# Patient Record
Sex: Female | Born: 1945
Health system: Southern US, Community
[De-identification: ages and names within clinical notes are randomized; demographics above are authoritative.]

## PROBLEM LIST (undated history)

## (undated) DIAGNOSIS — G4730 Sleep apnea, unspecified: Secondary | ICD-10-CM

## (undated) DIAGNOSIS — J189 Pneumonia, unspecified organism: Secondary | ICD-10-CM

## (undated) DIAGNOSIS — C50919 Malignant neoplasm of unspecified site of unspecified female breast: Secondary | ICD-10-CM

## (undated) DIAGNOSIS — F419 Anxiety disorder, unspecified: Secondary | ICD-10-CM

## (undated) DIAGNOSIS — I10 Essential (primary) hypertension: Secondary | ICD-10-CM

## (undated) DIAGNOSIS — J449 Chronic obstructive pulmonary disease, unspecified: Secondary | ICD-10-CM

## (undated) DIAGNOSIS — M1990 Unspecified osteoarthritis, unspecified site: Secondary | ICD-10-CM

## (undated) DIAGNOSIS — C50912 Malignant neoplasm of unspecified site of left female breast: Secondary | ICD-10-CM

## (undated) HISTORY — PX: VASCULAR SURGERY: SHX849

## (undated) HISTORY — PX: TRACHEOSTOMY: SUR1362

## (undated) HISTORY — PX: CHOLECYSTECTOMY: SHX55

## (undated) HISTORY — DX: Malignant neoplasm of unspecified site of unspecified female breast: C50.919

## (undated) HISTORY — PX: BREAST SURGERY: SHX581

## (undated) HISTORY — DX: Anxiety disorder, unspecified: F41.9

## (undated) HISTORY — DX: Essential (primary) hypertension: I10

## (undated) HISTORY — PX: ABDOMINAL HYSTERECTOMY: SHX81

## (undated) HISTORY — DX: Sleep apnea, unspecified: G47.30

## (undated) HISTORY — PX: OTHER SURGICAL HISTORY: SHX169

## (undated) HISTORY — DX: Malignant neoplasm of unspecified site of left female breast: C50.912

---

## 1999-02-28 ENCOUNTER — Ambulatory Visit (HOSPITAL_COMMUNITY): Admission: RE | Admit: 1999-02-28 | Discharge: 1999-02-28 | Payer: Self-pay | Admitting: Family Medicine

## 2000-08-28 ENCOUNTER — Ambulatory Visit (HOSPITAL_COMMUNITY): Admission: RE | Admit: 2000-08-28 | Discharge: 2000-08-28 | Payer: Self-pay | Admitting: *Deleted

## 2001-05-17 ENCOUNTER — Emergency Department (HOSPITAL_COMMUNITY): Admission: EM | Admit: 2001-05-17 | Discharge: 2001-05-17 | Payer: Self-pay | Admitting: Emergency Medicine

## 2002-01-13 ENCOUNTER — Encounter: Payer: Self-pay | Admitting: Emergency Medicine

## 2002-01-13 ENCOUNTER — Emergency Department (HOSPITAL_COMMUNITY): Admission: EM | Admit: 2002-01-13 | Discharge: 2002-01-13 | Payer: Self-pay | Admitting: Emergency Medicine

## 2002-01-20 ENCOUNTER — Emergency Department (HOSPITAL_COMMUNITY): Admission: EM | Admit: 2002-01-20 | Discharge: 2002-01-20 | Payer: Self-pay | Admitting: Emergency Medicine

## 2002-01-20 ENCOUNTER — Encounter: Payer: Self-pay | Admitting: Emergency Medicine

## 2002-06-24 ENCOUNTER — Emergency Department (HOSPITAL_COMMUNITY): Admission: EM | Admit: 2002-06-24 | Discharge: 2002-06-24 | Payer: Self-pay | Admitting: Emergency Medicine

## 2002-09-30 ENCOUNTER — Ambulatory Visit (HOSPITAL_COMMUNITY): Admission: RE | Admit: 2002-09-30 | Discharge: 2002-09-30 | Payer: Self-pay | Admitting: *Deleted

## 2004-07-17 ENCOUNTER — Other Ambulatory Visit: Admission: RE | Admit: 2004-07-17 | Discharge: 2004-07-17 | Payer: Self-pay | Admitting: Gynecology

## 2004-09-06 ENCOUNTER — Inpatient Hospital Stay (HOSPITAL_COMMUNITY): Admission: RE | Admit: 2004-09-06 | Discharge: 2004-09-08 | Payer: Self-pay | Admitting: Gynecology

## 2004-09-06 ENCOUNTER — Encounter (INDEPENDENT_AMBULATORY_CARE_PROVIDER_SITE_OTHER): Payer: Self-pay | Admitting: Specialist

## 2005-10-23 ENCOUNTER — Ambulatory Visit (HOSPITAL_COMMUNITY): Admission: RE | Admit: 2005-10-23 | Discharge: 2005-10-23 | Payer: Self-pay | Admitting: Internal Medicine

## 2006-04-03 ENCOUNTER — Other Ambulatory Visit: Admission: RE | Admit: 2006-04-03 | Discharge: 2006-04-03 | Payer: Self-pay | Admitting: Gynecology

## 2006-10-24 ENCOUNTER — Ambulatory Visit (HOSPITAL_COMMUNITY): Admission: RE | Admit: 2006-10-24 | Discharge: 2006-10-24 | Payer: Self-pay | Admitting: Internal Medicine

## 2007-11-05 ENCOUNTER — Ambulatory Visit (HOSPITAL_COMMUNITY): Admission: RE | Admit: 2007-11-05 | Discharge: 2007-11-05 | Payer: Self-pay | Admitting: Internal Medicine

## 2010-03-09 ENCOUNTER — Other Ambulatory Visit: Admission: RE | Admit: 2010-03-09 | Discharge: 2010-03-09 | Payer: Self-pay | Admitting: Internal Medicine

## 2011-03-02 NOTE — Discharge Summary (Signed)
NAMEJAHNI, PAUL               ACCOUNT NO.:  [id number]   MEDICAL RECORD NO.:  [id number]          PATIENT TYPE:  INP   LOCATION:  9317                          FACILITY:  [facility]   PHYSICIAN:  Ivor Costa. Farrel Gobble, M.D. DATE OF BIRTH:  Feb 13, 1946   DATE OF ADMISSION:  09/06/2004  DATE OF DISCHARGE:  09/08/2004                                 DISCHARGE SUMMARY   PRINCIPAL DIAGNOSIS:  Postmenopausal bleeding with endometrial defect.   ADDITIONAL DIAGNOSES:  Complex adnexal mass, cystocele, rectocele and  chronic hypertension.   PRINCIPAL PROCEDURE:  Laparoscopic assisted vaginal hysterectomy with  bilateral salpingo-oophorectomy.   ADDITIONAL PROCEDURES:  Anterior colporrhaphy, posterior colporrhaphy.   HOSPITAL COURSE:  Refer to dictated H&P for hospital course. The patient  presented on the morning of November 23 and underwent a laparoscopically  assisted vaginal hysterectomy with BSO, AP repair under general anesthesia  with an approximate estimated blood loss of 150 mL.  Operative findings was  a clear left ovarian mass that was mobile, an atrophic right ovary, slightly  enlarged uterus with smooth contours. The patient did well operatively,  extubated in the OR, transferred to the PACU in stable condition.  Her  postoperative course was uncomplicated. Because of patient desire, she was  started on food immediately, initially had some nausea but by postoperative  day #1, she was able to tolerate a regular diet.  By postoperative day #2,  the patient was ambulating without any difficulty. She had had scant to no  vaginal bleeding, she was tolerating a regular diet, her pain was well  controlled with oral pain medications and she was discharged home. The  patient was discharged home with instructions to followup in the office in  one week for a blood pressure check as her blood pressures did remain normal  while she was in house and she had never resumed her blood pressure  medication.   POSTOPERATIVE LABS:  Her hemoglobin was 10.8, hematocrit 32, platelets of  315 and white count of 18.5.   ADDITIONAL PRESCRIPTION:  She was given a prescription for Ambien 10 mg at  h.s. p.r.n. and she had been given a prescription for Tylox preoperatively.     Trac   THL/MEDQ  D:  09/08/2004  T:  09/08/2004  Job:  [id number]

## 2011-03-02 NOTE — H&P (Signed)
[name], Horn               ACCOUNT NO.:  [id number]   MEDICAL RECORD NO.:  [id number]          PATIENT TYPE:  INP   LOCATION:  NA                            FACILITY[facility]  [facility]   PHYSICIAN:  Ivor Costa. Farrel Gobble, M.D. DATE OF BIRTH:  Feb 13, 1946   DATE OF ADMISSION:  09/06/2004  DATE OF DISCHARGE:                                HISTORY & PHYSICAL   CHIEF COMPLAINT:  Postmenopausal bleeding with endometrial and adnexal  defects.   HISTORY OF PRESENT ILLNESS:  The patient is a 65 year old, G5, P1, with a 15  year history of menopause, who began having pinkish discharge for about the  past 2 years, never bleeding heavily.  She presented to the office with the  above complaint, and was found to have an endocervical polyp.  A polypectomy  was performed, but the biopsy came back consistent with an endometrial  polyp.  The patient, therefore, returned back to the office and had a  sonohystogram done.  Her ultrasound showed a slightly enlarged uterus for  menopausal status at 8.4 x 4.3 x 4.3, with a posterior wall defect that was  21 x 14 x 14mm.  In addition, the endometrium was 13.4 with cystic areas in  it, and calcium deposition.  The left ovarian tissue was unable to be seen,  but there was an adnexal mass that measured 4.7 x 3.7 x 3.1, with a small  focal defect of calcification.  The right adnexa was not visualized.  Based  on that, the patient presents for definitive surgery.  A CA125 was  performed, which was normal.   PAST OBSTETRIC AND GYNECOLOGIC HISTORY:  Menopause was spontaneous.  No  hormone replacement therapy.  Pap smears have all been normal.  She has one  spontaneous vaginal delivery.  She is currently not sexually active.   PAST MEDICAL HISTORY:  Significant for hypertension.  No diabetes or  coronary artery disease.   SURGICAL HISTORY:  1.  She had a cholecystectomy in 1988.  2.  A tracheotomy as an infant that was reversed.   MEDICATIONS:  1.  Microlis 40 mg  daily.  2.  Hydrochlorothiazide.   ALLERGIES:  Negative.   SOCIAL HISTORY:  She is a former 1-2 pack per day x30 year smoker who quit  about 3 years ago.  She drinks 1-2 caffeinated beverages a day.  No alcohol.  No formal exercise.   FAMILY HISTORY:  Negative for ovarian, uterine, or colon cancer.  There is  breast cancer in her mother in her 5s.  Her sister had lung cancer, but was  a smoker, as well.   PHYSICAL EXAMINATION:  GENERAL:  She is a well-appearing female in no acute  distress.  VITAL SIGNS:  Her height is 4 feet, 11 inches, weight 187.  HEART:  Regular rate.  LUNGS:  Clear to auscultation.  ABDOMEN:  Obese, soft, and nontender.  GYN:  She has postmenopausal changes to the external genitalia.  She has a  marked cystocele and rectocele.  Her cervix is parous.  On bimanual exam,  the uterus is slightly enlarged,  mobile, and nontender, as are the adnexa.  RECTOVAGINAL:  Consistent with the rectocele.   ASSESSMENT:  Endometrial defect, adnexal defect, with postmenopausal  bleeding.  Cystocele and rectocele.  The patient will present for an LAVH,  BSO, anterior posterior repair with washings.  She is aware if this turns  out to be malignant that she may require repeat surgery, and was agreeable.  All questions were addressed.     Trac   THL/MEDQ  D:  09/05/2004  T:  09/05/2004  Job:  [id number]

## 2011-03-02 NOTE — Op Note (Signed)
Christina Horn, Christina Horn               ACCOUNT NO.:  [id number]   MEDICAL RECORD NO.:  [id number]          PATIENT TYPE:  INP   LOCATION:  9399                          FACILITY:  [facility]   PHYSICIAN:  Ivor Costa. Farrel Gobble, M.D. DATE OF BIRTH:  03/21/1946   DATE OF PROCEDURE:  09/06/2004  DATE OF DISCHARGE:                                 OPERATIVE REPORT   PREOPERATIVE DIAGNOSES:  1.  Postmenopausal bleeding.  2.  Endometrial defect.  3.  Complex mass.  4.  Cystocele, rectocele.   POSTOPERATIVE DIAGNOSES:  1.  Postmenopausal bleeding.  2.  Endometrial defect.  3.  Complex mass.  4.  Cystocele, rectocele.   PROCEDURE:  1.  Laparoscopically assisted vaginal hysterectomy.  2.  Bilateral salpingo-oophorectomy.  3.  Anterior- posterior colporrhaphy.  4.  Pelvic washings.   SURGEON:  Ivor Costa. Farrel Gobble, M.D.   ASSISTANT:  Rande Brunt. Eda Paschal, M.D.   ANESTHESIA:  General.   IV FLUIDS:  3 liters lactated Ringer's.   ESTIMATED BLOOD LOSS:  Roughly 150 mL.   URINE OUTPUT:  250 mL of clear urine.   FINDINGS:  There was a clear left ovarian mass that was mobile, possible  serous cystadenoma. The right ovary was atrophic. The uterus was slightly  enlarged with smooth contours.   PATHOLOGY:  Uterus, tubes, ovaries and cervix as well as washings.   PACKS:  Vaginal.   COMPLICATIONS:  None.   DESCRIPTION OF PROCEDURE:  The patient was taken to the operating room,  general anesthesia was induced.  Prior to placement of general anesthesia,  however, the patient was positioned for her comfort because of some back  pain. She was then prepped and draped in the usual sterile fashion. A  bivalve speculum was placed in the vagina, the cervix was visualized and the  uterine manipulator was then placed. Gloves were changed and attention was  turned to the abdomen.  An infraumbilical incision was made with the scalpel  through which the Veress needle was inserted, opening pressure was 6,  pneumoperitoneum was created until tympany was appreciated above the liver  after which a #10/11 disposable trocar was inserted through the  infraumbilical port. Two lower 5 mm ports were then placed in the left and  right lower quadrants under direct visualization.  The pelvis was inspected,  the bowels were moved away, there was excessive bowel fat. The adnexal mass  was visualized.  The suction irrigator was then placed and approximately 200  mL of irrigant was placed into the pelvis and drawn off for permanent  washings.  The IP was then identified on the left hand side, the patient was  dextrorotated rotated in order to help visualize. It was cauterized and then  transected with the tripolar and hemostasis was assured. The pedicle was  then dissected off the side wall, posterior peritoneum was incised. The  round ligament was also similarly incised. Attention was then turned towards  the right, again the patient was rotated, the IP was identified and  similarly transected after cautery. The tube was inadvertently ripped and  then dissected off because  of bleeding and placed in the anterior cul-de-  sac.  The remainder of the posterior broad ligament was then incised and the  dissection carried through until the round which was similarly transected  after cautery. Because of redundant fat and prolapse, we were unable to  adequately make a bladder flap. The patient could not tolerate a steep  enough Trendelenburg position so attention was then turned vaginally. The  legs were elevated, a sterile weighted speculum was placed in the vagina,  the uterine manipulator was changed for a [employer] The vaginal mucosa was  then injected circumferentially with 1% lidocaine solution.  The vaginal  mucosa was then scored circumferentially around the cervix and gently  dissected off. The cervix was then deviated anteriorly and the posterior  colpotomy was performed. After the posterior colpotomy was  performed, a long  sterile weighted speculum was placed in the posterior cul-de-sac.  The  uterosacral ligaments were then transected and suture ligated with #0  Vicryl. The cardinal ligaments were similarly transected and suture ligated  with #0 Vicryl.  The dissection anteriorly continued cephalad as the uterine  vessels were similarly transected and suture ligated with #0 Vicryl.  An  anterior colpotomy was performed, placement in the abdomen was confirmed.  The residual pedicle was then grasped and transected.  This was done  bilaterally.  The specimen was passed off the field.  The pedicles were  inspected and noted to be hemostatic.  A small area of bleeding which was  mostly cuff was treated with cautery. We attempted to find the portion of  the tube that was previously excised and initially were unsuccessful. The  speculum was then removed from the posterior cul-de-sac and a  pneumoperitoneum was created up above so that we could find the portion of  the tube prior to closing of the cuff. During this time, we were fortunately  able to identify the tube and pass it off vaginally.  The posterior vaginal  mucosa was then plicated to the posterior peritoneum from uterosacral to  uterosacral ligament. The anterior vaginal mucosa was then grasped with  Allis clamps, the vaginal mucosa was injected with a dilute lidocaine  solution. The vaginal mucosa was then undermined and dissected off the  underlying pubovaginal fascia.  The vaginal mucosa was held and the  pubovaginal fascia was sharply dissected off the mucosa.  The pubovaginal  fascia was then plicated in the midline with 2-0 Vicryl.  The excessive  vaginal mucosa was then dissected off and the vaginal mucosa was  incorporated to underlying fascial line with 2-0 Vicryl.  Attention was then  turned back to the cuff. The cuff was closed in anterior posterior fashion with #0 Vicryl pop-offs from uterosacral to uterosacral. The  uterosacral  ligaments were then cut. Attention was then turned inferiorly. The vaginal  mucosa was grasped with Allis clamps, elevated and sharply dissected off  after injection with dilute lidocaine solution.  The vaginal mucosa was  dissected off the underlying rectovaginal fascia. There was noted to be  scant rectovaginal fascia on the most cephalad portion of the dissection,  however, good fascia was appreciated more inferiorly.  The vaginal  epithelium with some fascia was then reapproximated in the most cephalad  portions with 2-0 Vicryl with deviation of the rectocele until better fascia  was appreciated at which point the fascia was plicated in the midline in  multiple layers.  There was a small area of bleeding that was treated  successfully with  a single deep figure-of-eight. The excess vaginal mucosa  was then dissected off and the vaginal mucosa was reapproximated superiorly  and then once the good fascia was appreciated plicated to underlying fascia.  There was a vaginal skin tag that was appreciated during the case, this was  removed at the end sharply and passed off for permanent. The vagina was then  packed with gauze sponge with estrogen cream.  A pneumoperitoneum was then  recreated up above, the pelvis was inspected. It was irrigated and noted to  be hemostatic. The instruments were then removed from up above.  The  infraumbilical fascia was closed with #0 Vicryl  in a figure-of-eight, the skin was closed with 3-0 plain, the lower ports  were closed with tincture of Benzoin and Steri-Strips.  The patient  tolerated the procedure well. Sponge, lap and needle counts were correct x2.  She was transferred to the PACU in stable condition.     Trac   THL/MEDQ  D:  09/06/2004  T:  09/06/2004  Job:  [id number]

## 2011-05-02 ENCOUNTER — Other Ambulatory Visit: Payer: Self-pay | Admitting: Radiology

## 2011-05-03 ENCOUNTER — Other Ambulatory Visit: Payer: Self-pay | Admitting: Radiology

## 2011-05-03 DIAGNOSIS — C50912 Malignant neoplasm of unspecified site of left female breast: Secondary | ICD-10-CM

## 2011-05-04 ENCOUNTER — Ambulatory Visit
Admission: RE | Admit: 2011-05-04 | Discharge: 2011-05-04 | Disposition: A | Discharge status: Home / Self Care | Payer: Medicare Other | Source: Ambulatory Visit | Attending: Radiology | Admitting: Radiology

## 2011-05-04 DIAGNOSIS — C50912 Malignant neoplasm of unspecified site of left female breast: Secondary | ICD-10-CM

## 2011-05-04 MED ORDER — GADOBENATE DIMEGLUMINE 529 MG/ML IV SOLN
17.0000 mL | Freq: Once | INTRAVENOUS | Status: AC | PRN
  Administered 2011-05-04: 17 mL via INTRAVENOUS

## 2011-05-09 ENCOUNTER — Other Ambulatory Visit: Payer: Self-pay | Admitting: Oncology

## 2011-05-09 ENCOUNTER — Encounter (INDEPENDENT_AMBULATORY_CARE_PROVIDER_SITE_OTHER): Payer: Self-pay | Admitting: General Surgery

## 2011-05-09 ENCOUNTER — Ambulatory Visit (HOSPITAL_BASED_OUTPATIENT_CLINIC_OR_DEPARTMENT_OTHER): Payer: Medicare Other | Admitting: General Surgery

## 2011-05-09 ENCOUNTER — Encounter (HOSPITAL_BASED_OUTPATIENT_CLINIC_OR_DEPARTMENT_OTHER): Payer: Medicare Other | Admitting: Oncology

## 2011-05-09 DIAGNOSIS — C50119 Malignant neoplasm of central portion of unspecified female breast: Secondary | ICD-10-CM

## 2011-05-09 DIAGNOSIS — C50919 Malignant neoplasm of unspecified site of unspecified female breast: Secondary | ICD-10-CM

## 2011-05-09 DIAGNOSIS — C50512 Malignant neoplasm of lower-outer quadrant of left female breast: Secondary | ICD-10-CM | POA: Insufficient documentation

## 2011-05-09 DIAGNOSIS — Z170 Estrogen receptor positive status [ER+]: Secondary | ICD-10-CM

## 2011-05-09 DIAGNOSIS — C50912 Malignant neoplasm of unspecified site of left female breast: Secondary | ICD-10-CM

## 2011-05-09 HISTORY — DX: Malignant neoplasm of unspecified site of left female breast: C50.912

## 2011-05-09 LAB — COMPREHENSIVE METABOLIC PANEL
Albumin: 3.7 g/dL (ref 3.5–5.2)
BUN: 10 mg/dL (ref 6–23)
CO2: 32 mEq/L (ref 19–32)
Chloride: 100 mEq/L (ref 96–112)
Sodium: 140 mEq/L (ref 135–145)
Total Bilirubin: 0.3 mg/dL (ref 0.3–1.2)

## 2011-05-09 LAB — CBC WITH DIFFERENTIAL/PLATELET
BASO%: 0.2 % (ref 0.0–2.0)
Basophils Absolute: 0 10*3/uL (ref 0.0–0.1)
EOS%: 1.9 % (ref 0.0–7.0)
Eosinophils Absolute: 0.2 10*3/uL (ref 0.0–0.5)
MCH: 30.8 pg (ref 25.1–34.0)
MONO%: 8 % (ref 0.0–14.0)
NEUT#: 5.1 10*3/uL (ref 1.5–6.5)
RDW: 13 % (ref 11.2–14.5)
lymph#: 2.9 10*3/uL (ref 0.9–3.3)

## 2011-05-09 LAB — CANCER ANTIGEN 27.29: CA 27.29: 16 U/mL (ref 0–39)

## 2011-05-09 NOTE — Progress Notes (Signed)
Christina Horn is a 65 y.o. female.    Chief Complaint  Patient presents with  . Left Breast Cancer    HPI HPI She is referred by Dr. Rogelia Mire for newly diagnosed invasive left mammary carcinoma with lobular features. She was noted to have an area of new microcalcifications in the superior aspect of the left breast on her annual mammogram.  Large core biopsy showed the above pathology.  The lesion is ER/PR positive with a 40% proliferation rate.  She is asymptomatic with this. Her mother had breast cancer at the age of 31. An MRI was performed demonstrating a 5.9 cm abnormal attachment. She is here at the multidisciplinary breast clinic for further evaluation and treatment. No other major risk factors for breast cancer.  Past Medical History  Diagnosis Date  . Diabetes mellitus   . Hypertension   . Sleep apnea     Uses CPAP  . Anxiety     Past Surgical History  Procedure Date  . Cholecystectomy   . Abdominal hysterectomy   . Left leg vein striping   . Tracheostomy     Family History  Problem Relation Age of Onset  . Cancer Mother   . Cancer Sister   . Cancer Maternal Aunt   . Cancer Maternal Grandmother     Social History History  Substance Use Topics  . Smoking status: Never Smoker   . Smokeless tobacco: Not on file  . Alcohol Use: No    No Known Allergies  Current Outpatient Prescriptions  Medication Sig Dispense Refill  . amLODipine (NORVASC) 5 MG tablet Take 5 mg by mouth daily.        Marland Kitchen aspirin 81 MG tablet Take 81 mg by mouth daily.        . citalopram (CELEXA) 20 MG tablet Take 20 mg by mouth daily.        . fish oil-omega-3 fatty acids 1000 MG capsule Take 2 g by mouth daily.        . hydrochlorothiazide 25 MG tablet Take 25 mg by mouth daily.        . Melatonin 1 MG CAPS Take by mouth 1 day or 1 dose.        . metFORMIN (GLUCOPHAGE) 500 MG tablet Take 500 mg by mouth 1 day or 1 dose. Take 1/2 once a day       . VITAMIN D, CHOLECALCIFEROL, PO Take  by mouth.          Review of Systems ROS General:  __Normal __Fever__Weight change __Night sweats __Fatigue _X_Sleep loss  Breast:  _X_Normal __Lump __Pain __Nipple discharge __Infection __Other  Infectious Diseases:  _X_Normal __HIV/AIDS __Tuberculosis __Hepatitis A       __ Hepatitis B __Hepatitis C __ STD __MRSA(staph infection) __Other  Dental:  _X_Normal __Dentures __Other  Cardiac  :_X Normal __Pacemaker __Defibrillator __Bypass surgery _X_High blood pressure __ Peripheral vascular disease __Heart attack __Irregular heart beat __Valve       disease  Pulmonary:  __Normal __Cough/Sputum __Bronchitis __Asthma __COPD                    _X_Short of breath __Pneumonia _X_Sleep Apnea  Endocrine:  __Normal _X_Diabetes __Thyroid disease __High Cholesterol __Other  Skin:  _X_Normal  __Rash __Bruise easily __Cancer __Abnormal moles __Other  Gastrointestinal:  _X_Normal __Nausea/Vomiting/Diarrhea __Colon polyp or Cancer       __Irritable Bowel disease __Poor appetite __Hiatal hernia or reflux __Ulcer       __Liver  disease __Abdominal pain __Hernia __Rectal bleeding or hemorrhoids       __Other  Genitourinary:  __Normal __Kidney disease or stones __Prostate problems        __Blood in urine __Difficulty voiding X__Incontinence/leakage __Other  Neurological:  _X_Normal __Stroke __Paralysis __Seizure __Alzheimers disease       __Headaches __Dizziness __Fainting __Weakness __Other  Hematologic/Lymphatic:  _X_Normal __Bleeding disorder __Blood clots __Anemia       __Swollen lymph nodes __Blood Transfusion __Other  Immune System:  _X_Normal __Previous/Current Cancer-type:  __Other  HEENT:  __Normal __Hearing loss  __Hearing aid __Ear infection __Nose bleed       __Hoarseness __Sore throat __Blurred or double vision _X_Glasses or contacts       __Glaucoma __Retinopathy __Macular degeneration __Other  Musculoskeletal:  _X_Normal __Joint pain __Arthritis __Other        Physical  Exam Physical Exam  Constitutional: No distress.       Obese.  HENT:  Head: Normocephalic and atraumatic.  Eyes: Conjunctivae and EOM are normal.  Neck: Neck supple. No JVD present.       Lower neck scar.  Cardiovascular: Normal rate and regular rhythm.   No murmur heard. Respiratory: Effort normal and breath sounds normal. She has no wheezes.       Right breast-no masses, suspicious skin changes, or nipple discharge.  Left breast-small lateral puncture wound. No masses,  suspicious skin changes, or nipple discharge.  GI: Soft. Bowel sounds are normal. She exhibits no mass. There is no tenderness.       RUQ scar.  Musculoskeletal: She exhibits no edema and no tenderness.  Neurological: She is alert.  Skin: Skin is warm and dry. No rash noted.  Psychiatric: She has a normal mood and affect. Her behavior is normal. Judgment normal.     There were no vitals taken for this visit.  Assessment/Plan Invasive left breast cancer. Patient desires to proceed with a left mastectomy. She is not interested in neoadjuvant therapy.  Plan: Left mastectomy and sentinel lymph node biopsy.  I have explained the procedure, risks, and aftercare to her.  Risks include but are not limited to bleeding, infection, wound problems, anesthesia, chronic chest wall pain, nerve injury, seroma formation, lymphedema.  She seems to understand and agrees with the plan.  Dairon Procter J 05/09/2011, 11:17 AM

## 2011-05-10 ENCOUNTER — Other Ambulatory Visit (INDEPENDENT_AMBULATORY_CARE_PROVIDER_SITE_OTHER): Payer: Self-pay | Admitting: General Surgery

## 2011-05-10 DIAGNOSIS — C50912 Malignant neoplasm of unspecified site of left female breast: Secondary | ICD-10-CM

## 2011-05-11 ENCOUNTER — Encounter (HOSPITAL_COMMUNITY)
Admission: RE | Admit: 2011-05-11 | Discharge: 2011-05-11 | Disposition: A | Discharge status: Home / Self Care | Payer: Medicare Other | Source: Ambulatory Visit | Attending: Oncology | Admitting: Oncology

## 2011-05-11 DIAGNOSIS — I517 Cardiomegaly: Secondary | ICD-10-CM | POA: Insufficient documentation

## 2011-05-11 DIAGNOSIS — I2510 Atherosclerotic heart disease of native coronary artery without angina pectoris: Secondary | ICD-10-CM | POA: Insufficient documentation

## 2011-05-11 DIAGNOSIS — C50919 Malignant neoplasm of unspecified site of unspecified female breast: Secondary | ICD-10-CM

## 2011-05-11 DIAGNOSIS — M47814 Spondylosis without myelopathy or radiculopathy, thoracic region: Secondary | ICD-10-CM | POA: Insufficient documentation

## 2011-05-11 MED ORDER — IOHEXOL 300 MG/ML  SOLN
80.0000 mL | Freq: Once | INTRAMUSCULAR | Status: AC | PRN
  Administered 2011-05-11: 80 mL via INTRAVENOUS

## 2011-05-11 MED ORDER — TECHNETIUM TC 99M MEDRONATE IV KIT
23.7000 | PACK | Freq: Once | INTRAVENOUS | Status: AC | PRN
  Administered 2011-05-11: 24 via INTRAVENOUS

## 2011-05-28 ENCOUNTER — Encounter (HOSPITAL_COMMUNITY)
Admission: RE | Admit: 2011-05-28 | Discharge: 2011-05-28 | Disposition: A | Discharge status: Home / Self Care | Payer: Medicare Other | Source: Ambulatory Visit | Attending: General Surgery | Admitting: General Surgery

## 2011-05-28 ENCOUNTER — Other Ambulatory Visit (INDEPENDENT_AMBULATORY_CARE_PROVIDER_SITE_OTHER): Payer: Self-pay | Admitting: General Surgery

## 2011-05-28 ENCOUNTER — Ambulatory Visit (HOSPITAL_COMMUNITY)
Admission: RE | Admit: 2011-05-28 | Discharge: 2011-05-28 | Disposition: A | Discharge status: Home / Self Care | Payer: Medicare Other | Source: Ambulatory Visit | Attending: General Surgery | Admitting: General Surgery

## 2011-05-28 DIAGNOSIS — Z01811 Encounter for preprocedural respiratory examination: Secondary | ICD-10-CM

## 2011-05-28 DIAGNOSIS — Z01810 Encounter for preprocedural cardiovascular examination: Secondary | ICD-10-CM | POA: Insufficient documentation

## 2011-05-28 DIAGNOSIS — I10 Essential (primary) hypertension: Secondary | ICD-10-CM | POA: Insufficient documentation

## 2011-05-28 DIAGNOSIS — I517 Cardiomegaly: Secondary | ICD-10-CM | POA: Insufficient documentation

## 2011-05-28 DIAGNOSIS — C50919 Malignant neoplasm of unspecified site of unspecified female breast: Secondary | ICD-10-CM | POA: Insufficient documentation

## 2011-05-28 DIAGNOSIS — Z01812 Encounter for preprocedural laboratory examination: Secondary | ICD-10-CM | POA: Insufficient documentation

## 2011-05-28 DIAGNOSIS — Z01818 Encounter for other preprocedural examination: Secondary | ICD-10-CM | POA: Insufficient documentation

## 2011-05-28 LAB — DIFFERENTIAL
Basophils Relative: 1 % (ref 0–1)
Eosinophils Absolute: 0.2 10*3/uL (ref 0.0–0.7)
Eosinophils Relative: 2 % (ref 0–5)
Lymphocytes Relative: 29 % (ref 12–46)
Monocytes Absolute: 0.9 10*3/uL (ref 0.1–1.0)
Neutrophils Relative %: 61 % (ref 43–77)

## 2011-05-28 LAB — CBC
HCT: 40.6 % (ref 36.0–46.0)
MCH: 31.5 pg (ref 26.0–34.0)
MCV: 92.7 fL (ref 78.0–100.0)
Platelets: 305 10*3/uL (ref 150–400)
RDW: 12.9 % (ref 11.5–15.5)
WBC: 10.8 10*3/uL — ABNORMAL HIGH (ref 4.0–10.5)

## 2011-05-28 LAB — PROTIME-INR: Prothrombin Time: 13.7 seconds (ref 11.6–15.2)

## 2011-05-28 LAB — COMPREHENSIVE METABOLIC PANEL
Albumin: 3.7 g/dL (ref 3.5–5.2)
Calcium: 9.9 mg/dL (ref 8.4–10.5)
GFR calc non Af Amer: >60 mL/min (ref >60)
Glucose, Bld: 87 mg/dL (ref 70–99)
Sodium: 143 mEq/L (ref 135–145)
Total Bilirubin: 0.3 mg/dL (ref 0.3–1.2)
Total Protein: 6.7 g/dL (ref 6.0–8.3)

## 2011-05-28 LAB — SURGICAL PCR SCREEN
MRSA, PCR: NEGATIVE
Staphylococcus aureus: NEGATIVE

## 2011-06-07 ENCOUNTER — Other Ambulatory Visit (INDEPENDENT_AMBULATORY_CARE_PROVIDER_SITE_OTHER): Payer: Self-pay | Admitting: General Surgery

## 2011-06-07 ENCOUNTER — Ambulatory Visit (HOSPITAL_COMMUNITY)
Admission: RE | Admit: 2011-06-07 | Discharge: 2011-06-07 | Disposition: A | Discharge status: Home / Self Care | Payer: Medicare Other | Source: Ambulatory Visit | Attending: General Surgery | Admitting: General Surgery

## 2011-06-07 ENCOUNTER — Ambulatory Visit (HOSPITAL_COMMUNITY)
Admission: RE | Admit: 2011-06-07 | Discharge: 2011-06-08 | Disposition: A | Discharge status: Home / Self Care | Payer: Medicare Other | Source: Ambulatory Visit | Attending: General Surgery | Admitting: General Surgery

## 2011-06-07 DIAGNOSIS — Z01810 Encounter for preprocedural cardiovascular examination: Secondary | ICD-10-CM | POA: Insufficient documentation

## 2011-06-07 DIAGNOSIS — Z01818 Encounter for other preprocedural examination: Secondary | ICD-10-CM | POA: Insufficient documentation

## 2011-06-07 DIAGNOSIS — E669 Obesity, unspecified: Secondary | ICD-10-CM | POA: Insufficient documentation

## 2011-06-07 DIAGNOSIS — Z01812 Encounter for preprocedural laboratory examination: Secondary | ICD-10-CM | POA: Insufficient documentation

## 2011-06-07 DIAGNOSIS — C50919 Malignant neoplasm of unspecified site of unspecified female breast: Secondary | ICD-10-CM

## 2011-06-07 DIAGNOSIS — C50912 Malignant neoplasm of unspecified site of left female breast: Secondary | ICD-10-CM

## 2011-06-07 DIAGNOSIS — I10 Essential (primary) hypertension: Secondary | ICD-10-CM | POA: Insufficient documentation

## 2011-06-07 HISTORY — PX: MASTECTOMY: SHX3

## 2011-06-07 LAB — TYPE AND SCREEN: Antibody Screen: NEGATIVE

## 2011-06-07 MED ORDER — TECHNETIUM TC 99M SULFUR COLLOID FILTERED
1.0000 | Freq: Once | INTRAVENOUS | Status: AC | PRN
  Administered 2011-06-07: 1 via INTRADERMAL

## 2011-06-08 LAB — GLUCOSE, CAPILLARY: Glucose-Capillary: 126 mg/dL — ABNORMAL HIGH (ref 70–99)

## 2011-06-08 LAB — ABO/RH: ABO/RH(D): O POS

## 2011-06-12 ENCOUNTER — Encounter (INDEPENDENT_AMBULATORY_CARE_PROVIDER_SITE_OTHER): Payer: Self-pay | Admitting: General Surgery

## 2011-06-12 ENCOUNTER — Ambulatory Visit (INDEPENDENT_AMBULATORY_CARE_PROVIDER_SITE_OTHER): Payer: Medicare Other | Admitting: General Surgery

## 2011-06-12 VITALS — BP 142/78 | HR 60

## 2011-06-12 DIAGNOSIS — C50919 Malignant neoplasm of unspecified site of unspecified female breast: Secondary | ICD-10-CM

## 2011-06-12 NOTE — Progress Notes (Signed)
Operation: Left mastectomy and sentinel lymph node biopsy  Date: 06/07/2011  Pathology: pending  HPI:  Christina Horn is here for a postoperative visit. No significant pain in the chest or axillary incisions.  There are 2 drains present. One drain output is over 30 cc a day, the other drain is putting out minimal.  PE: Left chest incision is clean and intact with Steri-Strips present. Left axilla incisions clean and intact with Steri-Strips present. Output from drainage is thin and mostly serous.  Assessment:  Wounds healing well. Lateral drain is ready to be removed.  Plan:  Remove lateral drain. Await pathology results. When medial drain output is less than 30 cc will have her call back for appointment so we can remove this. We also scheduled return visit in 2-3 weeks.

## 2011-06-12 NOTE — Patient Instructions (Signed)
Call when drain output is less than 30 cc per day for 2 days in a row.

## 2011-06-13 ENCOUNTER — Telehealth (INDEPENDENT_AMBULATORY_CARE_PROVIDER_SITE_OTHER): Payer: Self-pay | Admitting: General Surgery

## 2011-06-13 NOTE — Telephone Encounter (Signed)
I spoke with her about her pathology. One out of 7 lymph nodes were positive. Margins from the mastectomy were clear. She has an appointment with oncology in about 3 weeks. I told her they would talk with her about further treatment. She seemed to understand this.

## 2011-06-14 NOTE — Op Note (Signed)
Christina Horn, Christina Horn NO.:  [id number]  MEDICAL RECORD NO.:  [id number]  LOCATION:  5118                         FACILITY:  [facility]  PHYSICIAN:  Adolph Pollack, M.D.DATE OF BIRTH:  [birth year]  DATE OF PROCEDURE:  06/07/2011 DATE OF DISCHARGE:  05/28/2011                              OPERATIVE REPORT   PREOPERATIVE DIAGNOSIS:  Invasive cancer of left breast.  POSTOPERATIVE DIAGNOSIS:  Invasive cancer of left breast.  PROCEDURES: 1. Lymphatic mapping left axilla. 2. Left axillary sentinel lymph node biopsy (5 sentinel lymph nodes). 3. Left mastectomy.  SURGEON:  Adolph Pollack, MD  ANESTHESIA:  General.  INDICATION:  [name] is a 65 year old female with an abnormality on mammography.  She underwent a image-guided biopsy demonstrated invasive breast cancer.  She wanted to proceed with a left mastectomy as she was not interested in breast conservation or neoadjuvant therapy and now presents for the above procedures.  TECHNIQUE:  She was seen in the holding area and left for Esmarch in my initials.  She then had circumareolar radioactive injection performed sterilely.  Following this, she was brought to the operating room, placed supine on the operating table and general anesthetic was administered.  The left breast and chest axillary area of her left arm was sterilely prepped and draped.  Using the NeoProbe, I placed within the axilla and mapped an area of increased counts.  I made a transverse incision in the lower axillary area dividing the subcutaneous tissue with electrocautery.  Using a NeoProbe, I entered the axillary content area.  I identified a node with increased counts and grasped this and dissected it free using electrocautery and removed it as sentinel lymph node #1.  I reintroduced the probe again to the axilla and found another area of increased counts and another hot lymph node removed this. I did this 3 more times, finding a  total of 5 sentinel lymph nodes.  I did find one lymph node that was not hot and sent it separately as axillary content.  All the lymph nodes were then sent to pathology.  Following this, I injected the wound with Marcaine and irrigated and inspected and hemostasis was adequate.  The wound was then closed in 2 layers.  The subcutaneous tissue was closed with interrupted 3-0 Vicryl suture.  The skin was closed with 4-0 Monocryl subcuticular stitch. Steri-Strips and dry sterile dressings were applied.  Next, I approached the left breast.  I made an elliptical incision through the skin and dermis to include the nipple areolar complex.  Then using electrocautery, I raised skin and thin subcutaneous flaps to the clavicle superiorly, sternum medially, the anterior rectus sheath inferiorly, and latissimus muscle laterally.  I then dissected the breast tissue free from the chest wall using electrocautery.  Bleeding was controlled with electrocautery.  The medial aspect of the specimens marked with suture and then sent to pathology.  I then irrigated the wound, inspected it and bleeding points were again identified and controlled with electrocautery.  Further inspection demonstrated adequate hemostasis.  Following this, 2 stab wounds were then placed in the inferior flap.  I then placed a 19-Blake drain laterally and one medially and  sewn to the skin with 3-0 nylon suture.  I then closed the wound in 2 layers.  The subcutaneous tissue was then approximated with 3-0 Vicryl sutures.  The skin was closed with a 4-0 Monocryl subcuticular stitch.  Steri-Strips and sterile dressings were applied to both wounds.  She tolerated the procedures well without any apparent complications and was taken to recovery room in satisfactory condition.     Adolph Pollack, M.D.     Kari Baars  D:  06/07/2011  T:  06/07/2011  Job:  [id number]  cc:   Pierce Crane, M.D., F.R.C.P.C. Lurline Hare, M.D. Massie Maroon, MD Rogelia Mire, MD  Electronically Signed by Avel Peace M.D. on 06/14/2011 02:20:41 PM

## 2011-06-19 ENCOUNTER — Encounter (INDEPENDENT_AMBULATORY_CARE_PROVIDER_SITE_OTHER): Payer: Medicare Other

## 2011-06-20 ENCOUNTER — Encounter (INDEPENDENT_AMBULATORY_CARE_PROVIDER_SITE_OTHER): Payer: Medicare Other

## 2011-06-28 ENCOUNTER — Other Ambulatory Visit: Payer: Self-pay | Admitting: Oncology

## 2011-06-28 ENCOUNTER — Encounter (HOSPITAL_BASED_OUTPATIENT_CLINIC_OR_DEPARTMENT_OTHER): Payer: Medicare Other | Admitting: Oncology

## 2011-06-28 DIAGNOSIS — C50119 Malignant neoplasm of central portion of unspecified female breast: Secondary | ICD-10-CM

## 2011-06-28 LAB — CBC WITH DIFFERENTIAL/PLATELET
BASO%: 0.5 % (ref 0.0–2.0)
Eosinophils Absolute: 0.4 10*3/uL (ref 0.0–0.5)
MCHC: 33.7 g/dL (ref 31.5–36.0)
MONO#: 0.7 10*3/uL (ref 0.1–0.9)
MONO%: 8.3 % (ref 0.0–14.0)
NEUT#: 4.2 10*3/uL (ref 1.5–6.5)
RBC: 4.02 10*6/uL (ref 3.70–5.45)
RDW: 13.5 % (ref 11.2–14.5)
WBC: 8.3 10*3/uL (ref 3.9–10.3)

## 2011-06-28 LAB — COMPREHENSIVE METABOLIC PANEL
ALT: 15 U/L (ref 0–35)
Albumin: 3.5 g/dL (ref 3.5–5.2)
Alkaline Phosphatase: 66 U/L (ref 39–117)
CO2: 30 mEq/L (ref 19–32)
Glucose, Bld: 87 mg/dL (ref 70–99)
Potassium: 2.9 mEq/L — ABNORMAL LOW (ref 3.5–5.3)
Sodium: 140 mEq/L (ref 135–145)
Total Protein: 6.8 g/dL (ref 6.0–8.3)

## 2011-06-29 LAB — VITAMIN D 25 HYDROXY (VIT D DEFICIENCY, FRACTURES): Vit D, 25-Hydroxy: 37 ng/mL (ref 30–89)

## 2011-07-04 ENCOUNTER — Encounter (HOSPITAL_BASED_OUTPATIENT_CLINIC_OR_DEPARTMENT_OTHER): Payer: Medicare Other | Admitting: Oncology

## 2011-07-04 ENCOUNTER — Encounter (INDEPENDENT_AMBULATORY_CARE_PROVIDER_SITE_OTHER): Payer: Self-pay | Admitting: General Surgery

## 2011-07-04 ENCOUNTER — Ambulatory Visit (INDEPENDENT_AMBULATORY_CARE_PROVIDER_SITE_OTHER): Payer: Medicare Other | Admitting: General Surgery

## 2011-07-04 ENCOUNTER — Other Ambulatory Visit: Payer: Self-pay | Admitting: Oncology

## 2011-07-04 VITALS — BP 142/70 | HR 64 | Temp 98.0°F | Resp 16 | Ht 59.0 in | Wt 184.8 lb

## 2011-07-04 DIAGNOSIS — C50119 Malignant neoplasm of central portion of unspecified female breast: Secondary | ICD-10-CM

## 2011-07-04 DIAGNOSIS — C50919 Malignant neoplasm of unspecified site of unspecified female breast: Secondary | ICD-10-CM

## 2011-07-04 DIAGNOSIS — C50912 Malignant neoplasm of unspecified site of left female breast: Secondary | ICD-10-CM

## 2011-07-04 LAB — CBC WITH DIFFERENTIAL/PLATELET
BASO%: 0.5 % (ref 0.0–2.0)
Basophils Absolute: 0 10*3/uL (ref 0.0–0.1)
EOS%: 4.6 % (ref 0.0–7.0)
HCT: 38.6 % (ref 34.8–46.6)
HGB: 12.7 g/dL (ref 11.6–15.9)
LYMPH%: 34.1 % (ref 14.0–49.7)
MCH: 30.8 pg (ref 25.1–34.0)
MCHC: 32.9 g/dL (ref 31.5–36.0)
MCV: 93.5 fL (ref 79.5–101.0)
MONO%: 6.7 % (ref 0.0–14.0)
NEUT%: 54.1 % (ref 38.4–76.8)
lymph#: 2.8 10*3/uL (ref 0.9–3.3)

## 2011-07-04 LAB — COMPREHENSIVE METABOLIC PANEL
ALT: 18 U/L (ref 0–35)
AST: 17 U/L (ref 0–37)
Alkaline Phosphatase: 57 U/L (ref 39–117)
BUN: 11 mg/dL (ref 6–23)
Calcium: 9.6 mg/dL (ref 8.4–10.5)
Chloride: 101 mEq/L (ref 96–112)
Creatinine, Ser: 0.71 mg/dL (ref 0.50–1.10)
Total Bilirubin: 0.3 mg/dL (ref 0.3–1.2)

## 2011-07-04 NOTE — Patient Instructions (Signed)
-  Resume normal activities

## 2011-07-04 NOTE — Progress Notes (Signed)
Operation: Left mastectomy and sentinel lymph node biopsy  Date: 06/07/2011  Pathology:   T3N1a  HPI:  Christina Horn is here for another postoperative visit. No significant pain in the chest or axillary incisions.  Here drains are out.  Having some parethesias around the chest wall incision.  PE: Left chest incision is clean and intact with no significant swelling. Left axilla incision clean and intact.   Assessment:  Wounds are continuing to heal well.   She is due to see Dr. Caron Presume today to talk about further treatment.   Plan:  Resume normal activities. Will try to get her into the after mastectomy exercise class. Return visit 4 weeks. If she needs a Port-A-Cath for chemotherapy we will be able to do that for her.  The procedure risks and aftercare been explained. Risks include but are not limited to bleeding, infection, malfunction, pneumothorax, wound problems, DVT.

## 2011-07-10 ENCOUNTER — Encounter (HOSPITAL_BASED_OUTPATIENT_CLINIC_OR_DEPARTMENT_OTHER): Payer: Medicare Other | Admitting: Oncology

## 2011-07-10 DIAGNOSIS — Z5111 Encounter for antineoplastic chemotherapy: Secondary | ICD-10-CM

## 2011-07-10 DIAGNOSIS — C50419 Malignant neoplasm of upper-outer quadrant of unspecified female breast: Secondary | ICD-10-CM

## 2011-07-10 DIAGNOSIS — C50919 Malignant neoplasm of unspecified site of unspecified female breast: Secondary | ICD-10-CM

## 2011-07-11 ENCOUNTER — Encounter (HOSPITAL_BASED_OUTPATIENT_CLINIC_OR_DEPARTMENT_OTHER): Payer: Medicare Other | Admitting: Oncology

## 2011-07-11 DIAGNOSIS — C50419 Malignant neoplasm of upper-outer quadrant of unspecified female breast: Secondary | ICD-10-CM

## 2011-07-11 DIAGNOSIS — Z5189 Encounter for other specified aftercare: Secondary | ICD-10-CM

## 2011-07-11 DIAGNOSIS — C50919 Malignant neoplasm of unspecified site of unspecified female breast: Secondary | ICD-10-CM

## 2011-07-17 ENCOUNTER — Other Ambulatory Visit: Payer: Self-pay | Admitting: Oncology

## 2011-07-17 ENCOUNTER — Encounter (HOSPITAL_BASED_OUTPATIENT_CLINIC_OR_DEPARTMENT_OTHER): Payer: Medicare Other | Admitting: Oncology

## 2011-07-17 DIAGNOSIS — C50119 Malignant neoplasm of central portion of unspecified female breast: Secondary | ICD-10-CM

## 2011-07-17 DIAGNOSIS — C50919 Malignant neoplasm of unspecified site of unspecified female breast: Secondary | ICD-10-CM

## 2011-07-17 DIAGNOSIS — Z170 Estrogen receptor positive status [ER+]: Secondary | ICD-10-CM

## 2011-07-17 LAB — CBC WITH DIFFERENTIAL/PLATELET
Basophils Absolute: 0 10*3/uL (ref 0.0–0.1)
EOS%: 2.1 % (ref 0.0–7.0)
Eosinophils Absolute: 0.1 10*3/uL (ref 0.0–0.5)
HCT: 36.6 % (ref 34.8–46.6)
HGB: 12.3 g/dL (ref 11.6–15.9)
MCH: 31.8 pg (ref 25.1–34.0)
MCV: 95 fL (ref 79.5–101.0)
MONO%: 1.5 % (ref 0.0–14.0)
NEUT#: 3.9 10*3/uL (ref 1.5–6.5)
NEUT%: 60.2 % (ref 38.4–76.8)
RDW: 13.3 % (ref 11.2–14.5)

## 2011-07-18 ENCOUNTER — Encounter: Payer: Self-pay | Admitting: *Deleted

## 2011-07-25 ENCOUNTER — Encounter: Payer: Self-pay | Admitting: *Deleted

## 2011-07-31 ENCOUNTER — Encounter (HOSPITAL_BASED_OUTPATIENT_CLINIC_OR_DEPARTMENT_OTHER): Payer: Medicare Other | Admitting: Oncology

## 2011-07-31 ENCOUNTER — Other Ambulatory Visit: Payer: Self-pay | Admitting: Physician Assistant

## 2011-07-31 DIAGNOSIS — C50419 Malignant neoplasm of upper-outer quadrant of unspecified female breast: Secondary | ICD-10-CM

## 2011-07-31 DIAGNOSIS — C50919 Malignant neoplasm of unspecified site of unspecified female breast: Secondary | ICD-10-CM

## 2011-07-31 DIAGNOSIS — Z170 Estrogen receptor positive status [ER+]: Secondary | ICD-10-CM

## 2011-07-31 DIAGNOSIS — C50119 Malignant neoplasm of central portion of unspecified female breast: Secondary | ICD-10-CM

## 2011-07-31 DIAGNOSIS — Z5111 Encounter for antineoplastic chemotherapy: Secondary | ICD-10-CM

## 2011-07-31 LAB — COMPREHENSIVE METABOLIC PANEL
Alkaline Phosphatase: 74 U/L (ref 39–117)
CO2: 23 mEq/L (ref 19–32)
Creatinine, Ser: 0.75 mg/dL (ref 0.50–1.10)
Glucose, Bld: 168 mg/dL — ABNORMAL HIGH (ref 70–99)
Total Bilirubin: 0.3 mg/dL (ref 0.3–1.2)

## 2011-07-31 LAB — CBC WITH DIFFERENTIAL/PLATELET
Basophils Absolute: 0 10*3/uL (ref 0.0–0.1)
EOS%: 0 % (ref 0.0–7.0)
Eosinophils Absolute: 0 10*3/uL (ref 0.0–0.5)
HGB: 12.1 g/dL (ref 11.6–15.9)
LYMPH%: 8.2 % — ABNORMAL LOW (ref 14.0–49.7)
MCH: 31 pg (ref 25.1–34.0)
MCV: 94.9 fL (ref 79.5–101.0)
MONO%: 3.8 % (ref 0.0–14.0)
NEUT#: 19.7 10*3/uL — ABNORMAL HIGH (ref 1.5–6.5)
Platelets: 491 10*3/uL — ABNORMAL HIGH (ref 145–400)
RBC: 3.9 10*6/uL (ref 3.70–5.45)

## 2011-08-01 ENCOUNTER — Encounter (HOSPITAL_BASED_OUTPATIENT_CLINIC_OR_DEPARTMENT_OTHER): Payer: Medicare Other | Admitting: Oncology

## 2011-08-01 DIAGNOSIS — C50419 Malignant neoplasm of upper-outer quadrant of unspecified female breast: Secondary | ICD-10-CM

## 2011-08-01 DIAGNOSIS — C50919 Malignant neoplasm of unspecified site of unspecified female breast: Secondary | ICD-10-CM

## 2011-08-07 ENCOUNTER — Encounter (INDEPENDENT_AMBULATORY_CARE_PROVIDER_SITE_OTHER): Payer: Self-pay | Admitting: General Surgery

## 2011-08-07 ENCOUNTER — Other Ambulatory Visit: Payer: Self-pay | Admitting: Physician Assistant

## 2011-08-07 ENCOUNTER — Encounter (HOSPITAL_BASED_OUTPATIENT_CLINIC_OR_DEPARTMENT_OTHER): Payer: Medicare Other | Admitting: Oncology

## 2011-08-07 ENCOUNTER — Ambulatory Visit (INDEPENDENT_AMBULATORY_CARE_PROVIDER_SITE_OTHER): Payer: Medicare Other | Admitting: General Surgery

## 2011-08-07 VITALS — BP 130/64 | HR 60 | Temp 97.0°F | Resp 16 | Ht 59.0 in | Wt 178.1 lb

## 2011-08-07 DIAGNOSIS — C50419 Malignant neoplasm of upper-outer quadrant of unspecified female breast: Secondary | ICD-10-CM

## 2011-08-07 DIAGNOSIS — Z5111 Encounter for antineoplastic chemotherapy: Secondary | ICD-10-CM

## 2011-08-07 DIAGNOSIS — C50919 Malignant neoplasm of unspecified site of unspecified female breast: Secondary | ICD-10-CM

## 2011-08-07 DIAGNOSIS — C50119 Malignant neoplasm of central portion of unspecified female breast: Secondary | ICD-10-CM

## 2011-08-07 LAB — CBC WITH DIFFERENTIAL/PLATELET
Basophils Absolute: 0.2 10*3/uL — ABNORMAL HIGH (ref 0.0–0.1)
Eosinophils Absolute: 0.3 10*3/uL (ref 0.0–0.5)
HCT: 36.5 % (ref 34.8–46.6)
HGB: 11.9 g/dL (ref 11.6–15.9)
LYMPH%: 39.3 % (ref 14.0–49.7)
MCV: 94.6 fL (ref 79.5–101.0)
MONO#: 2.4 10*3/uL — ABNORMAL HIGH (ref 0.1–0.9)
NEUT#: 1.4 10*3/uL — ABNORMAL LOW (ref 1.5–6.5)
NEUT%: 19.4 % — ABNORMAL LOW (ref 38.4–76.8)
Platelets: 237 10*3/uL (ref 145–400)
WBC: 7 10*3/uL (ref 3.9–10.3)
nRBC: 1 % — ABNORMAL HIGH (ref 0–0)

## 2011-08-07 NOTE — Progress Notes (Signed)
Operation: Left mastectomy and sentinel lymph node biopsy  Date: 06/07/2011  Pathology:   T3N1a  HPI:  Christina Horn is here for another postoperative visit. Her chemotherapy has started and she is tolerating it fairly well.  She has lost her hair.  PE: Left chest and axilla incisions and clean, dry, and intact.  Assessment:  Left breast cancer s/p mastectomy and SLNBx- wounds healing well.  Plan: Return visit in three months.

## 2011-08-20 ENCOUNTER — Other Ambulatory Visit: Payer: Self-pay | Admitting: Oncology

## 2011-08-20 ENCOUNTER — Encounter: Payer: Self-pay | Admitting: Oncology

## 2011-08-20 DIAGNOSIS — C50919 Malignant neoplasm of unspecified site of unspecified female breast: Secondary | ICD-10-CM

## 2011-08-21 ENCOUNTER — Other Ambulatory Visit: Payer: Self-pay | Admitting: Oncology

## 2011-08-21 ENCOUNTER — Other Ambulatory Visit: Payer: Self-pay | Admitting: Physician Assistant

## 2011-08-21 ENCOUNTER — Encounter: Payer: Self-pay | Admitting: Oncology

## 2011-08-21 ENCOUNTER — Ambulatory Visit (HOSPITAL_BASED_OUTPATIENT_CLINIC_OR_DEPARTMENT_OTHER): Payer: Medicare Other | Admitting: Physician Assistant

## 2011-08-21 ENCOUNTER — Ambulatory Visit (HOSPITAL_BASED_OUTPATIENT_CLINIC_OR_DEPARTMENT_OTHER): Payer: Medicare Other

## 2011-08-21 ENCOUNTER — Other Ambulatory Visit: Payer: Medicare Other

## 2011-08-21 VITALS — BP 136/72 | HR 91 | Temp 97.3°F | Ht 59.0 in | Wt 185.2 lb

## 2011-08-21 DIAGNOSIS — C50919 Malignant neoplasm of unspecified site of unspecified female breast: Secondary | ICD-10-CM

## 2011-08-21 DIAGNOSIS — Z5111 Encounter for antineoplastic chemotherapy: Secondary | ICD-10-CM

## 2011-08-21 DIAGNOSIS — C50419 Malignant neoplasm of upper-outer quadrant of unspecified female breast: Secondary | ICD-10-CM

## 2011-08-21 DIAGNOSIS — C50912 Malignant neoplasm of unspecified site of left female breast: Secondary | ICD-10-CM

## 2011-08-21 LAB — CBC WITH DIFFERENTIAL/PLATELET
Basophils Absolute: 0 10*3/uL (ref 0.0–0.1)
EOS%: 0 % (ref 0.0–7.0)
HCT: 34.3 % — ABNORMAL LOW (ref 34.8–46.6)
HGB: 11.2 g/dL — ABNORMAL LOW (ref 11.6–15.9)
LYMPH%: 11 % — ABNORMAL LOW (ref 14.0–49.7)
MCH: 30.8 pg (ref 25.1–34.0)
MCV: 94.2 fL (ref 79.5–101.0)
NEUT%: 83.5 % — ABNORMAL HIGH (ref 38.4–76.8)
Platelets: 378 10*3/uL (ref 145–400)
lymph#: 1.6 10*3/uL (ref 0.9–3.3)

## 2011-08-21 LAB — COMPREHENSIVE METABOLIC PANEL
Alkaline Phosphatase: 76 U/L (ref 39–117)
BUN: 16 mg/dL (ref 6–23)
Creatinine, Ser: 0.6 mg/dL (ref 0.50–1.10)
Glucose, Bld: 137 mg/dL — ABNORMAL HIGH (ref 70–99)
Total Bilirubin: 0.3 mg/dL (ref 0.3–1.2)

## 2011-08-21 MED ORDER — DOCETAXEL CHEMO INJECTION 160 MG/16ML
75.0000 mg/m2 | Freq: Once | INTRAVENOUS | Status: AC
  Administered 2011-08-21: 140 mg via INTRAVENOUS
  Filled 2011-08-21: qty 14

## 2011-08-21 MED ORDER — DEXAMETHASONE SODIUM PHOSPHATE 4 MG/ML IJ SOLN
20.0000 mg | Freq: Once | INTRAMUSCULAR | Status: AC
  Administered 2011-08-21: 20 mg via INTRAVENOUS

## 2011-08-21 MED ORDER — ONDANSETRON 16 MG/50ML IVPB (CHCC)
16.0000 mg | Freq: Once | INTRAVENOUS | Status: AC
  Administered 2011-08-21: 16 mg via INTRAVENOUS

## 2011-08-21 MED ORDER — SODIUM CHLORIDE 0.9 % IV SOLN
Freq: Once | INTRAVENOUS | Status: AC
  Administered 2011-08-21: 11:00:00 via INTRAVENOUS

## 2011-08-21 MED ORDER — SODIUM CHLORIDE 0.9 % IV SOLN
600.0000 mg/m2 | Freq: Once | INTRAVENOUS | Status: AC
  Administered 2011-08-21: 1120 mg via INTRAVENOUS
  Filled 2011-08-21: qty 56

## 2011-08-21 NOTE — Patient Instructions (Signed)
Pt discharged ambulatory with next appointment confirmed.  Pt aware to call with any questions or concerns.  

## 2011-08-21 NOTE — Progress Notes (Deleted)
DIAGNOSIS:  A 65-year-old Christina Horn, Christina Horn, woman with a history of a T3 N1, estrogen receptor/progesterone receptor positive, HER-2 negative left breast carcinoma with an Oncotype DX score of 28, status post left modified radical mastectomy with axillary node dissection.  CURRENT THERAPY:  For day 1, cycle 3 of 4 planned q.3-week doses of Taxotere/Cytoxan with Neulasta support on day 2.  SUBJECTIVE:  Christina Horn is seen today with her sister-in-law in accompaniment for followup prior to her third cycle of q.3-week Taxotere and Cytoxan.  She is actually doing quite well.  She has premedicated with dexamethasone.  She denies any fevers, chills, night sweats, shortness of breath, chest pain.  No nausea, emesis, diarrhea, or constipation issues.  No heartburn symptoms.  She denies any excessive eye tearing, headaches, or vision changes.  She denies any mouth sores. No evidence of TPE changes or neuropathy.  Review of systems is otherwise negative.  ALLERGIES:  No known drug allergies.  CURRENT MEDICATIONS:  Current medications reviewed with patient and reviewed as per EMR.  ECOG status of 0.  OBJECTIVE PHYSICAL EXAMINATION:  Vital Signs:  Blood pressure is 136/72, pulse 91, respirations 20, temp 97.3, weight 185 pounds.  HEENT: Conjunctivae pink.  Sclerae anicteric.  Oropharynx is benign without mucositis or candidiasis.  Lymph:  No cervical or supraclavicular lymphadenopathy is present on exam.  Lungs:  Clear to auscultation without wheezing or rhonchi.  Heart:  Regular rate and rhythm without murmurs, rubs, gallops, or clicks.  Abdomen:  Soft, normal bowel sounds. Extremities:  Benign.  Neurologic Exam:  Nonfocal.  LABORATORY DATA:  Hemoglobin 11.2 g, platelet count 378,000, WBC 14600 with an ANC of 12,200.  CMET pending.  IMPRESSION:  A 65-year-old Christina Horn, [place], woman with a history of a T3 N1, estrogen receptor/progesterone receptor positive, HER-2 negative  left breast carcinoma, for which she underwent a left modified radical mastectomy with axillary node dissection, for day 1, cycle 3 of 4 planned q.3-week doses of Taxotere/Cytoxan with Neulasta support on day 2.  The case has been reviewed with Dr. Rubin.  PLAN:  Proceed with treatment as scheduled.  The patient will return on day 2 for Neulasta providing granulocyte support.  I will see her in 1 week's time for nadir assessment.  She knows to contact us sooner if the need should arise.   ______________________________ Christina Scherer, PA CS/MEDQ  D:  08/21/2011  T:  08/21/2011  Job:  [id number] 

## 2011-08-21 NOTE — Progress Notes (Signed)
Progress note dictated-CTS 

## 2011-08-22 ENCOUNTER — Ambulatory Visit (HOSPITAL_BASED_OUTPATIENT_CLINIC_OR_DEPARTMENT_OTHER): Payer: Medicare Other

## 2011-08-22 VITALS — BP 140/67 | HR 81 | Temp 99.1°F

## 2011-08-22 DIAGNOSIS — C50912 Malignant neoplasm of unspecified site of left female breast: Secondary | ICD-10-CM

## 2011-08-22 DIAGNOSIS — C50919 Malignant neoplasm of unspecified site of unspecified female breast: Secondary | ICD-10-CM

## 2011-08-22 MED ORDER — PEGFILGRASTIM INJECTION 6 MG/0.6ML
6.0000 mg | Freq: Once | SUBCUTANEOUS | Status: AC
  Administered 2011-08-22: 6 mg via SUBCUTANEOUS
  Filled 2011-08-22: qty 0.6

## 2011-08-24 NOTE — Progress Notes (Deleted)
DIAGNOSIS:  A 65 year old Christina Horn, [place], woman with a history of a T3 N1, estrogen receptor/progesterone receptor positive, HER-2 negative left breast carcinoma with an Oncotype DX score of 28, status post left modified radical mastectomy with axillary node dissection.  CURRENT THERAPY:  For day 1, cycle 3 of 4 planned q.3-week doses of Taxotere/Cytoxan with Neulasta support on day 2.  SUBJECTIVE:  Christina Horn is seen today with her sister-in-law in accompaniment for followup prior to her third cycle of q.3-week Taxotere and Cytoxan.  She is actually doing quite well.  She has premedicated with dexamethasone.  She denies any fevers, chills, night sweats, shortness of breath, chest pain.  No nausea, emesis, diarrhea, or constipation issues.  No heartburn symptoms.  She denies any excessive eye tearing, headaches, or vision changes.  She denies any mouth sores. No evidence of TPE changes or neuropathy.  Review of systems is otherwise negative.  ALLERGIES:  No known drug allergies.  CURRENT MEDICATIONS:  Current medications reviewed with patient and reviewed as per EMR.  ECOG status of 0.  OBJECTIVE PHYSICAL EXAMINATION:  Vital Signs:  Blood pressure is 136/72, pulse 91, respirations 20, temp 97.3, weight 185 pounds.  HEENT: Conjunctivae pink.  Sclerae anicteric.  Oropharynx is benign without mucositis or candidiasis.  Lymph:  No cervical or supraclavicular lymphadenopathy is present on exam.  Lungs:  Clear to auscultation without wheezing or rhonchi.  Heart:  Regular rate and rhythm without murmurs, rubs, gallops, or clicks.  Abdomen:  Soft, normal bowel sounds. Extremities:  Benign.  Neurologic Exam:  Nonfocal.  LABORATORY DATA:  Hemoglobin 11.2 g, platelet count 378,000, WBC 14600 with an ANC of 12,200.  CMET pending.  IMPRESSION:  A 65 year old [place], [place], woman with a history of a T3 N1, estrogen receptor/progesterone receptor positive, HER-2 negative  left breast carcinoma, for which she underwent a left modified radical mastectomy with axillary node dissection, for day 1, cycle 3 of 4 planned q.3-week doses of Taxotere/Cytoxan with Neulasta support on day 2.  The case has been reviewed with Dr. Donnie Coffin.  PLAN:  Proceed with treatment as scheduled.  The patient will return on day 2 for Neulasta providing granulocyte support.  I will see her in 1 week's time for nadir assessment.  She knows to contact us sooner if the need should arise.   ______________________________ Sharyl Nimrod, PA CS/MEDQ  D:  08/21/2011  T:  08/21/2011  Job:  [id number]

## 2011-08-27 NOTE — Progress Notes (Signed)
DIAGNOSIS:  A 65-year-old Christina Horn, [place], woman with a history of a T3 N1, estrogen receptor/progesterone receptor positive, HER-2 negative left breast carcinoma with an Oncotype DX score of 28, status post left modified radical mastectomy with axillary node dissection.  CURRENT THERAPY:  For day 1, cycle 3 of 4 planned q.3-week doses of Taxotere/Cytoxan with Neulasta support on day 2.  SUBJECTIVE:  Christina Horn is seen today with her sister-in-law in accompaniment for followup prior to her third cycle of q.3-week Taxotere and Cytoxan.  She is actually doing quite well.  She has premedicated with dexamethasone.  She denies any fevers, chills, night sweats, shortness of breath, chest pain.  No nausea, emesis, diarrhea, or constipation issues.  No heartburn symptoms.  She denies any excessive eye tearing, headaches, or vision changes.  She denies any mouth sores. No evidence of TPE changes or neuropathy.  Review of systems is otherwise negative.  ALLERGIES:  No known drug allergies.  CURRENT MEDICATIONS:  Current medications reviewed with patient and reviewed as per EMR.  ECOG status of 0.  OBJECTIVE PHYSICAL EXAMINATION:  Vital Signs:  Blood pressure is 136/72, pulse 91, respirations 20, temp 97.3, weight 185 pounds.  HEENT: Conjunctivae pink.  Sclerae anicteric.  Oropharynx is benign without mucositis or candidiasis.  Lymph:  No cervical or supraclavicular lymphadenopathy is present on exam.  Lungs:  Clear to auscultation without wheezing or rhonchi.  Heart:  Regular rate and rhythm without murmurs, rubs, gallops, or clicks.  Abdomen:  Soft, normal bowel sounds. Extremities:  Benign.  Neurologic Exam:  Nonfocal.  LABORATORY DATA:  Hemoglobin 11.2 g, platelet count 378,000, WBC 14600 with an ANC of 12,200.  CMET pending.  IMPRESSION:  A 65-year-old Christina Horn, [place], woman with a history of a T3 N1, estrogen receptor/progesterone receptor positive, HER-2 negative  left breast carcinoma, for which she underwent a left modified radical mastectomy with axillary node dissection, for day 1, cycle 3 of 4 planned q.3-week doses of Taxotere/Cytoxan with Neulasta support on day 2.  The case has been reviewed with Dr. Rubin.  PLAN:  Proceed with treatment as scheduled.  The patient will return on day 2 for Neulasta providing granulocyte support.  I will see her in 1 week's time for nadir assessment.  She knows to contact us sooner if the need should arise.   ______________________________ Jeaninne Lodico, PA CS/MEDQ  D:  08/21/2011  T:  08/21/2011  Job:  [id number] 

## 2011-08-28 ENCOUNTER — Other Ambulatory Visit: Payer: Self-pay

## 2011-08-28 ENCOUNTER — Ambulatory Visit (HOSPITAL_BASED_OUTPATIENT_CLINIC_OR_DEPARTMENT_OTHER): Payer: Medicare Other | Admitting: Physician Assistant

## 2011-08-28 ENCOUNTER — Telehealth: Payer: Self-pay | Admitting: *Deleted

## 2011-08-28 ENCOUNTER — Other Ambulatory Visit: Payer: Medicare Other | Admitting: Lab

## 2011-08-28 VITALS — BP 119/69 | HR 91 | Temp 98.5°F | Ht 59.0 in | Wt 179.4 lb

## 2011-08-28 DIAGNOSIS — C50419 Malignant neoplasm of upper-outer quadrant of unspecified female breast: Secondary | ICD-10-CM

## 2011-08-28 DIAGNOSIS — Z170 Estrogen receptor positive status [ER+]: Secondary | ICD-10-CM

## 2011-08-28 DIAGNOSIS — D709 Neutropenia, unspecified: Secondary | ICD-10-CM

## 2011-08-28 DIAGNOSIS — C50119 Malignant neoplasm of central portion of unspecified female breast: Secondary | ICD-10-CM

## 2011-08-28 DIAGNOSIS — C50912 Malignant neoplasm of unspecified site of left female breast: Secondary | ICD-10-CM

## 2011-08-28 LAB — CBC WITH DIFFERENTIAL/PLATELET
Basophils Absolute: 0.1 10*3/uL (ref 0.0–0.1)
EOS%: 2.9 % (ref 0.0–7.0)
HGB: 9.7 g/dL — ABNORMAL LOW (ref 11.6–15.9)
LYMPH%: 43.1 % (ref 14.0–49.7)
MCH: 30.5 pg (ref 25.1–34.0)
MCV: 94 fL (ref 79.5–101.0)
MONO%: 29.3 % — ABNORMAL HIGH (ref 0.0–14.0)
NEUT%: 22.5 % — ABNORMAL LOW (ref 38.4–76.8)
Platelets: 238 10*3/uL (ref 145–400)
RDW: 15 % — ABNORMAL HIGH (ref 11.2–14.5)

## 2011-08-28 MED ORDER — POTASSIUM CHLORIDE CRYS ER 20 MEQ PO TBCR: 20.0000 meq | EXTENDED_RELEASE_TABLET | Freq: Every day | ORAL | Status: DC

## 2011-08-28 NOTE — Progress Notes (Signed)
DIAGNOSIS:  A 65 year old [place], [place], woman with a history of a T3 N1, ER/PR-positive, HER-2-negative left breast carcinoma with an Oncotype DX score of 28.  Status post left modified radical mastectomy with axillary node dissection.  CURRENT THERAPY:  For day 7, cycle 3 of 4 planned q.3 week doses of Taxotere/Cytoxan with Neulasta support on day 2.  SUBJECTIVE:  Christina Horn is seen today unaccompanied for followup after her 3rd of 4 planned q.3 week doses of Taxotere and Cytoxan given in the adjuvant setting.  She really voices no specific complaints.  She does receive Neulasta for granulocyte support on day 2, though she denies any diffuse bony pains, no fevers chills or night sweats.  No shortness of breath or chest pain.  She denies any persistent nausea, emesis, no heartburn symptoms.  No diarrhea or constipation issues.  No neuropathy symptoms whatsoever.  Review of systems is otherwise negative.  ALLERGIES:  Reviewed with the patient and updated.  No known drug allergies.  CURRENT MEDICATIONS:  As per EMR.  ECOG STATUS:  1.  OBJECTIVE:  Physical exam:  Vital signs:  Blood pressure is 119/69, pulse 91, respirations 20, temperature 98.5, weight 179 pounds.  HEENT: Conjunctivae pink.  Sclerae anicteric.  Oropharynx is benign without mucositis or candidosis.  Lungs:  Clear to auscultation.  No evidence of wheezing or rhonchi.  Heart:  Regular rate and rhythm without murmurs, rubs, gallops, or clicks.  Abdomen:  Soft, nontender, with normal bowel sounds.  Extremities:  Benign, free of pedal edema.  Neurologic: Nonfocal.  LABORATORY DATA:  Hemoglobin 9.7 g, platelet count 238,000, WBC 2800 with an ANC of 600.  IMPRESSION: 41. A 64 year old [place], [place], woman with a history of a T3     N1, estrogen receptor/progesterone receptor-positive, HER-2-     negative left breast carcinoma,, status post left modified radical     mastectomy with axillary node  dissection, Oncotype DX score of 28.     She is currently day 7, cycle 3 of 4 planned q.3 week doses of     Taxotere/Cytoxan. 2. Mild neutropenia, day 6 Neulasta. Case reviewed with Dr. Donnie Coffin.  PLAN:  Christina Horn will not be placed on an antibiotic at this time, but she will follow for any neutropenic fevers and contact our office. Otherwise, she will also hold her potassium.  Her potassium was noted to be completely well within the normal limits last week, and she admits she is probably not staying quite as  hydrated as she should. Therefore, I will have her hold potassium between now and her followup as scheduled on 09/11/2011.  She understands and agrees with this plan.    ______________________________ Sharyl Nimrod, PA CS/MEDQ  D:  08/28/2011  T:  08/28/2011  Job:  [id number]

## 2011-08-28 NOTE — Progress Notes (Signed)
Progress note dictated-CTS 

## 2011-08-28 NOTE — Telephone Encounter (Signed)
No note

## 2011-09-11 ENCOUNTER — Other Ambulatory Visit: Payer: Self-pay | Admitting: Oncology

## 2011-09-11 ENCOUNTER — Other Ambulatory Visit: Payer: Self-pay

## 2011-09-11 ENCOUNTER — Other Ambulatory Visit: Payer: Medicare Other

## 2011-09-11 ENCOUNTER — Ambulatory Visit: Payer: Medicare Other | Admitting: Physician Assistant

## 2011-09-11 ENCOUNTER — Ambulatory Visit (HOSPITAL_BASED_OUTPATIENT_CLINIC_OR_DEPARTMENT_OTHER): Payer: Medicare Other

## 2011-09-11 VITALS — BP 133/65 | HR 91 | Temp 98.5°F | Ht 59.0 in | Wt 183.5 lb

## 2011-09-11 DIAGNOSIS — C50419 Malignant neoplasm of upper-outer quadrant of unspecified female breast: Secondary | ICD-10-CM

## 2011-09-11 DIAGNOSIS — Z5111 Encounter for antineoplastic chemotherapy: Secondary | ICD-10-CM

## 2011-09-11 DIAGNOSIS — C50919 Malignant neoplasm of unspecified site of unspecified female breast: Secondary | ICD-10-CM

## 2011-09-11 DIAGNOSIS — C50912 Malignant neoplasm of unspecified site of left female breast: Secondary | ICD-10-CM

## 2011-09-11 DIAGNOSIS — Z170 Estrogen receptor positive status [ER+]: Secondary | ICD-10-CM

## 2011-09-11 LAB — CBC WITH DIFFERENTIAL/PLATELET
BASO%: 0.2 % (ref 0.0–2.0)
EOS%: 0 % (ref 0.0–7.0)
HCT: 30.6 % — ABNORMAL LOW (ref 34.8–46.6)
LYMPH%: 10.1 % — ABNORMAL LOW (ref 14.0–49.7)
MCH: 31.1 pg (ref 25.1–34.0)
MCHC: 32.4 g/dL (ref 31.5–36.0)
MCV: 96.2 fL (ref 79.5–101.0)
MONO%: 4.3 % (ref 0.0–14.0)
NEUT%: 85.4 % — ABNORMAL HIGH (ref 38.4–76.8)
Platelets: 414 10*3/uL — ABNORMAL HIGH (ref 145–400)
RBC: 3.18 10*6/uL — ABNORMAL LOW (ref 3.70–5.45)
WBC: 19.3 10*3/uL — ABNORMAL HIGH (ref 3.9–10.3)
nRBC: 0 % (ref 0–0)

## 2011-09-11 LAB — COMPREHENSIVE METABOLIC PANEL
AST: 13 U/L (ref 0–37)
Albumin: 3.6 g/dL (ref 3.5–5.2)
Alkaline Phosphatase: 73 U/L (ref 39–117)
Potassium: 4.5 mEq/L (ref 3.5–5.3)
Sodium: 139 mEq/L (ref 135–145)
Total Bilirubin: 0.3 mg/dL (ref 0.3–1.2)
Total Protein: 5.9 g/dL — ABNORMAL LOW (ref 6.0–8.3)

## 2011-09-11 MED ORDER — ONDANSETRON 16 MG/50ML IVPB (CHCC)
16.0000 mg | Freq: Once | INTRAVENOUS | Status: AC
  Administered 2011-09-11: 16 mg via INTRAVENOUS

## 2011-09-11 MED ORDER — DEXAMETHASONE SODIUM PHOSPHATE 4 MG/ML IJ SOLN
20.0000 mg | Freq: Once | INTRAMUSCULAR | Status: AC
  Administered 2011-09-11: 20 mg via INTRAVENOUS

## 2011-09-11 MED ORDER — SODIUM CHLORIDE 0.9 % IV SOLN
Freq: Once | INTRAVENOUS | Status: AC
  Administered 2011-09-11: 12:00:00 via INTRAVENOUS

## 2011-09-11 MED ORDER — DOCETAXEL CHEMO INJECTION 160 MG/16ML
75.0000 mg/m2 | Freq: Once | INTRAVENOUS | Status: AC
  Administered 2011-09-11: 140 mg via INTRAVENOUS
  Filled 2011-09-11: qty 14

## 2011-09-11 MED ORDER — SODIUM CHLORIDE 0.9 % IV SOLN
600.0000 mg/m2 | Freq: Once | INTRAVENOUS | Status: AC
  Administered 2011-09-11: 1120 mg via INTRAVENOUS
  Filled 2011-09-11: qty 56

## 2011-09-11 NOTE — Progress Notes (Signed)
Progress note dictated-CTS 

## 2011-09-11 NOTE — Progress Notes (Signed)
DIAGNOSIS:  A 65 year old [place], [place], woman with a history of a T3 N1, ER/PR positive, HER2 negative left breast carcinoma with Oncotype DX score of 28.  She underwent a left modified radical mastectomy with axillary node dissection.  CURRENT THERAPY:  For day 1 cycle 4 of 4 planned q.3 week doses of adjuvant Taxotere/Cytoxan with Neulasta support on day 2.  SUBJECTIVE:  [name] is seen today with her niece in accompaniment for followup prior to her fourth and final cycle of q.3 week Taxotere and Cytoxan given in the adjuvant setting.  She feels well, denying any unexplained fevers, chills, night sweats, shortness of breath, chest pain.  No nausea, emesis, diarrhea or constipation issues.  No hand-foot changes.  No excessive eye tearing.  Energy level is pretty good.  REVIEW OF SYSTEMS:  Otherwise negative.  ALLERGIES:  No known drug allergies.  CURRENT MEDICATIONS:  As per EMR.  ECOG STATUS:  One.  PHYSICAL EXAMINATION:  Vital signs:  Blood pressure 133/65, pulse 91, respirations 20, temp 98.5, weight 183 pounds.  HEENT:  Conjunctivae pink.  Sclerae anicteric.  Oropharynx is benign without mucositis or candidiasis.  Lungs:  Are clear to auscultation without wheezing or rhonchi.  Heart:  Regular rate and rhythm without murmurs, rubs, gallops or clicks.  Abdomen:  Soft, nontender without organomegaly.  Normal bowel sounds.  Extremities:  Free of pedal edema.  Neurologic: Nonfocal.  The patient is alert and oriented times 3.  LABORATORY DATA:  Hemoglobin 9.9 g, platelet count 414,000, WBC 19,300, with an ANC of 16,500.  CMET pending.  IMPRESSION:  A 65 year old [name], [place], woman a history of a T3 N1, ER/PR positive, HER2 negative left breast carcinoma status post left modified radical mastectomy with axillary node dissection, Oncotype DX score of 28.  She is due for day 1 cycle 4 of 4 planned q.3 week doses of adjuvant Taxotere/Cytoxan with Neulasta  support on day 2.  Case reviewed with Dr. Donnie Coffin.  PLAN:  Christina Horn will receive treatment today as scheduled.  She will return for Neulasta on day 2.  We will refer her back to Dr. Michell Heinrich for definitive radiation planning.  I will see her in 1 week's time for nadir assessment.  She understands and agrees with this plan.    ______________________________ Sharyl Nimrod, PA CS/MEDQ  D:  09/11/2011  T:  09/11/2011  Job:  [id number]

## 2011-09-12 ENCOUNTER — Ambulatory Visit (HOSPITAL_BASED_OUTPATIENT_CLINIC_OR_DEPARTMENT_OTHER): Payer: Medicare Other

## 2011-09-12 VITALS — BP 126/63 | HR 83 | Temp 98.2°F

## 2011-09-12 DIAGNOSIS — C50919 Malignant neoplasm of unspecified site of unspecified female breast: Secondary | ICD-10-CM

## 2011-09-12 DIAGNOSIS — C50419 Malignant neoplasm of upper-outer quadrant of unspecified female breast: Secondary | ICD-10-CM

## 2011-09-12 DIAGNOSIS — C50912 Malignant neoplasm of unspecified site of left female breast: Secondary | ICD-10-CM

## 2011-09-12 MED ORDER — PEGFILGRASTIM INJECTION 6 MG/0.6ML
6.0000 mg | Freq: Once | SUBCUTANEOUS | Status: AC
  Administered 2011-09-12: 6 mg via SUBCUTANEOUS
  Filled 2011-09-12: qty 0.6

## 2011-09-18 ENCOUNTER — Other Ambulatory Visit: Payer: Medicare Other | Admitting: Lab

## 2011-09-18 ENCOUNTER — Ambulatory Visit (HOSPITAL_BASED_OUTPATIENT_CLINIC_OR_DEPARTMENT_OTHER): Payer: Medicare Other | Admitting: Physician Assistant

## 2011-09-18 ENCOUNTER — Other Ambulatory Visit: Payer: Self-pay

## 2011-09-18 VITALS — BP 110/61 | HR 96 | Temp 98.5°F | Ht 59.0 in | Wt 180.1 lb

## 2011-09-18 DIAGNOSIS — Z23 Encounter for immunization: Secondary | ICD-10-CM

## 2011-09-18 DIAGNOSIS — C50919 Malignant neoplasm of unspecified site of unspecified female breast: Secondary | ICD-10-CM

## 2011-09-18 DIAGNOSIS — D709 Neutropenia, unspecified: Secondary | ICD-10-CM

## 2011-09-18 DIAGNOSIS — Z170 Estrogen receptor positive status [ER+]: Secondary | ICD-10-CM

## 2011-09-18 DIAGNOSIS — C50912 Malignant neoplasm of unspecified site of left female breast: Secondary | ICD-10-CM

## 2011-09-18 LAB — CBC WITH DIFFERENTIAL/PLATELET
Basophils Absolute: 0.1 10*3/uL (ref 0.0–0.1)
EOS%: 3.2 % (ref 0.0–7.0)
Eosinophils Absolute: 0.1 10*3/uL (ref 0.0–0.5)
LYMPH%: 49 % (ref 14.0–49.7)
MCH: 31 pg (ref 25.1–34.0)
MCV: 95.8 fL (ref 79.5–101.0)
MONO%: 24.1 % — ABNORMAL HIGH (ref 0.0–14.0)
NEUT#: 0.5 10*3/uL — ABNORMAL LOW (ref 1.5–6.5)
Platelets: 256 10*3/uL (ref 145–400)
RBC: 3.1 10*6/uL — ABNORMAL LOW (ref 3.70–5.45)
RDW: 16.6 % — ABNORMAL HIGH (ref 11.2–14.5)
nRBC: 2 % — ABNORMAL HIGH (ref 0–0)

## 2011-09-18 MED ORDER — INFLUENZA VIRUS VACC SPLIT PF IM SUSP
0.5000 mL | INTRAMUSCULAR | Status: AC
  Administered 2011-09-18: 0.5 mL via INTRAMUSCULAR
  Filled 2011-09-18: qty 0.5

## 2011-09-18 NOTE — Progress Notes (Signed)
Progress note dictated-CTS 

## 2011-09-18 NOTE — Progress Notes (Signed)
DIAGNOSIS:  A 65 year old [place], [place], woman with a history of a T3 N1, estrogen receptor/progesterone receptor positive, HER-2 negative left breast carcinoma with Oncotype DX score of 28, status post left modified radical mastectomy with axillary node dissection.  CURRENT THERAPY:  Day 6, cycle 4 of 4 planned q.3-week doses of adjuvant Taxotere/Cytoxan with Neulasta support on day 2.  SUBJECTIVE:  Ms. Robleto is seen today unaccompanied for followup after her 4th and final cycle of q.3-week TC with Neulasta support on day 2. She actually tolerated her treatment quite well.  Overall she feels pretty well.  She does request a flu vaccine today, which has been approved.  Of note, she will be seen by Dr. Michell Heinrich for Rad-Onc consultation on 09/19/2011.  She denies any fevers, chills, night sweats, shortness of breath, or chest pain.  No nausea, emesis, diarrhea, or constipation issues.  Energy level is actually recovering nicely.  She denies any bleeding or bruising symptoms.  REVIEW OF SYSTEMS:  Review of systems is negative.  ALLERGIES:  No known drug allergies.  CURRENT MEDICATIONS:  Current medications as per EMR.  ECOG status of 1.  OBJECTIVE PHYSICAL EXAMINATION:  Vital Signs:  Blood pressure is 110/61, pulse 96, respirations 20, temp 98.5, weight 180 pounds.  HEENT: Conjunctivae pink.  Sclerae anicteric.  Oropharynx is benign without oral mucositis or candidiasis.  Lungs:  Clear to auscultation without wheezing or rhonchi.  Heart:  Regular rate and rhythm without murmurs, rubs, gallops, or clicks.  Abdomen:  Soft, nontender without organomegaly.  Normal bowel sounds.  Extremities:  Free of pedal edema. Neurologic Exam:  Nonfocal and the patient is alert and oriented x3.  LABORATORY DATA:  Hemoglobin 9.6 g, platelet count 256,000, WBC 2500 with an ANC of 5000.  Chemistry panel from 1 week ago is essentially within normal limits.  IMPRESSIONS: 94. A 65 year old  [place], [place], woman with a history of a T3     N1, estrogen receptor/progesterone receptor positive, HER-2     negative left breast carcinoma, status post left modified radical     mastectomy with axillary node dissection, Oncotype DX score of 28,     currently day 6, cycle 4 of 4 planned q.3-week doses of adjuvant     Taxotere/Cytoxan with Neulasta support on day 2. 2. Afebrile neutropenia. Case has been reviewed.  PLAN:  Gaytha will receive a flu vaccine today as scheduled, consent has been obtained.  She will keep her appointment as scheduled tomorrow for Rad-Onc assessment.  We will plan rechecking a CBC in a week's time, but plan formal followup with Dr. Donnie Coffin in approximately 2 months to discuss adjuvant hormonal therapy.  Camilia knows though we would be happy to see her in the interim if the need should arise.    ______________________________ Sharyl Nimrod, PA CS/MEDQ  D:  09/18/2011  T:  09/18/2011  Job:  [id number]

## 2011-09-19 ENCOUNTER — Ambulatory Visit: Payer: Medicare Other | Admitting: Radiation Oncology

## 2011-09-19 ENCOUNTER — Ambulatory Visit
Admission: RE | Admit: 2011-09-19 | Discharge: 2011-09-19 | Disposition: A | Discharge status: Home / Self Care | Payer: Medicare Other | Source: Ambulatory Visit | Attending: Radiation Oncology | Admitting: Radiation Oncology

## 2011-09-19 ENCOUNTER — Encounter: Payer: Self-pay | Admitting: Radiation Oncology

## 2011-09-19 ENCOUNTER — Ambulatory Visit: Payer: Medicare Other

## 2011-09-19 VITALS — BP 130/69 | HR 93 | Temp 98.3°F | Ht 59.0 in | Wt 182.5 lb

## 2011-09-19 DIAGNOSIS — C50919 Malignant neoplasm of unspecified site of unspecified female breast: Secondary | ICD-10-CM | POA: Insufficient documentation

## 2011-09-19 DIAGNOSIS — C50912 Malignant neoplasm of unspecified site of left female breast: Secondary | ICD-10-CM

## 2011-09-19 DIAGNOSIS — Z9010 Acquired absence of unspecified breast and nipple: Secondary | ICD-10-CM | POA: Insufficient documentation

## 2011-09-19 HISTORY — DX: Unspecified osteoarthritis, unspecified site: M19.90

## 2011-09-19 NOTE — Progress Notes (Signed)
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

## 2011-09-19 NOTE — Progress Notes (Signed)
CC:   Adolph Pollack, M.D. Massie Maroon, MD Pierce Crane, M.D., F.R.C.P.C.  DIAGNOSIS:  T3 N1 multifocal invasive ductal carcinoma of the left breast.  PREVIOUS INTERVENTIONS:  Left mastectomy and sentinel lymph node biopsy on 06/07/2011 revealing multifocal grade 2 invasive ductal carcinoma with diffuse lymphovascular invasion, negative margins and largest tumor measuring 5.1 cm.  One out of 7 lymph nodes positive.  INTERVAL HISTORY:  Ms. Weare reports for followup today.  She has completed her TC.  She tolerated it really very well.  She really had no problems with nausea.  She had some fatigue, but that is about it.  She presents today for discussion of radiation.  PHYSICAL EXAMINATION:  General:  She is a pleasant female in no distress sitting comfortably on the exam room table.  Vital Signs:  Weight of 182 pounds, height 4 feet, 11 inches, blood pressure 130/69, pulse 93, temperature 98.3.  Chest wall:  Her left chest wall is well healed. HEENT:  She has alopecia.  IMPRESSION:  T3 N1 invasive ductal carcinoma of the left breast status post mastectomy.  RECOMMENDATIONS:  I talked to [facility] at length today regarding the role of radiation in decreasing chest wall recurrence.  We discussed the role of simulation and the placement of tattoos.  We discussed treatment of her left chest wall as well as the supraclavicular fossa.  We discussed the possible side effects of treatment, including but not limited to skin redness and fatigue.  We discussed treatment in [place], which is where she prefers to be treated.  We discussed the use of CAT scans in treatment planning to decrease dose to the heart and lungs.  She signed informed consent and agreed to proceed forward.  She is given a copy of her informed consent as well as information on simulation.  She will be scheduled in January for simulation at [facility].    ______________________________ Lurline Hare, M.D. SW/MEDQ  D:   09/20/2011  T:  09/19/2011  Job:  [id number]

## 2011-09-19 NOTE — Progress Notes (Signed)
Please see the Nurse Progress Note in the MD Initial Consult Encounter for this patient. 

## 2011-09-24 ENCOUNTER — Other Ambulatory Visit (HOSPITAL_BASED_OUTPATIENT_CLINIC_OR_DEPARTMENT_OTHER): Payer: Medicare Other | Admitting: Lab

## 2011-09-24 DIAGNOSIS — C50912 Malignant neoplasm of unspecified site of left female breast: Secondary | ICD-10-CM

## 2011-09-24 DIAGNOSIS — C50919 Malignant neoplasm of unspecified site of unspecified female breast: Secondary | ICD-10-CM

## 2011-09-24 LAB — CBC WITH DIFFERENTIAL/PLATELET
BASO%: 0 % (ref 0.0–2.0)
EOS%: 0.2 % (ref 0.0–7.0)
HCT: 29 % — ABNORMAL LOW (ref 34.8–46.6)
LYMPH%: 10.7 % — ABNORMAL LOW (ref 14.0–49.7)
MCH: 32.8 pg (ref 25.1–34.0)
MCHC: 33.7 g/dL (ref 31.5–36.0)
MONO%: 2.4 % (ref 0.0–14.0)
NEUT%: 86.7 % — ABNORMAL HIGH (ref 38.4–76.8)
Platelets: 176 10*3/uL (ref 145–400)
RBC: 2.98 10*6/uL — ABNORMAL LOW (ref 3.70–5.45)
WBC: 25.5 10*3/uL — ABNORMAL HIGH (ref 3.9–10.3)

## 2011-10-01 ENCOUNTER — Other Ambulatory Visit: Payer: Self-pay | Admitting: *Deleted

## 2011-10-01 DIAGNOSIS — C50119 Malignant neoplasm of central portion of unspecified female breast: Secondary | ICD-10-CM

## 2011-10-01 MED ORDER — POTASSIUM CHLORIDE CRYS ER 20 MEQ PO TBCR: 20.0000 meq | EXTENDED_RELEASE_TABLET | Freq: Every day | ORAL | Status: DC

## 2011-10-19 DIAGNOSIS — Z9010 Acquired absence of unspecified breast and nipple: Secondary | ICD-10-CM | POA: Diagnosis not present

## 2011-10-19 DIAGNOSIS — Z510 Encounter for antineoplastic radiation therapy: Secondary | ICD-10-CM | POA: Diagnosis not present

## 2011-10-19 DIAGNOSIS — C50419 Malignant neoplasm of upper-outer quadrant of unspecified female breast: Secondary | ICD-10-CM | POA: Diagnosis not present

## 2011-10-26 DIAGNOSIS — C50419 Malignant neoplasm of upper-outer quadrant of unspecified female breast: Secondary | ICD-10-CM | POA: Diagnosis not present

## 2011-10-29 DIAGNOSIS — C50419 Malignant neoplasm of upper-outer quadrant of unspecified female breast: Secondary | ICD-10-CM | POA: Diagnosis not present

## 2011-10-30 DIAGNOSIS — C50419 Malignant neoplasm of upper-outer quadrant of unspecified female breast: Secondary | ICD-10-CM | POA: Diagnosis not present

## 2011-11-06 DIAGNOSIS — C50419 Malignant neoplasm of upper-outer quadrant of unspecified female breast: Secondary | ICD-10-CM | POA: Diagnosis not present

## 2011-11-13 DIAGNOSIS — C50419 Malignant neoplasm of upper-outer quadrant of unspecified female breast: Secondary | ICD-10-CM | POA: Diagnosis not present

## 2011-11-16 DIAGNOSIS — Z510 Encounter for antineoplastic radiation therapy: Secondary | ICD-10-CM | POA: Diagnosis not present

## 2011-11-16 DIAGNOSIS — C50419 Malignant neoplasm of upper-outer quadrant of unspecified female breast: Secondary | ICD-10-CM | POA: Diagnosis not present

## 2011-11-16 DIAGNOSIS — Z9010 Acquired absence of unspecified breast and nipple: Secondary | ICD-10-CM | POA: Diagnosis not present

## 2011-11-19 DIAGNOSIS — Z510 Encounter for antineoplastic radiation therapy: Secondary | ICD-10-CM | POA: Diagnosis not present

## 2011-11-19 DIAGNOSIS — C50419 Malignant neoplasm of upper-outer quadrant of unspecified female breast: Secondary | ICD-10-CM | POA: Diagnosis not present

## 2011-11-19 DIAGNOSIS — Z9010 Acquired absence of unspecified breast and nipple: Secondary | ICD-10-CM | POA: Diagnosis not present

## 2011-11-20 ENCOUNTER — Telehealth: Payer: Self-pay | Admitting: Oncology

## 2011-11-20 ENCOUNTER — Other Ambulatory Visit (HOSPITAL_BASED_OUTPATIENT_CLINIC_OR_DEPARTMENT_OTHER): Payer: Medicare Other | Admitting: Lab

## 2011-11-20 ENCOUNTER — Ambulatory Visit (HOSPITAL_BASED_OUTPATIENT_CLINIC_OR_DEPARTMENT_OTHER): Payer: Medicare Other | Admitting: Oncology

## 2011-11-20 ENCOUNTER — Encounter (INDEPENDENT_AMBULATORY_CARE_PROVIDER_SITE_OTHER): Payer: Self-pay | Admitting: General Surgery

## 2011-11-20 ENCOUNTER — Ambulatory Visit (INDEPENDENT_AMBULATORY_CARE_PROVIDER_SITE_OTHER): Payer: Medicare Other | Admitting: General Surgery

## 2011-11-20 ENCOUNTER — Other Ambulatory Visit: Payer: Self-pay | Admitting: Physician Assistant

## 2011-11-20 VITALS — BP 146/72 | HR 70 | Temp 98.3°F | Resp 18 | Ht 59.0 in | Wt 185.8 lb

## 2011-11-20 VITALS — BP 119/68 | HR 89 | Temp 98.6°F | Ht 59.0 in | Wt 185.4 lb

## 2011-11-20 DIAGNOSIS — Z170 Estrogen receptor positive status [ER+]: Secondary | ICD-10-CM | POA: Diagnosis not present

## 2011-11-20 DIAGNOSIS — M949 Disorder of cartilage, unspecified: Secondary | ICD-10-CM

## 2011-11-20 DIAGNOSIS — E559 Vitamin D deficiency, unspecified: Secondary | ICD-10-CM | POA: Diagnosis not present

## 2011-11-20 DIAGNOSIS — C50919 Malignant neoplasm of unspecified site of unspecified female breast: Secondary | ICD-10-CM

## 2011-11-20 DIAGNOSIS — Z853 Personal history of malignant neoplasm of breast: Secondary | ICD-10-CM

## 2011-11-20 DIAGNOSIS — M899 Disorder of bone, unspecified: Secondary | ICD-10-CM | POA: Diagnosis not present

## 2011-11-20 DIAGNOSIS — C50912 Malignant neoplasm of unspecified site of left female breast: Secondary | ICD-10-CM

## 2011-11-20 LAB — CBC WITH DIFFERENTIAL/PLATELET
Basophils Absolute: 0 10*3/uL (ref 0.0–0.1)
Eosinophils Absolute: 0.2 10*3/uL (ref 0.0–0.5)
HGB: 12.9 g/dL (ref 11.6–15.9)
MCV: 94.3 fL (ref 79.5–101.0)
MONO%: 6.1 % (ref 0.0–14.0)
NEUT#: 3.4 10*3/uL (ref 1.5–6.5)
RDW: 14.1 % (ref 11.2–14.5)
lymph#: 1.1 10*3/uL (ref 0.9–3.3)

## 2011-11-20 LAB — COMPREHENSIVE METABOLIC PANEL
Albumin: 3.9 g/dL (ref 3.5–5.2)
CO2: 27 mEq/L (ref 19–32)
Glucose, Bld: 191 mg/dL — ABNORMAL HIGH (ref 70–99)
Potassium: 3.6 mEq/L (ref 3.5–5.3)
Sodium: 141 mEq/L (ref 135–145)
Total Protein: 5.8 g/dL — ABNORMAL LOW (ref 6.0–8.3)

## 2011-11-20 LAB — LACTATE DEHYDROGENASE: LDH: 134 U/L (ref 94–250)

## 2011-11-20 MED ORDER — ANASTROZOLE 1 MG PO TABS: 1.0000 mg | ORAL_TABLET | Freq: Every day | ORAL | Status: DC

## 2011-11-20 NOTE — Progress Notes (Signed)
Operation:  Left mastectomy and sentinel lymph node biopsy  Date:  June 07, 2011  Stage:T3N1a  Hormone receptor status: Positive  HPI:  She is here for long-term followup of her left breast cancer. She is currently undergoing radiation therapy. She saw Dr. Donnie Coffin earlier today.  She denies any chest wall nodules. She does have some discomfort in the triceps area for left arm.  PE:  Gen.-she is in no acute distress.  Left chest wall-well-healed scar with no nodules. Some early radiation skin changes noted. Left axillary scar is clean and intact.  Assessment:  Left breast cancer-no clinical evidence of recurrence. Currently undergoing radiation therapy. Will start Arimidex following that.  Plan:  Return visit 3 months.

## 2011-11-20 NOTE — Patient Instructions (Signed)

## 2011-11-20 NOTE — Patient Instructions (Signed)
Call us if you notice any nodules on your chest wall.

## 2011-11-20 NOTE — Telephone Encounter (Signed)
gve the pt her April 2013 appt calendar along with the April 2013 appt calendar

## 2011-11-21 DIAGNOSIS — Z9010 Acquired absence of unspecified breast and nipple: Secondary | ICD-10-CM | POA: Diagnosis not present

## 2011-11-21 DIAGNOSIS — Z510 Encounter for antineoplastic radiation therapy: Secondary | ICD-10-CM | POA: Diagnosis not present

## 2011-11-21 DIAGNOSIS — C50419 Malignant neoplasm of upper-outer quadrant of unspecified female breast: Secondary | ICD-10-CM | POA: Diagnosis not present

## 2011-11-22 DIAGNOSIS — Z9010 Acquired absence of unspecified breast and nipple: Secondary | ICD-10-CM | POA: Diagnosis not present

## 2011-11-22 DIAGNOSIS — C50419 Malignant neoplasm of upper-outer quadrant of unspecified female breast: Secondary | ICD-10-CM | POA: Diagnosis not present

## 2011-11-22 DIAGNOSIS — Z510 Encounter for antineoplastic radiation therapy: Secondary | ICD-10-CM | POA: Diagnosis not present

## 2011-11-23 DIAGNOSIS — Z9010 Acquired absence of unspecified breast and nipple: Secondary | ICD-10-CM | POA: Diagnosis not present

## 2011-11-23 DIAGNOSIS — Z510 Encounter for antineoplastic radiation therapy: Secondary | ICD-10-CM | POA: Diagnosis not present

## 2011-11-23 DIAGNOSIS — C50419 Malignant neoplasm of upper-outer quadrant of unspecified female breast: Secondary | ICD-10-CM | POA: Diagnosis not present

## 2011-11-26 DIAGNOSIS — Z510 Encounter for antineoplastic radiation therapy: Secondary | ICD-10-CM | POA: Diagnosis not present

## 2011-11-26 DIAGNOSIS — Z9010 Acquired absence of unspecified breast and nipple: Secondary | ICD-10-CM | POA: Diagnosis not present

## 2011-11-26 DIAGNOSIS — C50419 Malignant neoplasm of upper-outer quadrant of unspecified female breast: Secondary | ICD-10-CM | POA: Diagnosis not present

## 2011-11-27 DIAGNOSIS — Z510 Encounter for antineoplastic radiation therapy: Secondary | ICD-10-CM | POA: Diagnosis not present

## 2011-11-27 DIAGNOSIS — C50419 Malignant neoplasm of upper-outer quadrant of unspecified female breast: Secondary | ICD-10-CM | POA: Diagnosis not present

## 2011-11-27 DIAGNOSIS — Z9010 Acquired absence of unspecified breast and nipple: Secondary | ICD-10-CM | POA: Diagnosis not present

## 2011-11-28 DIAGNOSIS — Z9010 Acquired absence of unspecified breast and nipple: Secondary | ICD-10-CM | POA: Diagnosis not present

## 2011-11-28 DIAGNOSIS — Z510 Encounter for antineoplastic radiation therapy: Secondary | ICD-10-CM | POA: Diagnosis not present

## 2011-11-28 DIAGNOSIS — C50419 Malignant neoplasm of upper-outer quadrant of unspecified female breast: Secondary | ICD-10-CM | POA: Diagnosis not present

## 2011-11-29 DIAGNOSIS — Z510 Encounter for antineoplastic radiation therapy: Secondary | ICD-10-CM | POA: Diagnosis not present

## 2011-11-29 DIAGNOSIS — Z9010 Acquired absence of unspecified breast and nipple: Secondary | ICD-10-CM | POA: Diagnosis not present

## 2011-11-29 DIAGNOSIS — C50419 Malignant neoplasm of upper-outer quadrant of unspecified female breast: Secondary | ICD-10-CM | POA: Diagnosis not present

## 2011-11-30 DIAGNOSIS — C50419 Malignant neoplasm of upper-outer quadrant of unspecified female breast: Secondary | ICD-10-CM | POA: Diagnosis not present

## 2011-11-30 DIAGNOSIS — Z510 Encounter for antineoplastic radiation therapy: Secondary | ICD-10-CM | POA: Diagnosis not present

## 2011-11-30 DIAGNOSIS — Z9010 Acquired absence of unspecified breast and nipple: Secondary | ICD-10-CM | POA: Diagnosis not present

## 2011-12-03 DIAGNOSIS — Z9010 Acquired absence of unspecified breast and nipple: Secondary | ICD-10-CM | POA: Diagnosis not present

## 2011-12-03 DIAGNOSIS — Z510 Encounter for antineoplastic radiation therapy: Secondary | ICD-10-CM | POA: Diagnosis not present

## 2011-12-03 DIAGNOSIS — C50419 Malignant neoplasm of upper-outer quadrant of unspecified female breast: Secondary | ICD-10-CM | POA: Diagnosis not present

## 2011-12-04 DIAGNOSIS — C50419 Malignant neoplasm of upper-outer quadrant of unspecified female breast: Secondary | ICD-10-CM | POA: Diagnosis not present

## 2011-12-04 DIAGNOSIS — Z510 Encounter for antineoplastic radiation therapy: Secondary | ICD-10-CM | POA: Diagnosis not present

## 2011-12-04 DIAGNOSIS — Z9010 Acquired absence of unspecified breast and nipple: Secondary | ICD-10-CM | POA: Diagnosis not present

## 2011-12-05 DIAGNOSIS — Z9010 Acquired absence of unspecified breast and nipple: Secondary | ICD-10-CM | POA: Diagnosis not present

## 2011-12-05 DIAGNOSIS — C50419 Malignant neoplasm of upper-outer quadrant of unspecified female breast: Secondary | ICD-10-CM | POA: Diagnosis not present

## 2011-12-05 DIAGNOSIS — Z510 Encounter for antineoplastic radiation therapy: Secondary | ICD-10-CM | POA: Diagnosis not present

## 2011-12-06 DIAGNOSIS — Z510 Encounter for antineoplastic radiation therapy: Secondary | ICD-10-CM | POA: Diagnosis not present

## 2011-12-06 DIAGNOSIS — Z9010 Acquired absence of unspecified breast and nipple: Secondary | ICD-10-CM | POA: Diagnosis not present

## 2011-12-06 DIAGNOSIS — C50419 Malignant neoplasm of upper-outer quadrant of unspecified female breast: Secondary | ICD-10-CM | POA: Diagnosis not present

## 2011-12-07 DIAGNOSIS — C50419 Malignant neoplasm of upper-outer quadrant of unspecified female breast: Secondary | ICD-10-CM | POA: Diagnosis not present

## 2011-12-07 DIAGNOSIS — Z9010 Acquired absence of unspecified breast and nipple: Secondary | ICD-10-CM | POA: Diagnosis not present

## 2011-12-07 DIAGNOSIS — Z510 Encounter for antineoplastic radiation therapy: Secondary | ICD-10-CM | POA: Diagnosis not present

## 2011-12-10 DIAGNOSIS — C50419 Malignant neoplasm of upper-outer quadrant of unspecified female breast: Secondary | ICD-10-CM | POA: Diagnosis not present

## 2011-12-10 DIAGNOSIS — Z510 Encounter for antineoplastic radiation therapy: Secondary | ICD-10-CM | POA: Diagnosis not present

## 2011-12-10 DIAGNOSIS — Z9010 Acquired absence of unspecified breast and nipple: Secondary | ICD-10-CM | POA: Diagnosis not present

## 2011-12-11 DIAGNOSIS — Z9010 Acquired absence of unspecified breast and nipple: Secondary | ICD-10-CM | POA: Diagnosis not present

## 2011-12-11 DIAGNOSIS — Z510 Encounter for antineoplastic radiation therapy: Secondary | ICD-10-CM | POA: Diagnosis not present

## 2011-12-11 DIAGNOSIS — C50419 Malignant neoplasm of upper-outer quadrant of unspecified female breast: Secondary | ICD-10-CM | POA: Diagnosis not present

## 2011-12-12 DIAGNOSIS — C50419 Malignant neoplasm of upper-outer quadrant of unspecified female breast: Secondary | ICD-10-CM | POA: Diagnosis not present

## 2011-12-12 DIAGNOSIS — Z510 Encounter for antineoplastic radiation therapy: Secondary | ICD-10-CM | POA: Diagnosis not present

## 2011-12-12 DIAGNOSIS — Z9010 Acquired absence of unspecified breast and nipple: Secondary | ICD-10-CM | POA: Diagnosis not present

## 2011-12-13 DIAGNOSIS — Z510 Encounter for antineoplastic radiation therapy: Secondary | ICD-10-CM | POA: Diagnosis not present

## 2011-12-13 DIAGNOSIS — C50419 Malignant neoplasm of upper-outer quadrant of unspecified female breast: Secondary | ICD-10-CM | POA: Diagnosis not present

## 2011-12-13 DIAGNOSIS — Z9010 Acquired absence of unspecified breast and nipple: Secondary | ICD-10-CM | POA: Diagnosis not present

## 2011-12-14 DIAGNOSIS — Z510 Encounter for antineoplastic radiation therapy: Secondary | ICD-10-CM | POA: Diagnosis not present

## 2011-12-14 DIAGNOSIS — L538 Other specified erythematous conditions: Secondary | ICD-10-CM | POA: Diagnosis not present

## 2011-12-14 DIAGNOSIS — C50919 Malignant neoplasm of unspecified site of unspecified female breast: Secondary | ICD-10-CM | POA: Diagnosis not present

## 2011-12-18 ENCOUNTER — Other Ambulatory Visit: Payer: Medicare Other

## 2011-12-18 ENCOUNTER — Ambulatory Visit
Admission: RE | Admit: 2011-12-18 | Discharge: 2011-12-18 | Disposition: A | Discharge status: Home / Self Care | Payer: Medicare Other | Source: Ambulatory Visit | Attending: Oncology | Admitting: Oncology

## 2011-12-18 DIAGNOSIS — E559 Vitamin D deficiency, unspecified: Secondary | ICD-10-CM

## 2011-12-18 DIAGNOSIS — C50912 Malignant neoplasm of unspecified site of left female breast: Secondary | ICD-10-CM

## 2011-12-18 DIAGNOSIS — Z13820 Encounter for screening for osteoporosis: Secondary | ICD-10-CM | POA: Diagnosis not present

## 2011-12-18 DIAGNOSIS — Z780 Asymptomatic menopausal state: Secondary | ICD-10-CM | POA: Diagnosis not present

## 2011-12-26 DIAGNOSIS — R7309 Other abnormal glucose: Secondary | ICD-10-CM | POA: Diagnosis not present

## 2011-12-26 DIAGNOSIS — Z79899 Other long term (current) drug therapy: Secondary | ICD-10-CM | POA: Diagnosis not present

## 2011-12-26 DIAGNOSIS — I10 Essential (primary) hypertension: Secondary | ICD-10-CM | POA: Diagnosis not present

## 2012-01-02 DIAGNOSIS — Z0000 Encounter for general adult medical examination without abnormal findings: Secondary | ICD-10-CM | POA: Diagnosis not present

## 2012-01-02 DIAGNOSIS — R7309 Other abnormal glucose: Secondary | ICD-10-CM | POA: Diagnosis not present

## 2012-01-02 DIAGNOSIS — E785 Hyperlipidemia, unspecified: Secondary | ICD-10-CM | POA: Diagnosis not present

## 2012-01-02 DIAGNOSIS — C50919 Malignant neoplasm of unspecified site of unspecified female breast: Secondary | ICD-10-CM | POA: Diagnosis not present

## 2012-01-02 DIAGNOSIS — I10 Essential (primary) hypertension: Secondary | ICD-10-CM | POA: Diagnosis not present

## 2012-01-18 DIAGNOSIS — C50919 Malignant neoplasm of unspecified site of unspecified female breast: Secondary | ICD-10-CM | POA: Diagnosis not present

## 2012-01-18 DIAGNOSIS — Z923 Personal history of irradiation: Secondary | ICD-10-CM | POA: Diagnosis not present

## 2012-01-21 ENCOUNTER — Ambulatory Visit (HOSPITAL_BASED_OUTPATIENT_CLINIC_OR_DEPARTMENT_OTHER): Payer: Medicare Other | Admitting: Oncology

## 2012-01-21 ENCOUNTER — Other Ambulatory Visit (HOSPITAL_BASED_OUTPATIENT_CLINIC_OR_DEPARTMENT_OTHER): Payer: Medicare Other | Admitting: Lab

## 2012-01-21 VITALS — BP 133/72 | HR 83 | Temp 98.6°F | Ht 59.0 in | Wt 186.6 lb

## 2012-01-21 DIAGNOSIS — Z170 Estrogen receptor positive status [ER+]: Secondary | ICD-10-CM | POA: Diagnosis not present

## 2012-01-21 DIAGNOSIS — E559 Vitamin D deficiency, unspecified: Secondary | ICD-10-CM

## 2012-01-21 DIAGNOSIS — C50919 Malignant neoplasm of unspecified site of unspecified female breast: Secondary | ICD-10-CM | POA: Diagnosis not present

## 2012-01-21 DIAGNOSIS — M899 Disorder of bone, unspecified: Secondary | ICD-10-CM | POA: Diagnosis not present

## 2012-01-21 DIAGNOSIS — C50119 Malignant neoplasm of central portion of unspecified female breast: Secondary | ICD-10-CM

## 2012-01-21 DIAGNOSIS — C50912 Malignant neoplasm of unspecified site of left female breast: Secondary | ICD-10-CM

## 2012-01-21 LAB — CBC WITH DIFFERENTIAL/PLATELET
BASO%: 0.8 % (ref 0.0–2.0)
Basophils Absolute: 0 10*3/uL (ref 0.0–0.1)
HCT: 37.6 % (ref 34.8–46.6)
HGB: 12.6 g/dL (ref 11.6–15.9)
MONO#: 0.5 10*3/uL (ref 0.1–0.9)
NEUT#: 4.1 10*3/uL (ref 1.5–6.5)
NEUT%: 69.2 % (ref 38.4–76.8)
WBC: 5.9 10*3/uL (ref 3.9–10.3)
lymph#: 1 10*3/uL (ref 0.9–3.3)

## 2012-01-21 MED ORDER — POTASSIUM CHLORIDE CRYS ER 20 MEQ PO TBCR: 20.0000 meq | EXTENDED_RELEASE_TABLET | Freq: Every day | ORAL | Status: DC

## 2012-01-21 MED ORDER — ALENDRONATE SODIUM 35 MG PO TABS: 35.0000 mg | ORAL_TABLET | ORAL | Status: DC

## 2012-01-21 NOTE — Progress Notes (Signed)
Hematology and Oncology Follow Up Visit  Christina Horn [id number] May 09, 1946 66 y.o. 01/21/2012 9:59 AM PCP Dr Fayrene Fearing kim Dr s Michell Heinrich  Principle Diagnosis: 66 yo woman with H xof T3N1 er/pr+ lt breast cancer , Oncotype score of 28, s/p lt mrm with node dissection. S/p 4 cycles of TC chemotherapy, s/p chest wall xrt completed 12/14/11, on arimidex.  Interim History:  There have been no intercurrent illness, hospitalizations or medication changes.  Medications: I have reviewed the patient's current medications.  Allergies: No Known Allergies  Past Medical History, Surgical history, Social history, and Family History were reviewed and updated.  Review of Systems: Constitutional:  Negative for fever, chills, night sweats, anorexia, weight loss, pain. Cardiovascular: negative Respiratory: negative Neurological: negative Dermatological: negative ENT: negative Skin Gastrointestinal: no abdominal pain, change in bowel habits, or black or bloody stools Genito-Urinary: no dysuria, trouble voiding, or hematuria Hematological and Lymphatic: negative Breast: negative Musculoskeletal: negative Remaining ROS negative.  Physical Exam: Blood pressure 133/72, pulse 83, temperature 98.6 F (37 C), temperature source Oral, height 4\' 11"  (1.499 m), weight 186 lb 9.6 oz (84.641 kg). ECOG: 0 General appearance: alert, cooperative and appears stated age Head: Normocephalic, without obvious abnormality, atraumatic Neck: no adenopathy, no carotid bruit, no JVD, supple, symmetrical, trachea midline and thyroid not enlarged, symmetric, no tenderness/mass/nodules Lymph nodes: Cervical, supraclavicular, and axillary nodes normal. Cardiac : regular rate and rhythm, no murmurs or gallops Pulmonary:clear to auscultation bilaterally and normal percussion bilaterally Breasts: inspection negative, no nipple discharge or bleeding, no masses or nodularity palpable, cw exam unremarkable, NED Abdomen:soft,  non-tender; bowel sounds normal; no masses,  no organomegaly Extremities negative Neuro: alert, oriented, normal speech, no focal findings or movement disorder noted  Lab Results: Lab Results  Component Value Date   WBC 5.9 01/21/2012   HGB 12.6 01/21/2012   HCT 37.6 01/21/2012   MCV 92.5 01/21/2012   PLT 247 01/21/2012     Chemistry      Component Value Date/Time   NA 141 11/20/2011 1034   K 3.6 11/20/2011 1034   CL 101 11/20/2011 1034   CO2 27 11/20/2011 1034   BUN 12 11/20/2011 1034   CREATININE 0.82 11/20/2011 1034      Component Value Date/Time   CALCIUM 9.2 11/20/2011 1034   ALKPHOS 53 11/20/2011 1034   AST 14 11/20/2011 1034   ALT 13 11/20/2011 1034   BILITOT 0.3 11/20/2011 1034      .pathology. Radiological Studies: chest X-ray n/a Mammogram Due 7/13 Bone density Mild osteopenia  Impression and Plan: Doing well, I have recommended low dose fosamax for osteopenia.willf/u in 6 months with repeat mammogram  More than 50% of the visit was spent in patient-related counselling   Pierce Crane, MD 4/8/20139:59 AM

## 2012-01-21 NOTE — Patient Instructions (Signed)
2000 units of vitamin d3 per day

## 2012-01-23 ENCOUNTER — Other Ambulatory Visit: Payer: Medicare Other | Admitting: Lab

## 2012-01-23 ENCOUNTER — Ambulatory Visit: Payer: Medicare Other | Admitting: Oncology

## 2012-01-25 DIAGNOSIS — G4733 Obstructive sleep apnea (adult) (pediatric): Secondary | ICD-10-CM | POA: Diagnosis not present

## 2012-02-19 ENCOUNTER — Encounter (INDEPENDENT_AMBULATORY_CARE_PROVIDER_SITE_OTHER): Payer: Self-pay | Admitting: General Surgery

## 2012-02-19 ENCOUNTER — Ambulatory Visit (INDEPENDENT_AMBULATORY_CARE_PROVIDER_SITE_OTHER): Payer: Medicare Other | Admitting: General Surgery

## 2012-02-19 VITALS — BP 138/58 | HR 84 | Temp 97.2°F | Resp 20 | Ht 59.0 in | Wt 187.0 lb

## 2012-02-19 DIAGNOSIS — Z853 Personal history of malignant neoplasm of breast: Secondary | ICD-10-CM

## 2012-02-19 NOTE — Progress Notes (Signed)
Operation:  Left mastectomy and sentinel lymph node biopsy  Date:  June 07, 2011  Stage:T3N1a  Hormone receptor status: Positive  HPI:  She is here for long-term followup of her left breast cancer.   She has completed her XRT.  She is on Arimidex and has some myalgias with that.  No left chest wall nodules.  No right breast masses.  No adenopathy.  PE:  Gen.-she is in no acute distress.  Left chest wall-well-healed scar with no nodules.  Right breast-no dominant masses or suspicious changes.  Lymph nodes-no palpable axillary, supraclavicular, or cervical adenopathy.   Assessment:  Left breast cancer-no clinical evidence of recurrence.   Plan:  Return visit 3 months.

## 2012-02-19 NOTE — Patient Instructions (Signed)
Call if you feel any nodules on your chest wall of mass in your right breast.

## 2012-03-11 DIAGNOSIS — R05 Cough: Secondary | ICD-10-CM | POA: Diagnosis not present

## 2012-03-11 DIAGNOSIS — R062 Wheezing: Secondary | ICD-10-CM | POA: Diagnosis not present

## 2012-03-11 DIAGNOSIS — R5383 Other fatigue: Secondary | ICD-10-CM | POA: Diagnosis not present

## 2012-03-11 DIAGNOSIS — J209 Acute bronchitis, unspecified: Secondary | ICD-10-CM | POA: Diagnosis not present

## 2012-03-17 DIAGNOSIS — H2510 Age-related nuclear cataract, unspecified eye: Secondary | ICD-10-CM | POA: Diagnosis not present

## 2012-04-20 ENCOUNTER — Other Ambulatory Visit: Payer: Self-pay | Admitting: Oncology

## 2012-04-21 DIAGNOSIS — Z853 Personal history of malignant neoplasm of breast: Secondary | ICD-10-CM | POA: Diagnosis not present

## 2012-04-25 DIAGNOSIS — E785 Hyperlipidemia, unspecified: Secondary | ICD-10-CM | POA: Diagnosis not present

## 2012-04-25 DIAGNOSIS — R7309 Other abnormal glucose: Secondary | ICD-10-CM | POA: Diagnosis not present

## 2012-04-30 DIAGNOSIS — E7800 Pure hypercholesterolemia, unspecified: Secondary | ICD-10-CM | POA: Diagnosis not present

## 2012-04-30 DIAGNOSIS — E119 Type 2 diabetes mellitus without complications: Secondary | ICD-10-CM | POA: Diagnosis not present

## 2012-04-30 DIAGNOSIS — E669 Obesity, unspecified: Secondary | ICD-10-CM | POA: Diagnosis not present

## 2012-04-30 DIAGNOSIS — I10 Essential (primary) hypertension: Secondary | ICD-10-CM | POA: Diagnosis not present

## 2012-05-07 ENCOUNTER — Encounter (INDEPENDENT_AMBULATORY_CARE_PROVIDER_SITE_OTHER): Payer: Medicare Other | Admitting: General Surgery

## 2012-05-27 ENCOUNTER — Encounter (INDEPENDENT_AMBULATORY_CARE_PROVIDER_SITE_OTHER): Payer: Self-pay | Admitting: General Surgery

## 2012-05-27 ENCOUNTER — Ambulatory Visit (INDEPENDENT_AMBULATORY_CARE_PROVIDER_SITE_OTHER): Payer: Medicare Other | Admitting: General Surgery

## 2012-05-27 VITALS — BP 146/62 | HR 83 | Temp 98.6°F | Ht 59.0 in | Wt 186.2 lb

## 2012-05-27 DIAGNOSIS — Z853 Personal history of malignant neoplasm of breast: Secondary | ICD-10-CM | POA: Diagnosis not present

## 2012-05-27 DIAGNOSIS — R350 Frequency of micturition: Secondary | ICD-10-CM | POA: Diagnosis not present

## 2012-05-27 DIAGNOSIS — N390 Urinary tract infection, site not specified: Secondary | ICD-10-CM | POA: Diagnosis not present

## 2012-05-27 NOTE — Progress Notes (Signed)
Operation:  Left mastectomy and sentinel lymph node biopsy  Date:  June 07, 2011  Stage:T3N1a  Hormone receptor status: Positive  HPI:  She is here for long-term followup of her left breast cancer.    She is on Arimidex and has some myalgias with that.  No left chest wall nodules.  No right breast masses.  No adenopathy.  She has some intermittent left upper chest wall discomfort.  She had a mammogram recently but I do not have the results.  PE:  Gen.-she is in no acute distress.  Left chest wall-well-healed scar with no nodules.  Right breast-no dominant masses or suspicious changes.  Lymph nodes-no palpable axillary, supraclavicular, or cervical adenopathy.  Abd-no hepatomegaly   Assessment:  Left breast cancer-no clinical evidence of recurrence.   Plan:  Return visit 3 months.  Obtain recent mammogram result.

## 2012-05-27 NOTE — Patient Instructions (Signed)
Call if you feel any nodules in your right breast or on your left chest wall.

## 2012-06-22 ENCOUNTER — Other Ambulatory Visit: Payer: Self-pay | Admitting: Oncology

## 2012-06-22 DIAGNOSIS — C50912 Malignant neoplasm of unspecified site of left female breast: Secondary | ICD-10-CM

## 2012-07-22 ENCOUNTER — Ambulatory Visit: Payer: Medicare Other | Admitting: Oncology

## 2012-07-22 ENCOUNTER — Other Ambulatory Visit: Payer: Medicare Other | Admitting: Lab

## 2012-07-30 ENCOUNTER — Ambulatory Visit (HOSPITAL_BASED_OUTPATIENT_CLINIC_OR_DEPARTMENT_OTHER): Payer: Medicare Other | Admitting: Oncology

## 2012-07-30 ENCOUNTER — Other Ambulatory Visit (HOSPITAL_BASED_OUTPATIENT_CLINIC_OR_DEPARTMENT_OTHER): Payer: Medicare Other | Admitting: Lab

## 2012-07-30 ENCOUNTER — Telehealth: Payer: Self-pay | Admitting: *Deleted

## 2012-07-30 VITALS — BP 155/75 | HR 83 | Temp 99.2°F | Resp 20 | Ht 59.0 in | Wt 189.5 lb

## 2012-07-30 DIAGNOSIS — M899 Disorder of bone, unspecified: Secondary | ICD-10-CM

## 2012-07-30 DIAGNOSIS — M949 Disorder of cartilage, unspecified: Secondary | ICD-10-CM | POA: Diagnosis not present

## 2012-07-30 DIAGNOSIS — C50119 Malignant neoplasm of central portion of unspecified female breast: Secondary | ICD-10-CM

## 2012-07-30 DIAGNOSIS — C50919 Malignant neoplasm of unspecified site of unspecified female breast: Secondary | ICD-10-CM

## 2012-07-30 DIAGNOSIS — E559 Vitamin D deficiency, unspecified: Secondary | ICD-10-CM

## 2012-07-30 DIAGNOSIS — Z170 Estrogen receptor positive status [ER+]: Secondary | ICD-10-CM

## 2012-07-30 DIAGNOSIS — C50912 Malignant neoplasm of unspecified site of left female breast: Secondary | ICD-10-CM

## 2012-07-30 LAB — CBC WITH DIFFERENTIAL/PLATELET
Basophils Absolute: 0.1 10*3/uL (ref 0.0–0.1)
Eosinophils Absolute: 0.2 10*3/uL (ref 0.0–0.5)
HGB: 12.9 g/dL (ref 11.6–15.9)
MCV: 97.6 fL (ref 79.5–101.0)
MONO#: 0.6 10*3/uL (ref 0.1–0.9)
MONO%: 7.1 % (ref 0.0–14.0)
NEUT#: 6.5 10*3/uL (ref 1.5–6.5)
Platelets: 260 10*3/uL (ref 145–400)
RDW: 14 % (ref 11.2–14.5)
WBC: 8.8 10*3/uL (ref 3.9–10.3)

## 2012-07-30 LAB — COMPREHENSIVE METABOLIC PANEL (CC13)
Alkaline Phosphatase: 65 U/L (ref 40–150)
BUN: 12 mg/dL (ref 7.0–26.0)
CO2: 28 mEq/L (ref 22–29)
Chloride: 100 mEq/L (ref 98–107)
Glucose: 167 mg/dl — ABNORMAL HIGH (ref 70–99)
Potassium: 3.5 mEq/L (ref 3.5–5.1)
Total Bilirubin: 0.4 mg/dL (ref 0.20–1.20)

## 2012-07-30 LAB — CANCER ANTIGEN 27.29: CA 27.29: 14 U/mL (ref 0–39)

## 2012-07-30 NOTE — Telephone Encounter (Signed)
[id number] starting at 9:00am

## 2012-07-30 NOTE — Progress Notes (Signed)
Hematology and Oncology Follow Up Visit  Christina Horn [id number] April 22, 1946 66 y.o. 07/30/2012 10:53 AM PCP Dr Fayrene Fearing kim Dr s Michell Heinrich  Principle Diagnosis: 66 yo woman with H xof T3N1 er/pr+ lt breast cancer , Oncotype score of 28, s/p lt mrm with node dissection. S/p 4 cycles of TC chemotherapy, s/p chest wall xrt completed 12/14/11, on arimidex.  Interim History:  There have been no intercurrent illness, hospitalizations or medication changes. She is tolerating Arimidex well. She had a bone density test in March which was essentially normal. In the spine and showed a slight osteopenia in the hips. She is on low-dose Fosamax as a result. A mammogram in July of this year was normal as well. She has no other other complaints. She is taking some vitamin D. She is concerned about her weight and lack of exercise and try to correct that issue.  Medications: I have reviewed the patient's current medications.  Allergies: No Known Allergies  Past Medical History, Surgical history, Social history, and Family History were reviewed and updated.  Review of Systems: Constitutional:  Negative for fever, chills, night sweats, anorexia, weight loss, pain. Cardiovascular: negative Respiratory: negative Neurological: negative Dermatological: negative ENT: negative Skin Gastrointestinal: no abdominal pain, change in bowel habits, or black or bloody stools Genito-Urinary: no dysuria, trouble voiding, or hematuria Hematological and Lymphatic: negative Breast: negative Musculoskeletal: negative Remaining ROS negative.  Physical Exam: Blood pressure 155/75, pulse 83, temperature 99.2 F (37.3 C), temperature source Oral, resp. rate 20, height 4\' 11"  (1.499 m), weight 189 lb 8 oz (85.957 kg). ECOG: 0 General appearance: alert, cooperative and appears stated age Head: Normocephalic, without obvious abnormality, atraumatic Neck: no adenopathy, no carotid bruit, no JVD, supple, symmetrical, trachea  midline and thyroid not enlarged, symmetric, no tenderness/mass/nodules Lymph nodes: Cervical, supraclavicular, and axillary nodes normal. Cardiac : regular rate and rhythm, no murmurs or gallops Pulmonary:clear to auscultation bilaterally and normal percussion bilaterally Breasts: inspection negative, no nipple discharge or bleeding, no masses or nodularity palpable, cw exam unremarkable, NED Abdomen:soft, non-tender; bowel sounds normal; no masses,  no organomegaly Extremities negative Neuro: alert, oriented, normal speech, no focal findings or movement disorder noted  Lab Results: Lab Results  Component Value Date   WBC 8.8 07/30/2012   HGB 12.9 07/30/2012   HCT 38.8 07/30/2012   MCV 97.6 07/30/2012   PLT 260 07/30/2012     Chemistry      Component Value Date/Time   NA 140 07/30/2012 0939   NA 141 11/20/2011 1034   K 3.5 07/30/2012 0939   K 3.6 11/20/2011 1034   CL 100 07/30/2012 0939   CL 101 11/20/2011 1034   CO2 28 07/30/2012 0939   CO2 27 11/20/2011 1034   BUN 12.0 07/30/2012 0939   BUN 12 11/20/2011 1034   CREATININE 0.8 07/30/2012 0939   CREATININE 0.82 11/20/2011 1034      Component Value Date/Time   CALCIUM 9.7 07/30/2012 0939   CALCIUM 9.2 11/20/2011 1034   ALKPHOS 65 07/30/2012 0939   ALKPHOS 53 11/20/2011 1034   AST 14 07/30/2012 0939   AST 14 11/20/2011 1034   ALT 22 07/30/2012 0939   ALT 13 11/20/2011 1034   BILITOT 0.40 07/30/2012 0939   BILITOT 0.3 11/20/2011 1034      .pathology. Radiological Studies: chest X-ray n/a Mammogram Due 7/13 Bone density   Impression and Plan: She is doing well. I will see her in a years time. Dr. Abbey Chatters  is seeing her every  3 months.,  cytokeratin we need to see her more often than once a year. She will have her mammograms updated in July of next year.   More than 50% of the visit was spent in patient-related counselling   Pierce Crane, MD 10/16/201310:53 AM

## 2012-08-28 ENCOUNTER — Encounter (INDEPENDENT_AMBULATORY_CARE_PROVIDER_SITE_OTHER): Payer: Medicare Other | Admitting: General Surgery

## 2012-09-01 ENCOUNTER — Ambulatory Visit (INDEPENDENT_AMBULATORY_CARE_PROVIDER_SITE_OTHER): Payer: Medicare Other | Admitting: General Surgery

## 2012-09-01 ENCOUNTER — Encounter (INDEPENDENT_AMBULATORY_CARE_PROVIDER_SITE_OTHER): Payer: Self-pay | Admitting: General Surgery

## 2012-09-01 VITALS — BP 128/82 | HR 74 | Temp 98.0°F | Ht 59.0 in | Wt 187.0 lb

## 2012-09-01 DIAGNOSIS — E119 Type 2 diabetes mellitus without complications: Secondary | ICD-10-CM | POA: Diagnosis not present

## 2012-09-01 DIAGNOSIS — Z853 Personal history of malignant neoplasm of breast: Secondary | ICD-10-CM | POA: Diagnosis not present

## 2012-09-01 DIAGNOSIS — I10 Essential (primary) hypertension: Secondary | ICD-10-CM | POA: Diagnosis not present

## 2012-09-01 NOTE — Progress Notes (Signed)
Operation:  Left mastectomy and sentinel lymph node biopsy  Date:  June 07, 2011  Stage:T3N1a  Hormone receptor status: Positive  HPI:  She is here for long-term followup of her left breast cancer.    She is on Arimidex.  No left chest wall nodules.  No right breast masses.  No adenopathy.  Her right mammogram from July 2013 was a BIRADS 1.  PE:  Gen.-she is in no acute distress.  Left chest wall-well-healed scar with no nodules.  Right breast-no dominant masses or suspicious changes.  Lymph nodes-no palpable axillary, supraclavicular, or cervical adenopathy.  Abd-no hepatomegaly   Assessment:  Left breast cancer-no clinical evidence of recurrence.   Plan:  Return visit 3 months.

## 2012-09-01 NOTE — Patient Instructions (Signed)
Call if you feel any new nodules in your breast or chest wall.

## 2012-09-04 DIAGNOSIS — Z23 Encounter for immunization: Secondary | ICD-10-CM | POA: Diagnosis not present

## 2012-09-04 DIAGNOSIS — E559 Vitamin D deficiency, unspecified: Secondary | ICD-10-CM | POA: Diagnosis not present

## 2012-09-04 DIAGNOSIS — I10 Essential (primary) hypertension: Secondary | ICD-10-CM | POA: Diagnosis not present

## 2012-09-04 DIAGNOSIS — R7309 Other abnormal glucose: Secondary | ICD-10-CM | POA: Diagnosis not present

## 2012-09-04 DIAGNOSIS — C50919 Malignant neoplasm of unspecified site of unspecified female breast: Secondary | ICD-10-CM | POA: Diagnosis not present

## 2012-09-21 ENCOUNTER — Other Ambulatory Visit: Payer: Self-pay | Admitting: Oncology

## 2012-09-21 DIAGNOSIS — C50912 Malignant neoplasm of unspecified site of left female breast: Secondary | ICD-10-CM

## 2012-11-13 NOTE — Progress Notes (Signed)
Hematology and Oncology Follow Up Visit  GHADA ABBETT [id number] 09/29/1946 67 y.o. 11/13/2012 9:21 PM PCP Dr Fayrene Fearing kim Dr s Michell Heinrich  Principle Diagnosis: 67 yo woman with H xof T3N1 er/pr+ lt breast cancer , Oncotype score of 28, s/p lt mrm with node dissection. S/p 4 cycles of TC chemotherapy, s/p chest wall xrt completed 12/14/11, on arimidex.  Interim History:  There have been no intercurrent illness, hospitalizations or medication changes.  Medications: I have reviewed the patient's current medications.  Allergies: No Known Allergies  Past Medical History, Surgical history, Social history, and Family History were reviewed and updated.  Review of Systems: Constitutional:  Negative for fever, chills, night sweats, anorexia, weight loss, pain. Cardiovascular: negative Respiratory: negative Neurological: negative Dermatological: negative ENT: negative Skin Gastrointestinal: no abdominal pain, change in bowel habits, or black or bloody stools Genito-Urinary: no dysuria, trouble voiding, or hematuria Hematological and Lymphatic: negative Breast: negative Musculoskeletal: negative Remaining ROS negative.  Physical Exam: Blood pressure 119/68, pulse 89, temperature 98.6 F (37 C), height 4\' 11"  (1.499 m), weight 185 lb 6.4 oz (84.097 kg). ECOG: 0 General appearance: alert, cooperative and appears stated age Head: Normocephalic, without obvious abnormality, atraumatic Neck: no adenopathy, no carotid bruit, no JVD, supple, symmetrical, trachea midline and thyroid not enlarged, symmetric, no tenderness/mass/nodules Lymph nodes: Cervical, supraclavicular, and axillary nodes normal. Cardiac : regular rate and rhythm, no murmurs or gallops Pulmonary:clear to auscultation bilaterally and normal percussion bilaterally Breasts: inspection negative, no nipple discharge or bleeding, no masses or nodularity palpable, cw exam unremarkable, NED Abdomen:soft, non-tender; bowel sounds  normal; no masses,  no organomegaly Extremities negative Neuro: alert, oriented, normal speech, no focal findings or movement disorder noted  Lab Results: Lab Results  Component Value Date   WBC 8.8 07/30/2012   HGB 12.9 07/30/2012   HCT 38.8 07/30/2012   MCV 97.6 07/30/2012   PLT 260 07/30/2012     Chemistry      Component Value Date/Time   NA 140 07/30/2012 0939   NA 141 11/20/2011 1034   K 3.5 07/30/2012 0939   K 3.6 11/20/2011 1034   CL 100 07/30/2012 0939   CL 101 11/20/2011 1034   CO2 28 07/30/2012 0939   CO2 27 11/20/2011 1034   BUN 12.0 07/30/2012 0939   BUN 12 11/20/2011 1034   CREATININE 0.8 07/30/2012 0939   CREATININE 0.82 11/20/2011 1034      Component Value Date/Time   CALCIUM 9.7 07/30/2012 0939   CALCIUM 9.2 11/20/2011 1034   ALKPHOS 65 07/30/2012 0939   ALKPHOS 53 11/20/2011 1034   AST 14 07/30/2012 0939   AST 14 11/20/2011 1034   ALT 22 07/30/2012 0939   ALT 13 11/20/2011 1034   BILITOT 0.40 07/30/2012 0939   BILITOT 0.3 11/20/2011 1034      .pathology. Radiological Studies: chest X-ray n/a Mammogram Due 7/13 Bone density Mild osteopenia  Impression and Plan: Doing well, I have recommended low dose fosamax for osteopenia.willf/u in 6 months with repeat mammogram  More than 50% of the visit was spent in patient-related counselling   Pierce Crane, MD

## 2012-11-29 ENCOUNTER — Other Ambulatory Visit: Payer: Self-pay

## 2012-12-01 DIAGNOSIS — I10 Essential (primary) hypertension: Secondary | ICD-10-CM | POA: Diagnosis not present

## 2012-12-01 DIAGNOSIS — E559 Vitamin D deficiency, unspecified: Secondary | ICD-10-CM | POA: Diagnosis not present

## 2012-12-01 DIAGNOSIS — R7309 Other abnormal glucose: Secondary | ICD-10-CM | POA: Diagnosis not present

## 2012-12-09 ENCOUNTER — Encounter (INDEPENDENT_AMBULATORY_CARE_PROVIDER_SITE_OTHER): Payer: Self-pay | Admitting: General Surgery

## 2012-12-09 ENCOUNTER — Ambulatory Visit (INDEPENDENT_AMBULATORY_CARE_PROVIDER_SITE_OTHER): Payer: Medicare Other | Admitting: General Surgery

## 2012-12-09 VITALS — BP 182/56 | HR 76 | Temp 97.5°F | Resp 16 | Ht 59.0 in | Wt 178.0 lb

## 2012-12-09 NOTE — Patient Instructions (Signed)
Call if you feel any lump in the right breast or the left chest wall.

## 2012-12-09 NOTE — Progress Notes (Signed)
Operation:  Left mastectomy and sentinel lymph node biopsy  Date:  June 07, 2011  Stage:T3N1a  Hormone receptor status: Positive  HPI:  She is here for long-term followup of her left breast cancer.  She had chest wall radiation as well as chemotherapy in the past.   She is on Arimidex.    No left chest wall nodules.  No right breast masses.  No adenopathy.  Her right mammogram from July 2013 was a BIRADS 1.  PE:  Gen.-she is in no acute distress.  Left chest wall-well-healed scar with no nodules.  Right breast-no dominant masses or suspicious changes.  Lymph nodes-no palpable axillary, supraclavicular, or cervical adenopathy.  Abd-no hepatomegaly   Assessment:  Left breast cancer-no clinical evidence of recurrence.   Plan:  Return visit 3 months.

## 2012-12-22 DIAGNOSIS — R0602 Shortness of breath: Secondary | ICD-10-CM | POA: Diagnosis not present

## 2013-01-03 ENCOUNTER — Encounter: Payer: Self-pay | Admitting: Oncology

## 2013-01-03 ENCOUNTER — Telehealth: Payer: Self-pay | Admitting: Oncology

## 2013-01-03 NOTE — Telephone Encounter (Signed)
lvm for pt regarding to OCT appt...mailed letter and appt schedule to pt....former pt of Dr. Donnie Coffin...r/s to Dr. Welton Flakes

## 2013-01-06 DIAGNOSIS — J449 Chronic obstructive pulmonary disease, unspecified: Secondary | ICD-10-CM | POA: Diagnosis not present

## 2013-01-07 ENCOUNTER — Telehealth: Payer: Self-pay | Admitting: *Deleted

## 2013-01-07 NOTE — Telephone Encounter (Signed)
Pt called stating that she received her letter & calendar w/ the NP info and requested to know who her doctor was going to be.  Informed her that it will be Dr. Welton Flakes and she is fine with that.  Also informed her that we would do the labs after the visit.  Cancelled the lab appt.

## 2013-03-03 ENCOUNTER — Encounter (INDEPENDENT_AMBULATORY_CARE_PROVIDER_SITE_OTHER): Payer: Self-pay | Admitting: General Surgery

## 2013-03-03 ENCOUNTER — Ambulatory Visit (INDEPENDENT_AMBULATORY_CARE_PROVIDER_SITE_OTHER): Payer: Medicare Other | Admitting: General Surgery

## 2013-03-03 VITALS — BP 110/58 | HR 76 | Temp 97.4°F | Resp 18 | Ht 59.0 in | Wt 191.2 lb

## 2013-03-03 DIAGNOSIS — Z853 Personal history of malignant neoplasm of breast: Secondary | ICD-10-CM

## 2013-03-03 NOTE — Progress Notes (Signed)
Operation:  Left mastectomy and sentinel lymph node biopsy  Date:  June 07, 2011  Stage:T3N1a  Hormone receptor status: Positive  HPI:  She is here for another long-term visit of her left breast cancer.  She had chest wall radiation as well as chemotherapy in the past.   She is on Arimidex.    No left chest wall nodules.  No right breast masses.  No adenopathy.  Her right mammogram is due July 2014 PE:  Gen.-she is in no acute distress.  Left chest wall-well-healed scar with no nodules.  Right breast-no dominant masses or suspicious changes.  Lymph nodes-no palpable axillary, supraclavicular, or cervical adenopathy.   Assessment:  Left breast cancer-no clinical evidence of recurrence.   Plan:  Return visit 3 months.

## 2013-03-03 NOTE — Patient Instructions (Signed)
Call if you feel any new nodules in your right breast or on your left chest wall.

## 2013-03-17 DIAGNOSIS — G44209 Tension-type headache, unspecified, not intractable: Secondary | ICD-10-CM | POA: Diagnosis not present

## 2013-03-17 DIAGNOSIS — H2510 Age-related nuclear cataract, unspecified eye: Secondary | ICD-10-CM | POA: Diagnosis not present

## 2013-03-20 ENCOUNTER — Other Ambulatory Visit: Payer: Self-pay | Admitting: Oncology

## 2013-04-22 DIAGNOSIS — Z853 Personal history of malignant neoplasm of breast: Secondary | ICD-10-CM | POA: Diagnosis not present

## 2013-05-20 ENCOUNTER — Other Ambulatory Visit: Payer: Self-pay

## 2013-06-12 ENCOUNTER — Ambulatory Visit (INDEPENDENT_AMBULATORY_CARE_PROVIDER_SITE_OTHER): Payer: Medicare Other | Admitting: General Surgery

## 2013-06-12 ENCOUNTER — Encounter (INDEPENDENT_AMBULATORY_CARE_PROVIDER_SITE_OTHER): Payer: Self-pay | Admitting: General Surgery

## 2013-06-12 VITALS — BP 130/60 | HR 100 | Temp 98.0°F | Resp 15 | Ht 59.0 in | Wt 189.6 lb

## 2013-06-12 DIAGNOSIS — Z853 Personal history of malignant neoplasm of breast: Secondary | ICD-10-CM

## 2013-06-12 NOTE — Progress Notes (Signed)
Operation:  Left mastectomy and sentinel lymph node biopsy  Date:  June 07, 2011  Stage:T3N1a  Hormone receptor status: Positive  HPI:  She is here for another long-term visit of her left breast cancer.  She had chest wall radiation as well as chemotherapy in the past.   She is on Arimidex.    No left chest wall nodules.  No right breast masses.  No adenopathy.  Her right mammogram in July 2014 did not show any suspicious lesions. PE:  Gen.-she is in no acute distress.  Left chest wall-well-healed scar with no nodules.  Right breast-no dominant masses or suspicious changes.  Lymph nodes-no palpable axillary, supraclavicular, or cervical adenopathy.   Assessment:  Left breast cancer-no clinical evidence of recurrence. Right mammogram was okay.  Plan:  Return visit 6 months.

## 2013-06-12 NOTE — Patient Instructions (Signed)
Call for any new masses in your right breast or left chest wall.

## 2013-06-16 DIAGNOSIS — H16229 Keratoconjunctivitis sicca, not specified as Sjogren's, unspecified eye: Secondary | ICD-10-CM | POA: Diagnosis not present

## 2013-06-22 DIAGNOSIS — G4733 Obstructive sleep apnea (adult) (pediatric): Secondary | ICD-10-CM | POA: Diagnosis not present

## 2013-06-30 DIAGNOSIS — G4730 Sleep apnea, unspecified: Secondary | ICD-10-CM | POA: Diagnosis not present

## 2013-07-27 DIAGNOSIS — G4733 Obstructive sleep apnea (adult) (pediatric): Secondary | ICD-10-CM | POA: Diagnosis not present

## 2013-07-28 DIAGNOSIS — Z23 Encounter for immunization: Secondary | ICD-10-CM | POA: Diagnosis not present

## 2013-07-30 ENCOUNTER — Ambulatory Visit: Payer: Medicare Other | Admitting: Oncology

## 2013-07-30 ENCOUNTER — Encounter: Payer: Self-pay | Admitting: Oncology

## 2013-07-30 ENCOUNTER — Telehealth: Payer: Self-pay | Admitting: *Deleted

## 2013-07-30 ENCOUNTER — Ambulatory Visit (HOSPITAL_BASED_OUTPATIENT_CLINIC_OR_DEPARTMENT_OTHER): Payer: Medicare Other | Admitting: Oncology

## 2013-07-30 ENCOUNTER — Other Ambulatory Visit: Payer: Medicare Other | Admitting: Lab

## 2013-07-30 VITALS — BP 130/77 | HR 81 | Temp 98.4°F | Resp 18 | Ht 59.0 in | Wt 192.3 lb

## 2013-07-30 DIAGNOSIS — Z170 Estrogen receptor positive status [ER+]: Secondary | ICD-10-CM

## 2013-07-30 DIAGNOSIS — C50912 Malignant neoplasm of unspecified site of left female breast: Secondary | ICD-10-CM

## 2013-07-30 DIAGNOSIS — M8580 Other specified disorders of bone density and structure, unspecified site: Secondary | ICD-10-CM

## 2013-07-30 DIAGNOSIS — C50919 Malignant neoplasm of unspecified site of unspecified female breast: Secondary | ICD-10-CM | POA: Diagnosis not present

## 2013-07-30 NOTE — Progress Notes (Signed)
OFFICE PROGRESS NOTE  CCPearson Grippe, MD [address] [place] [address]  DIAGNOSIS: 67 year old female with history of T3 N1 ER/PR positive left breast cancer.  PRIOR THERAPY:  #1 patient underwent a left mastectomy with lymph node dissection for a T3 N1 ER/PR positive left breast cancer.  #2 she had Oncotype DX performed with a score of 28. Therefore she received 4 cycles of Taxotere and Cytoxan.  #3 patient completed chest wall radiation therapy and then subsequently was begun on Arimidex 1 mg daily in March 2013.  CURRENT THERAPY:Arimidex 1 mg daily  INTERVAL HISTORY: Christina Horn 67 y.o. female returns for followup visit today. Overall she's doing well. She's tolerating Arimidex without any problems. She denies any fevers chills night sweats headaches shortness of breath chest pains palpitations no myalgias and arthralgias.Remainder of the 10 point review of systems is negative.  MEDICAL HISTORY: Past Medical History  Diagnosis Date  . Hypertension   . Sleep apnea     Uses CPAP  . Breast cancer     left breast  . Anxiety     h/o anxiety attack  . Arthritis     back meds not required  . Diabetes mellitus     borderline  . Breast cancer, left breast 05/09/2011    ALLERGIES:  has No Known Allergies.  MEDICATIONS:  Current Outpatient Prescriptions  Medication Sig Dispense Refill  . Aclidinium Bromide (TUDORZA PRESSAIR) 400 MCG/ACT AEPB Inhale into the lungs.      Marland Kitchen alendronate (FOSAMAX) 35 MG tablet TAKE 1 TABLET (35 MG TOTAL) BY MOUTH EVERY 7 (SEVEN) DAYS. TAKE WITH AFULL GLASS OF WATER ON AN EMPTY STOMACH  12 tablet  3  . amLODipine (NORVASC) 5 MG tablet Take 5 mg by mouth daily.        Marland Kitchen anastrozole (ARIMIDEX) 1 MG tablet TAKE ONE TABLET BY MOUTH ONE TIME DAILY  30 tablet  10  . aspirin 81 MG tablet Take 81 mg by mouth daily.        . citalopram (CELEXA) 20 MG tablet Take 20 mg by mouth daily.        . fish oil-omega-3 fatty acids 1000  MG capsule Take 2 g by mouth daily.        . hydrochlorothiazide 25 MG tablet Take 25 mg by mouth daily.        Marland Kitchen losartan (COZAAR) 25 MG tablet       . Melatonin 1 MG CAPS Take by mouth 1 day or 1 dose.        . metFORMIN (GLUCOPHAGE) 500 MG tablet Take 500 mg by mouth 1 day or 1 dose. Take 1/2 once a day       . VITAMIN D, CHOLECALCIFEROL, PO Take by mouth.        . potassium chloride SA (K-DUR,KLOR-CON) 20 MEQ tablet Take 1 tablet (20 mEq total) by mouth daily.  30 tablet  2   No current facility-administered medications for this visit.    SURGICAL HISTORY:  Past Surgical History  Procedure Laterality Date  . Cholecystectomy    . Abdominal hysterectomy    . Left leg vein striping    . Tracheostomy    . Mastectomy  06/07/11    left   . Breast surgery      REVIEW OF SYSTEMS:  Pertinent items are noted in HPI.   HEALTH MAINTENANCE:    PHYSICAL EXAMINATION: Blood pressure 130/77, pulse 81, temperature 98.4 F (36.9  C), temperature source Oral, resp. rate 18, height 4\' 11"  (1.499 m), weight 192 lb 4.8 oz (87.227 kg). Body mass index is 38.82 kg/(m^2). ECOG PERFORMANCE STATUS: 0 - Asymptomatic   General appearance: alert, cooperative and appears stated age Lymph nodes: Cervical, supraclavicular, and axillary nodes normal. Resp: clear to auscultation bilaterally Back: symmetric, no curvature. ROM normal. No CVA tenderness. Cardio: regular rate and rhythm GI: soft, non-tender; bowel sounds normal; no masses,  no organomegaly Extremities: extremities normal, atraumatic, no cyanosis or edema Neurologic: Grossly normal   LABORATORY DATA: Lab Results  Component Value Date   WBC 8.8 07/30/2012   HGB 12.9 07/30/2012   HCT 38.8 07/30/2012   MCV 97.6 07/30/2012   PLT 260 07/30/2012      Chemistry      Component Value Date/Time   NA 140 07/30/2012 0939   NA 141 11/20/2011 1034   K 3.5 07/30/2012 0939   K 3.6 11/20/2011 1034   CL 100 07/30/2012 0939   CL 101 11/20/2011 1034    CO2 28 07/30/2012 0939   CO2 27 11/20/2011 1034   BUN 12.0 07/30/2012 0939   BUN 12 11/20/2011 1034   CREATININE 0.8 07/30/2012 0939   CREATININE 0.82 11/20/2011 1034      Component Value Date/Time   CALCIUM 9.7 07/30/2012 0939   CALCIUM 9.2 11/20/2011 1034   ALKPHOS 65 07/30/2012 0939   ALKPHOS 53 11/20/2011 1034   AST 14 07/30/2012 0939   AST 14 11/20/2011 1034   ALT 22 07/30/2012 0939   ALT 13 11/20/2011 1034   BILITOT 0.40 07/30/2012 0939   BILITOT 0.3 11/20/2011 1034       RADIOGRAPHIC STUDIES:  No results found.  ASSESSMENT: 67 year old female with  #1 history of stage II (T3 N1) left breast cancer that was ER positive. She is status post left modified radical mastectomy with nodal dissection. Followed by Oncotype testing with a score of 28. She received adjuvant chemotherapy consisting of 4 cycles of TC which she tolerated well. This was followed by chest wall radiation therapy which he completed on 12/14/2011. She was subsequently begun on room attacks 1 mg daily. Total of 5 years of therapy is planned. Overall she's tolerating it well without any problems.  #2 patient has no evidence of recurrent disease   PLAN:   #1 continue Arimidex 1 mg daily.  #2 she will obtain her mammogram and bone densities at the schedule times.  #3 I will see her back in 6 months time for followup   All questions were answered. The patient knows to call the clinic with any problems, questions or concerns. We can certainly see the patient much sooner if necessary.  I spent 15 minutes counseling the patient face to face. The total time spent in the appointment was 20 minutes.    Drue Second, MD Medical/Oncology [facility] [phone] (beeper) [phone] (Office)  07/30/2013, 10:37 AM

## 2013-07-30 NOTE — Patient Instructions (Signed)
You're doing well there is no clinical evidence of recurrent disease.  We will continue to see you every 6 months. Prior to your next visit we will obtain a bone density scan.  Please continue to take the arimidex and Fosamax as you are.  Please call with any problems questions or concerns

## 2013-07-30 NOTE — Telephone Encounter (Signed)
appts made and printed...td 

## 2013-08-03 ENCOUNTER — Telehealth: Payer: Self-pay | Admitting: *Deleted

## 2013-08-03 NOTE — Telephone Encounter (Signed)
Lm gv appts for 12/28/13 w/ labs @ 11:30am and ov@ 12p. Made pt aware that i will mail a letter/avs...td

## 2013-08-03 NOTE — Telephone Encounter (Signed)
Pt called to cancel her appt for 12/28/13 and to keep her appts for 01/08/14...td

## 2013-08-18 DIAGNOSIS — I10 Essential (primary) hypertension: Secondary | ICD-10-CM | POA: Diagnosis not present

## 2013-08-18 DIAGNOSIS — Z79899 Other long term (current) drug therapy: Secondary | ICD-10-CM | POA: Diagnosis not present

## 2013-08-18 DIAGNOSIS — E119 Type 2 diabetes mellitus without complications: Secondary | ICD-10-CM | POA: Diagnosis not present

## 2013-08-18 DIAGNOSIS — E785 Hyperlipidemia, unspecified: Secondary | ICD-10-CM | POA: Diagnosis not present

## 2013-08-19 DIAGNOSIS — Z79899 Other long term (current) drug therapy: Secondary | ICD-10-CM | POA: Diagnosis not present

## 2013-08-19 DIAGNOSIS — I10 Essential (primary) hypertension: Secondary | ICD-10-CM | POA: Diagnosis not present

## 2013-08-19 DIAGNOSIS — E119 Type 2 diabetes mellitus without complications: Secondary | ICD-10-CM | POA: Diagnosis not present

## 2013-08-20 ENCOUNTER — Other Ambulatory Visit: Payer: Self-pay

## 2013-08-25 DIAGNOSIS — E785 Hyperlipidemia, unspecified: Secondary | ICD-10-CM | POA: Diagnosis not present

## 2013-08-25 DIAGNOSIS — E119 Type 2 diabetes mellitus without complications: Secondary | ICD-10-CM | POA: Diagnosis not present

## 2013-08-25 DIAGNOSIS — I10 Essential (primary) hypertension: Secondary | ICD-10-CM | POA: Diagnosis not present

## 2013-08-25 DIAGNOSIS — R32 Unspecified urinary incontinence: Secondary | ICD-10-CM | POA: Diagnosis not present

## 2013-08-26 ENCOUNTER — Other Ambulatory Visit: Payer: Self-pay | Admitting: Oncology

## 2013-08-26 DIAGNOSIS — C50912 Malignant neoplasm of unspecified site of left female breast: Secondary | ICD-10-CM

## 2013-10-06 DIAGNOSIS — E119 Type 2 diabetes mellitus without complications: Secondary | ICD-10-CM | POA: Diagnosis not present

## 2013-10-06 DIAGNOSIS — R32 Unspecified urinary incontinence: Secondary | ICD-10-CM | POA: Diagnosis not present

## 2013-10-06 DIAGNOSIS — I10 Essential (primary) hypertension: Secondary | ICD-10-CM | POA: Diagnosis not present

## 2013-12-09 ENCOUNTER — Encounter (INDEPENDENT_AMBULATORY_CARE_PROVIDER_SITE_OTHER): Payer: Self-pay | Admitting: General Surgery

## 2013-12-09 ENCOUNTER — Ambulatory Visit (INDEPENDENT_AMBULATORY_CARE_PROVIDER_SITE_OTHER): Payer: Medicare Other | Admitting: General Surgery

## 2013-12-09 VITALS — BP 148/72 | HR 72 | Temp 98.5°F | Resp 16 | Ht 59.0 in | Wt 187.6 lb

## 2013-12-09 DIAGNOSIS — I10 Essential (primary) hypertension: Secondary | ICD-10-CM | POA: Diagnosis not present

## 2013-12-09 DIAGNOSIS — C50919 Malignant neoplasm of unspecified site of unspecified female breast: Secondary | ICD-10-CM | POA: Diagnosis not present

## 2013-12-09 DIAGNOSIS — E119 Type 2 diabetes mellitus without complications: Secondary | ICD-10-CM | POA: Diagnosis not present

## 2013-12-09 NOTE — Patient Instructions (Signed)
Call if you find any nodules in the left chest wall or new mass in the right breast.

## 2013-12-09 NOTE — Progress Notes (Signed)
Operation:  Left mastectomy and sentinel lymph node biopsy  Date:  June 07, 2011  Stage:T3N1a  Hormone receptor status: Positive  HPI:  She is here for a long-term visit for her left breast cancer.  She had chest wall radiation as well as chemotherapy in the past.   She remains on Arimidex.    No left chest wall nodules.  No right breast masses.  No adenopathy.  Her right mammogram is due in July 2015. PE:  Gen.-she is in no acute distress.  Left chest wall-well-healed scar with no nodules.  Right breast-no dominant masses or suspicious changes.  Lymph nodes-no palpable axillary, supraclavicular, or cervical adenopathy.   Assessment:  Left breast cancer-no clinical evidence of recurrence. Mammogram July of this year.  Plan:  Return visit 6 months.

## 2013-12-14 DIAGNOSIS — E785 Hyperlipidemia, unspecified: Secondary | ICD-10-CM | POA: Diagnosis not present

## 2013-12-14 DIAGNOSIS — M810 Age-related osteoporosis without current pathological fracture: Secondary | ICD-10-CM | POA: Diagnosis not present

## 2013-12-14 DIAGNOSIS — E119 Type 2 diabetes mellitus without complications: Secondary | ICD-10-CM | POA: Diagnosis not present

## 2013-12-14 DIAGNOSIS — I10 Essential (primary) hypertension: Secondary | ICD-10-CM | POA: Diagnosis not present

## 2013-12-21 ENCOUNTER — Other Ambulatory Visit: Payer: Medicare Other

## 2013-12-21 DIAGNOSIS — Z8262 Family history of osteoporosis: Secondary | ICD-10-CM | POA: Diagnosis not present

## 2013-12-21 DIAGNOSIS — Z780 Asymptomatic menopausal state: Secondary | ICD-10-CM | POA: Diagnosis not present

## 2013-12-28 ENCOUNTER — Ambulatory Visit: Payer: Medicare Other | Admitting: Oncology

## 2013-12-28 ENCOUNTER — Other Ambulatory Visit: Payer: Medicare Other | Admitting: Lab

## 2014-01-04 ENCOUNTER — Telehealth: Payer: Self-pay

## 2014-01-04 NOTE — Telephone Encounter (Signed)
Per Dr. Humphrey Rolls - pt to add calcium to her supplements, ensure she is getting 2000 units vitamin d, and increase fosamax to 70 mg per week.  Pt voiced understanding.  [facility] report sent to scan.

## 2014-01-08 ENCOUNTER — Ambulatory Visit (HOSPITAL_BASED_OUTPATIENT_CLINIC_OR_DEPARTMENT_OTHER): Payer: Medicare Other | Admitting: Oncology

## 2014-01-08 ENCOUNTER — Other Ambulatory Visit (HOSPITAL_BASED_OUTPATIENT_CLINIC_OR_DEPARTMENT_OTHER): Payer: Medicare Other

## 2014-01-08 ENCOUNTER — Other Ambulatory Visit: Payer: Medicare Other

## 2014-01-08 ENCOUNTER — Encounter: Payer: Self-pay | Admitting: Oncology

## 2014-01-08 ENCOUNTER — Ambulatory Visit: Payer: Medicare Other | Admitting: Oncology

## 2014-01-08 VITALS — BP 155/78 | HR 79 | Temp 98.0°F | Resp 18 | Ht 59.0 in | Wt 189.6 lb

## 2014-01-08 DIAGNOSIS — C50912 Malignant neoplasm of unspecified site of left female breast: Secondary | ICD-10-CM

## 2014-01-08 DIAGNOSIS — C50919 Malignant neoplasm of unspecified site of unspecified female breast: Secondary | ICD-10-CM | POA: Diagnosis not present

## 2014-01-08 LAB — COMPREHENSIVE METABOLIC PANEL (CC13)
ALT: 22 U/L (ref 0–55)
ANION GAP: 10 meq/L (ref 3–11)
AST: 16 U/L (ref 5–34)
Albumin: 3.5 g/dL (ref 3.5–5.0)
Alkaline Phosphatase: 60 U/L (ref 40–150)
BUN: 12 mg/dL (ref 7.0–26.0)
CALCIUM: 9.3 mg/dL (ref 8.4–10.4)
CHLORIDE: 107 meq/L (ref 98–109)
CO2: 25 meq/L (ref 22–29)
CREATININE: 0.8 mg/dL (ref 0.6–1.1)
Glucose: 117 mg/dl (ref 70–140)
POTASSIUM: 4.1 meq/L (ref 3.5–5.1)
SODIUM: 141 meq/L (ref 136–145)
TOTAL PROTEIN: 6.6 g/dL (ref 6.4–8.3)
Total Bilirubin: 0.32 mg/dL (ref 0.20–1.20)

## 2014-01-08 LAB — CBC WITH DIFFERENTIAL/PLATELET
BASO%: 0.9 % (ref 0.0–2.0)
Basophils Absolute: 0.1 10*3/uL (ref 0.0–0.1)
EOS%: 5 % (ref 0.0–7.0)
Eosinophils Absolute: 0.4 10*3/uL (ref 0.0–0.5)
HEMATOCRIT: 38.5 % (ref 34.8–46.6)
HGB: 12.6 g/dL (ref 11.6–15.9)
LYMPH#: 2.2 10*3/uL (ref 0.9–3.3)
LYMPH%: 25.3 % (ref 14.0–49.7)
MCH: 31.4 pg (ref 25.1–34.0)
MCHC: 32.6 g/dL (ref 31.5–36.0)
MCV: 96.3 fL (ref 79.5–101.0)
MONO#: 0.6 10*3/uL (ref 0.1–0.9)
MONO%: 6.7 % (ref 0.0–14.0)
NEUT#: 5.4 10*3/uL (ref 1.5–6.5)
NEUT%: 62.1 % (ref 38.4–76.8)
Platelets: 296 10*3/uL (ref 145–400)
RBC: 4 10*6/uL (ref 3.70–5.45)
RDW: 13.5 % (ref 11.2–14.5)
WBC: 8.6 10*3/uL (ref 3.9–10.3)

## 2014-01-08 MED ORDER — ANASTROZOLE 1 MG PO TABS: ORAL_TABLET | ORAL | Status: DC

## 2014-01-08 NOTE — Patient Instructions (Signed)
Breast Cancer Survivor Follow-Up Breast cancer begins when cells in the breast divide too rapidly. The extra cells form a lump (tumor). When the cancer is treated, the goal is to get rid of all cancer cells. However, sometimes a few cells survive. These cancer cells can then grow. They become recurrent cancer. This means the cancer comes back after treatment.  Most cases of recurrent breast cancer develop 3 to 5 years after treatment. However, sometimes it comes back just a few months after treatment. Other times, it does not come back until years later. If the cancer comes back in the same area as the first breast cancer, it is called a local recurrence. If the cancer comes back somewhere else in the body, it is called regional recurrence if the site is fairly near the breast or distant recurrence if it is far from the breast. Your caregiver may also use the term metastasize to indicate a cancer that has gone to another part of your body. Treatment is still possible after either kind of recurrence. The cancer can still be controlled.  CAUSES OF RECURRENT CANCER No one knows exactly why breast cancer starts in the first place. Why the cancer comes back after treatment is also not clear. It is known that certain conditions, called risk factors, can make this more likely. They include:  Developing breast cancer for the first time before age 60.  Having breast cancer that involves the lymph nodes. These are small, round pieces of tissue found all over the body. Their job is to help fight infections.  Having a large tumor. Cancer is more apt to come back if the first tumor was bigger than 2 inches (5 cm).  Having certain types of breast cancer, such as:  Inflammatory breast cancer. This rare type grows rapidly and causes the breast to become red and swollen.  A high-grade tumor. The grade of a tumor indicates how fast it will grow and spread. High-grade tumors grow more quickly than other types.  HER2  cancer. This refers to the tumor's genetic makeup. Tumors that have this type of gene are more likely to come back after treatment.  Having close tumor margins. This refers to the space between the tumor and normal, noncancerous cells. If the space is small, the tumor has a greater chance of coming back.  Having treatment involving a surgery to remove the tumor but not the entire breast (lumpectomy) and no radiation therapy. CARE AFTER BREAST CANCER Home Monitoring Women who have had breast cancer should continue to examine their breasts every month. The goal is to catch the cancer quickly if it comes back. Many women find it helpful to do so on the same day each month and to mark the calendar as a reminder. Let your caregiver know immediately if you have any signs of recurrent breast cancer. Symptoms will vary, depending on where the cancer recurs. The original type of treatment can also make a difference. Symptoms of local recurrence after a lumpectomy or a recurrence in the opposite breast may include:  A new lump or thickening in the breast.  A change in the way the skin looks on the breast (such as a rash, dimpling, or wrinkling).  Redness or swelling of the breast.  Changes in the nipple (such as being red, puckered, swollen, or leaking fluid). Symptoms of a recurrence after a breast removal surgery (mastectomy) may include:  A lump or thickening under the skin.  A thickening around the mastectomy scar. Symptoms   of regional recurrence in the lymph nodes near the breast may include:  A lump under the arm or above the collarbone.  Swelling of the arm.  Pain in the arm, shoulder, or chest.  Numbness in the hand or arm. Symptoms of distant recurrence may include:  A cough that does not go away.  Trouble breathing or shortness of breath.  Pain in the bones or the chest. This is pain that lasts or does not respond to rest and medicine.  Headaches.  Sudden vision  problems.  Dizziness.  Nausea or vomiting.  Losing weight without trying to.  Persistent abdominal pain.  Changes in bowel movements or blood in the stool.  Yellowing of the skin or eyes (jaundice).  Blood in the urine or bloody vaginal discharge. Clinical Monitoring  It is helpful to keep a schedule of appointments for needed tests and exams. This includes physical exams, breast exams, exams of the lymph nodes, and general exams.  For the first 3 years after being treated for breast cancer, see your caregiver every 3 to 6 months.  For years 4 and 5 after breast cancer, see your caregiver every 6 to 12 months.  After 5 years, see your caregiver at least once a year.  Regular breast X-rays (mammograms) should continue even if you had a mastectomy.  A mammogram should be done 1 year after the mammogram that first detected breast cancer.  A mammogram should be done every 6 to 12 months after that. Follow your caregiver's advice.  A pelvic exam done by your caregiver checks whether female organs are the normal size and shape. The exam is usually done every year. Ask your caregiver if that schedule is right for you.  Women taking tamoxifen should report any vaginal bleeding immediately to their caregiver. Tamoxifen is often given to women with a certain type of breast cancer. It has been shown to help prevent recurrence.  You will need to decide who your primary caregiver will be.  Most people continue to see their cancer specialist (oncologist) every 3 to 6 months for the first year after cancer treatment.  At some point, you may want to go back to seeing your family caregiver. You would no longer see your oncologist for regular checkups. Many women do this about 1 year after their first diagnosis of breast cancer.  You will still need to be seen every so often by your oncologist. Ask how often that should be. Coordinate this with your family or primary caregiver.  Think about  having genetic counseling. This would provide information on traits that can be passed or inherited from one generation to the next. In some cases, breast cancer runs in families. Tell your caregiver if you:  Are of Ashkenazi Jewish heritage.  Have any family member who has had ovarian cancer.  Have a mother, sister, or daughter who had breast cancer before age 7.  Have 2 or more close relatives who have had breast cancer. This means a mother, sister, daughter, aunt, or grandmother.  Had breast cancer in both breasts.  Have a female relative who has had breast cancer.  Some tests are not recommended for routine screening. Someone recovering from breast cancer does not need to have these tests if there are no problems. The tests have risks, such as radiation exposure, and can be costly. The risks of these tests are thought to be greater than the benefits:  Blood tests.  Chest X-rays.  Bone scans.  Liver ultrasound.  Computed tomography (CT scan).  Positron emission tomography (PET scan).  Magnetic resonance imaging (MRI scan). DIAGNOSIS OF RECURRENT CANCER Recurrent breast cancer may be suspected for various reasons. A mammogram may not look normal. You might feel a lump or have other symptoms. Your caregiver may find something unusual during an exam. To be sure, your caregiver will probably order some tests. The tests are needed because there are symptoms or hints of a problem. They could include:  Blood tests, including a test to check how well the liver is working. The liver is a common site for a distant cancer recurrence.  Imaging tests that create pictures of the inside of the body. These tests include:  Chest X-rays to show if the cancer has come back in the lungs.  CT scans to create detailed pictures of various areas of the body and help find a distant recurrence.  MRI scans to find anything unusual in the breast, chest, or lymph nodes.  Breast ultrasound tests to  examine the breasts.  Bone scans to create a picture of your whole skeleton and find cancer in bony areas.  PET scans to create an image of the whole body. PET scans can be used together with CT scans to show more detail.  Biopsy. A small sample of tissue is taken and checked under a microscope. If cancer cells are found, they may be tested to see if they contain the HER2 gene or the hormones estrogen and progesterone. This will help your caregiver decide how to treat the recurrent cancer. TREATMENT  How recurrent breast cancer is treated depends on where the new cancer is found. The type of treatment that was used for the first breast cancer makes a difference, too. A combination of treatments may be used. Options include:  Surgery.  If the cancer comes back in the breast that was not treated before, you may need a lumpectomy or mastectomy.  If the cancer comes back in the breast that was treated before, you may need a mastectomy.  The lymph nodes under the arm may need to be removed.  Radiation therapy.  For a local recurrence, radiation may be used if it was not used during the first treatment.  For a distance recurrence, radiation is sometimes used.  Chemotherapy.  This may be used before surgery to treat recurrent breast cancer.  This may be used to treat recurrent cancer that cannot be treated with surgery.  This may be used to treat a distant recurrence.  Hormone therapy.  Women with the HER2 gene may be given hormone therapy to attack this gene. Document Released: 05/30/2011 Document Revised: 12/24/2011 Document Reviewed: 05/30/2011 [employer] Patient Information 2014 [employer].  

## 2014-01-10 NOTE — Progress Notes (Signed)
[facility] OFFICE PROGRESS NOTE  Patient Care Team: [name], MD as PCP - General (Internal Medicine) [name], MD (Internal Medicine) Thea Silversmith, MD as Attending Physician (Radiation Oncology) Christene Slates as Attending Physician (Radiology)  DIAGNOSIS: 68 year old female with history of T3 N1 ER/PR positive left breast cancer   SUMMARY OF ONCOLOGIC HISTORY: #1 patient underwent a left mastectomy with lymph node dissection for a T3 N1 ER/PR positive left breast cancer.  #2 she had Oncotype DX performed with a score of 28. Therefore she received 4 cycles of Taxotere and Cytoxan.  #3 patient completed chest wall radiation therapy and then subsequently was begun on Arimidex 1 mg daily in March 2013.   CURRENT THERAPY:Arimidex 1 mg daily    INTERVAL HISTORY: Christina Horn 68 y.o. female returns for followup today. She seems to be doing well. She does have some aches and pains that does limit her activities. She is on Arimidex. But she tells me that she seems to be doing well with it except for some hot flashes. She thinks her aches and pains are coming from her arthritis. She denies having any nausea vomiting fevers chills or night sweats. She has no shortness of breath no chest pains. Remainder of the 10 point review of systems is negative and as below  I have reviewed the past medical history, past surgical history, social history and family history with the patient and they are unchanged from previous note.  ALLERGIES:  has No Known Allergies.  MEDICATIONS:  Current Outpatient Prescriptions  Medication Sig Dispense Refill  . Aclidinium Bromide (TUDORZA PRESSAIR) 400 MCG/ACT AEPB Inhale into the lungs.      Marland Kitchen alendronate (FOSAMAX) 35 MG tablet TAKE 1 TABLET (35 MG TOTAL) BY MOUTH EVERY 7 (SEVEN) DAYS. TAKE WITH AFULL GLASS OF WATER ON AN EMPTY STOMACH  12 tablet  3  . amLODipine (NORVASC) 5 MG tablet Take 5 mg by mouth daily.        Marland Kitchen anastrozole (ARIMIDEX) 1  MG tablet TAKE ONE TABLET BY MOUTH ONE TIME DAILY  30 tablet  12  . aspirin 81 MG tablet Take 81 mg by mouth daily.        . citalopram (CELEXA) 20 MG tablet Take 20 mg by mouth daily.        . fish oil-omega-3 fatty acids 1000 MG capsule Take 2 g by mouth daily.        . hydrochlorothiazide 25 MG tablet Take 25 mg by mouth daily.        Marland Kitchen losartan (COZAAR) 25 MG tablet       . Melatonin 1 MG CAPS Take by mouth 1 day or 1 dose.        . metFORMIN (GLUCOPHAGE) 500 MG tablet Take 500 mg by mouth 1 day or 1 dose. Take 1/2 once a day       . oxybutynin (DITROPAN) 5 MG tablet daily.      Marland Kitchen VITAMIN D, CHOLECALCIFEROL, PO Take by mouth.        . potassium chloride SA (K-DUR,KLOR-CON) 20 MEQ tablet Take 1 tablet (20 mEq total) by mouth daily.  30 tablet  2   No current facility-administered medications for this visit.    REVIEW OF SYSTEMS:   Constitutional: Denies fevers, chills or abnormal weight loss Eyes: Denies blurriness of vision Ears, nose, mouth, throat, and face: Denies mucositis or sore throat Respiratory: Denies cough, dyspnea or wheezes Cardiovascular: Denies palpitation, chest discomfort or lower  extremity swelling Gastrointestinal:  Denies nausea, heartburn or change in bowel habits Skin: Denies abnormal skin rashes Lymphatics: Denies new lymphadenopathy or easy bruising Neurological:Denies numbness, tingling or new weaknesses Behavioral/Psych: Mood is stable, no new changes  All other systems were reviewed with the patient and are negative.  PHYSICAL EXAMINATION: ECOG PERFORMANCE STATUS: 1 - Symptomatic but completely ambulatory  Filed Vitals:   01/08/14 1359  BP: 155/78  Pulse: 79  Temp: 98 F (36.7 C)  Resp: 18   Filed Weights   01/08/14 1359  Weight: 189 lb 9.6 oz (86.002 kg)    GENERAL:alert, no distress and comfortable SKIN: skin color, texture, turgor are normal, no rashes or significant lesions EYES: normal, Conjunctiva are pink and non-injected, sclera  clear OROPHARYNX:no exudate, no erythema and lips, buccal mucosa, and tongue normal  NECK: supple, thyroid normal size, non-tender, without nodularity LYMPH:  no palpable lymphadenopathy in the cervical, axillary or inguinal LUNGS: clear to auscultation and percussion with normal breathing effort HEART: regular rate & rhythm and no murmurs and no lower extremity edema ABDOMEN:abdomen soft, non-tender and normal bowel sounds Musculoskeletal:no cyanosis of digits and no clubbing  NEURO: alert & oriented x 3 with fluent speech, no focal motor/sensory deficits Left mastectomy site looks good no evidence of local recurrence. Right breast no masses or nipple discharge or skin changes.  LABORATORY DATA:  I have reviewed the data as listed    Component Value Date/Time   NA 141 01/08/2014 1340   NA 141 11/20/2011 1034   K 4.1 01/08/2014 1340   K 3.6 11/20/2011 1034   CL 100 07/30/2012 0939   CL 101 11/20/2011 1034   CO2 25 01/08/2014 1340   CO2 27 11/20/2011 1034   GLUCOSE 117 01/08/2014 1340   GLUCOSE 167* 07/30/2012 0939   GLUCOSE 191* 11/20/2011 1034   BUN 12.0 01/08/2014 1340   BUN 12 11/20/2011 1034   CREATININE 0.8 01/08/2014 1340   CREATININE 0.82 11/20/2011 1034   CALCIUM 9.3 01/08/2014 1340   CALCIUM 9.2 11/20/2011 1034   PROT 6.6 01/08/2014 1340   PROT 5.8* 11/20/2011 1034   ALBUMIN 3.5 01/08/2014 1340   ALBUMIN 3.9 11/20/2011 1034   AST 16 01/08/2014 1340   AST 14 11/20/2011 1034   ALT 22 01/08/2014 1340   ALT 13 11/20/2011 1034   ALKPHOS 60 01/08/2014 1340   ALKPHOS 53 11/20/2011 1034   BILITOT 0.32 01/08/2014 1340   BILITOT 0.3 11/20/2011 1034   GFRNONAA >60 05/28/2011 1243   GFRAA >60 05/28/2011 1243    No results found for this basename: SPEP, UPEP,  kappa and lambda light chains    Lab Results  Component Value Date   WBC 8.6 01/08/2014   NEUTROABS 5.4 01/08/2014   HGB 12.6 01/08/2014   HCT 38.5 01/08/2014   MCV 96.3 01/08/2014   PLT 296 01/08/2014      Chemistry      Component Value Date/Time    NA 141 01/08/2014 1340   NA 141 11/20/2011 1034   K 4.1 01/08/2014 1340   K 3.6 11/20/2011 1034   CL 100 07/30/2012 0939   CL 101 11/20/2011 1034   CO2 25 01/08/2014 1340   CO2 27 11/20/2011 1034   BUN 12.0 01/08/2014 1340   BUN 12 11/20/2011 1034   CREATININE 0.8 01/08/2014 1340   CREATININE 0.82 11/20/2011 1034      Component Value Date/Time   CALCIUM 9.3 01/08/2014 1340   CALCIUM 9.2 11/20/2011 1034   ALKPHOS  60 01/08/2014 1340   ALKPHOS 53 11/20/2011 1034   AST 16 01/08/2014 1340   AST 14 11/20/2011 1034   ALT 22 01/08/2014 1340   ALT 13 11/20/2011 1034   BILITOT 0.32 01/08/2014 1340   BILITOT 0.3 11/20/2011 1034       RADIOGRAPHIC STUDIES: I have personally reviewed the radiological images as listed and agreed with the findings in the report. No results found.    ASSESSMENT & PLAN:  68 year old female with  #1 stage II (T3 N1) left breast cancer that was ER positive. She underwent a left modified radical mastectomy with nodal dissection. Oncotype testing showed a score of 28. She received 4 cycles of adjuvant curative intent chemotherapy consisting of Taxotere and Cytoxan which she tolerated well. She received post mastectomy chest wall radiation therapy completed 12/14/2011. She was then begun on Arimidex 1 mg daily. Total of 5 years therapy is planned she is tolerating it well without any problems. She has no evidence of recurrent disease.  #2 mammogram bone density: Bone density showed low bone mass. She was increased on her Fosamax to 70 mg q. weekly. Her last mammogram was in July 2014 and this is coming. She is encouraged to keep this appointment.  #3 followup: Patient will be seen back in 6 months time or sooner if need arises.  Orders Placed This Encounter  Procedures  . CBC with Differential    Standing Status: Future     Number of Occurrences:      Standing Expiration Date: 01/08/2015  . Comprehensive metabolic panel (Cmet) - [facility]    Standing Status: Future     Number of  Occurrences:      Standing Expiration Date: 01/08/2015   All questions were answered. The patient knows to call the clinic with any problems, questions or concerns. No barriers to learning was detected. I spent 15 minutes counseling the patient face to face. The total time spent in the appointment was 25 minutes and more than 50% was on counseling and review of test results and coordination of care     Marcy Panning, MD 01/10/2014 12:48 AM

## 2014-01-12 ENCOUNTER — Telehealth: Payer: Self-pay | Admitting: Oncology

## 2014-01-12 NOTE — Telephone Encounter (Signed)
s.w. pt and advised on Jan 2016 appt....pt ok and aware °

## 2014-02-23 ENCOUNTER — Other Ambulatory Visit: Payer: Self-pay | Admitting: *Deleted

## 2014-02-23 DIAGNOSIS — C50912 Malignant neoplasm of unspecified site of left female breast: Secondary | ICD-10-CM

## 2014-02-23 MED ORDER — ALENDRONATE SODIUM 70 MG PO TABS: 70.0000 mg | ORAL_TABLET | ORAL | Status: DC

## 2014-03-03 ENCOUNTER — Encounter (INDEPENDENT_AMBULATORY_CARE_PROVIDER_SITE_OTHER): Payer: Self-pay | Admitting: General Surgery

## 2014-04-28 ENCOUNTER — Telehealth: Payer: Self-pay

## 2014-04-28 DIAGNOSIS — Z853 Personal history of malignant neoplasm of breast: Secondary | ICD-10-CM | POA: Diagnosis not present

## 2014-04-28 DIAGNOSIS — Z1231 Encounter for screening mammogram for malignant neoplasm of breast: Secondary | ICD-10-CM | POA: Diagnosis not present

## 2014-04-28 NOTE — Telephone Encounter (Signed)
Report received by fax from [facility] 04/28/14 - mammogram Dr. Isaiah Blakes.  Original to scan.  Copy to [facility]

## 2014-06-08 ENCOUNTER — Telehealth: Payer: Self-pay | Admitting: Hematology and Oncology

## 2014-06-08 NOTE — Telephone Encounter (Signed)
PT CALLED TODAY TO CHECK WHO WILL BE HER NEW ONC. CONFIRMED W/PT APPT FOR [facility] 10/28/14 @ 11:15AM. MOVED FROM [facility] TO [facility].

## 2014-06-09 ENCOUNTER — Ambulatory Visit (INDEPENDENT_AMBULATORY_CARE_PROVIDER_SITE_OTHER): Payer: Medicare Other | Admitting: General Surgery

## 2014-06-09 VITALS — BP 130/68 | HR 78 | Temp 98.9°F | Ht 59.0 in | Wt 182.0 lb

## 2014-06-09 DIAGNOSIS — C50912 Malignant neoplasm of unspecified site of left female breast: Secondary | ICD-10-CM

## 2014-06-09 DIAGNOSIS — C50919 Malignant neoplasm of unspecified site of unspecified female breast: Secondary | ICD-10-CM

## 2014-06-09 NOTE — Patient Instructions (Signed)
Call if you find any new lumps in your breasts or chest wall. 

## 2014-06-09 NOTE — Progress Notes (Signed)
Operation:  Left mastectomy and sentinel lymph node biopsy  Date:  June 07, 2011  Stage:T3N1a  Hormone receptor status: Positive  HPI:  She is here for a long-term visit for her left breast cancer.  She had chest wall radiation as well as chemotherapy in the past.   She remains on Arimidex.    No left chest wall nodules.  No right breast masses.  No adenopathy.  Her right mammogram was in July 2015 and demonstrated no suspicious areas. PE:  Gen.-she is in no acute distress.  Left chest wall-well-healed scar with no nodules.  Right breast-no dominant masses or suspicious changes.  Lymph nodes-no palpable axillary, supraclavicular, or cervical adenopathy.   Assessment:  Left breast cancer-no clinical evidence of recurrence.  She is due to see Dr. Lindi Adie in January 2016.  Plan:  Return visit 6 months after she sees Dr. Lindi Adie.

## 2014-06-16 DIAGNOSIS — E119 Type 2 diabetes mellitus without complications: Secondary | ICD-10-CM | POA: Diagnosis not present

## 2014-06-16 DIAGNOSIS — I10 Essential (primary) hypertension: Secondary | ICD-10-CM | POA: Diagnosis not present

## 2014-06-16 DIAGNOSIS — M810 Age-related osteoporosis without current pathological fracture: Secondary | ICD-10-CM | POA: Diagnosis not present

## 2014-06-22 DIAGNOSIS — I10 Essential (primary) hypertension: Secondary | ICD-10-CM | POA: Diagnosis not present

## 2014-06-22 DIAGNOSIS — E785 Hyperlipidemia, unspecified: Secondary | ICD-10-CM | POA: Diagnosis not present

## 2014-06-22 DIAGNOSIS — E119 Type 2 diabetes mellitus without complications: Secondary | ICD-10-CM | POA: Diagnosis not present

## 2014-07-06 DIAGNOSIS — R0902 Hypoxemia: Secondary | ICD-10-CM | POA: Diagnosis not present

## 2014-07-06 DIAGNOSIS — G4733 Obstructive sleep apnea (adult) (pediatric): Secondary | ICD-10-CM | POA: Diagnosis not present

## 2014-07-12 DIAGNOSIS — R0902 Hypoxemia: Secondary | ICD-10-CM | POA: Diagnosis not present

## 2014-07-13 DIAGNOSIS — R0902 Hypoxemia: Secondary | ICD-10-CM | POA: Diagnosis not present

## 2014-07-20 DIAGNOSIS — M810 Age-related osteoporosis without current pathological fracture: Secondary | ICD-10-CM | POA: Diagnosis not present

## 2014-07-20 DIAGNOSIS — E119 Type 2 diabetes mellitus without complications: Secondary | ICD-10-CM | POA: Diagnosis not present

## 2014-07-20 DIAGNOSIS — I10 Essential (primary) hypertension: Secondary | ICD-10-CM | POA: Diagnosis not present

## 2014-07-20 DIAGNOSIS — E780 Pure hypercholesterolemia: Secondary | ICD-10-CM | POA: Diagnosis not present

## 2014-07-23 DIAGNOSIS — Z23 Encounter for immunization: Secondary | ICD-10-CM | POA: Diagnosis not present

## 2014-08-11 DIAGNOSIS — J449 Chronic obstructive pulmonary disease, unspecified: Secondary | ICD-10-CM | POA: Diagnosis not present

## 2014-08-11 DIAGNOSIS — G4733 Obstructive sleep apnea (adult) (pediatric): Secondary | ICD-10-CM | POA: Diagnosis not present

## 2014-08-11 DIAGNOSIS — R0902 Hypoxemia: Secondary | ICD-10-CM | POA: Diagnosis not present

## 2014-08-11 DIAGNOSIS — Z9989 Dependence on other enabling machines and devices: Secondary | ICD-10-CM | POA: Diagnosis not present

## 2014-09-24 ENCOUNTER — Telehealth: Payer: Self-pay | Admitting: Hematology and Oncology

## 2014-09-24 NOTE — Telephone Encounter (Signed)
,,

## 2014-10-25 ENCOUNTER — Other Ambulatory Visit: Payer: Self-pay

## 2014-10-25 DIAGNOSIS — C50912 Malignant neoplasm of unspecified site of left female breast: Secondary | ICD-10-CM

## 2014-10-26 ENCOUNTER — Telehealth: Payer: Self-pay | Admitting: Hematology and Oncology

## 2014-10-26 ENCOUNTER — Ambulatory Visit (HOSPITAL_BASED_OUTPATIENT_CLINIC_OR_DEPARTMENT_OTHER): Payer: Medicare Other | Admitting: Hematology and Oncology

## 2014-10-26 ENCOUNTER — Other Ambulatory Visit (HOSPITAL_BASED_OUTPATIENT_CLINIC_OR_DEPARTMENT_OTHER): Payer: Medicare Other

## 2014-10-26 VITALS — BP 145/51 | HR 70 | Temp 98.2°F | Resp 18 | Ht 59.0 in | Wt 181.6 lb

## 2014-10-26 DIAGNOSIS — Z170 Estrogen receptor positive status [ER+]: Secondary | ICD-10-CM

## 2014-10-26 DIAGNOSIS — C50912 Malignant neoplasm of unspecified site of left female breast: Secondary | ICD-10-CM

## 2014-10-26 DIAGNOSIS — M8580 Other specified disorders of bone density and structure, unspecified site: Secondary | ICD-10-CM | POA: Diagnosis not present

## 2014-10-26 LAB — COMPREHENSIVE METABOLIC PANEL (CC13)
ALK PHOS: 50 U/L (ref 40–150)
ALT: 19 U/L (ref 0–55)
AST: 18 U/L (ref 5–34)
Albumin: 3.5 g/dL (ref 3.5–5.0)
Anion Gap: 10 mEq/L (ref 3–11)
BUN: 10.8 mg/dL (ref 7.0–26.0)
CALCIUM: 8.8 mg/dL (ref 8.4–10.4)
CHLORIDE: 105 meq/L (ref 98–109)
CO2: 27 mEq/L (ref 22–29)
Creatinine: 0.7 mg/dL (ref 0.6–1.1)
EGFR: 85 mL/min/{1.73_m2} — ABNORMAL LOW (ref >90)
Glucose: 87 mg/dl (ref 70–140)
POTASSIUM: 4 meq/L (ref 3.5–5.1)
SODIUM: 141 meq/L (ref 136–145)
Total Bilirubin: 0.29 mg/dL (ref 0.20–1.20)
Total Protein: 6.4 g/dL (ref 6.4–8.3)

## 2014-10-26 LAB — CBC WITH DIFFERENTIAL/PLATELET
BASO%: 0.8 % (ref 0.0–2.0)
Basophils Absolute: 0.1 10*3/uL (ref 0.0–0.1)
EOS%: 3.9 % (ref 0.0–7.0)
Eosinophils Absolute: 0.3 10*3/uL (ref 0.0–0.5)
HCT: 38.3 % (ref 34.8–46.6)
HGB: 12.1 g/dL (ref 11.6–15.9)
LYMPH#: 2.2 10*3/uL (ref 0.9–3.3)
LYMPH%: 27.2 % (ref 14.0–49.7)
MCH: 30.4 pg (ref 25.1–34.0)
MCHC: 31.7 g/dL (ref 31.5–36.0)
MCV: 95.8 fL (ref 79.5–101.0)
MONO#: 0.7 10*3/uL (ref 0.1–0.9)
MONO%: 8 % (ref 0.0–14.0)
NEUT#: 5 10*3/uL (ref 1.5–6.5)
NEUT%: 60.1 % (ref 38.4–76.8)
Platelets: 297 10*3/uL (ref 145–400)
RBC: 3.99 10*6/uL (ref 3.70–5.45)
RDW: 13 % (ref 11.2–14.5)
WBC: 8.3 10*3/uL (ref 3.9–10.3)

## 2014-10-26 NOTE — Progress Notes (Signed)
Patient Care Team: [name], MD as PCP - General (Internal Medicine) [name], MD (Internal Medicine) Thea Silversmith, MD as Attending Physician (Radiation Oncology) Christene Slates, MD as Attending Physician (Radiology)  DIAGNOSIS: No matching staging information was found for the patient.  SUMMARY OF ONCOLOGIC HISTORY:   Breast cancer, left breast   06/07/2011 Surgery  left mastectomy : multiple foci of invasive ductal carcinoma grade 2 5.1 cm and 1 cm with high-grade DCIS, lymphovascular invasion , 1/7 Oscar lymph node ER 100%, PR and her percent, Ki-67 50%, HER-2 equivocal ratio 2 , Oncotype 28   06/20/2011 - 09/11/2011 Chemotherapy TC X 4   10/08/2011 - 11/13/2011 Radiation Therapy chest wall radiation therapy    12/25/2011 -  Anti-estrogen oral therapy Arimidex 1 mg daily     CHIEF COMPLIANT: Follow-up of breast cancer  INTERVAL HISTORY: Christina Horn is a 69 year old lady with above-mentioned history of left-sided breast cancer treated with mastectomy followed by 4 cycles of adjuvant chemotherapy followed by general radiation and is now on Arimidex 1 mg daily since March 2013. She is tolerating Arimidex extremely well without any major problems or concerns. Denies any hot flashes muscular skeletal pains.  REVIEW OF SYSTEMS:   Constitutional: Denies fevers, chills or abnormal weight loss Eyes: Denies blurriness of vision Ears, nose, mouth, throat, and face: Denies mucositis or sore throat Respiratory: Denies cough, dyspnea or wheezes Cardiovascular: Denies palpitation, chest discomfort or lower extremity swelling Gastrointestinal:  Denies nausea, heartburn or change in bowel habits Skin: Denies abnormal skin rashes Lymphatics: Denies new lymphadenopathy or easy bruising Neurological:Denies numbness, tingling or new weaknesses Behavioral/Psych: Mood is stable, no new changes  Breast:  denies any pain or lumps or nodules in right breast All other systems were reviewed with the  patient and are negative.  I have reviewed the past medical history, past surgical history, social history and family history with the patient and they are unchanged from previous note.  ALLERGIES:  has No Known Allergies.  MEDICATIONS:  Current Outpatient Prescriptions  Medication Sig Dispense Refill  . Aclidinium Bromide (TUDORZA PRESSAIR) 400 MCG/ACT AEPB Inhale into the lungs.    Marland Kitchen alendronate (FOSAMAX) 70 MG tablet Take 1 tablet (70 mg total) by mouth once a week. Take with a full glass of water on an empty stomach. 12 tablet 3  . amLODipine (NORVASC) 5 MG tablet Take 5 mg by mouth daily.      Marland Kitchen anastrozole (ARIMIDEX) 1 MG tablet TAKE ONE TABLET BY MOUTH ONE TIME DAILY 30 tablet 12  . aspirin 81 MG tablet Take 81 mg by mouth daily.      . citalopram (CELEXA) 20 MG tablet Take 20 mg by mouth daily.      . fish oil-omega-3 fatty acids 1000 MG capsule Take 2 g by mouth daily.      . hydrochlorothiazide 25 MG tablet Take 25 mg by mouth daily.      Marland Kitchen losartan (COZAAR) 25 MG tablet     . Melatonin 1 MG CAPS Take by mouth 1 day or 1 dose.      . metFORMIN (GLUCOPHAGE) 500 MG tablet Take 500 mg by mouth 1 day or 1 dose. Take 1/2 once a day     . oxybutynin (DITROPAN) 5 MG tablet daily.    Marland Kitchen VITAMIN D, CHOLECALCIFEROL, PO Take by mouth.      . potassium chloride SA (K-DUR,KLOR-CON) 20 MEQ tablet Take 1 tablet (20 mEq total) by mouth daily. 30 tablet 2  No current facility-administered medications for this visit.    PHYSICAL EXAMINATION: ECOG PERFORMANCE STATUS: 1 - Symptomatic but completely ambulatory  There were no vitals filed for this visit. There were no vitals filed for this visit.  GENERAL:alert, no distress and comfortable SKIN: skin color, texture, turgor are normal, no rashes or significant lesions EYES: normal, Conjunctiva are pink and non-injected, sclera clear OROPHARYNX:no exudate, no erythema and lips, buccal mucosa, and tongue normal  NECK: supple, thyroid normal  size, non-tender, without nodularity LYMPH:  no palpable lymphadenopathy in the cervical, axillary or inguinal LUNGS: clear to auscultation and percussion with normal breathing effort HEART: regular rate & rhythm and no murmurs and no lower extremity edema ABDOMEN:abdomen soft, non-tender and normal bowel sounds Musculoskeletal:no cyanosis of digits and no clubbing  NEURO: alert & oriented x 3 with fluent speech, no focal motor/sensory deficits BREAST: No palpable masses or nodules in right breast. No palpable axillary supraclavicular or infraclavicular adenopathy no breast tenderness or nipple discharge.   LABORATORY DATA:  I have reviewed the data as listed   Chemistry      Component Value Date/Time   NA 141 01/08/2014 1340   NA 141 11/20/2011 1034   K 4.1 01/08/2014 1340   K 3.6 11/20/2011 1034   CL 100 07/30/2012 0939   CL 101 11/20/2011 1034   CO2 25 01/08/2014 1340   CO2 27 11/20/2011 1034   BUN 12.0 01/08/2014 1340   BUN 12 11/20/2011 1034   CREATININE 0.8 01/08/2014 1340   CREATININE 0.82 11/20/2011 1034      Component Value Date/Time   CALCIUM 9.3 01/08/2014 1340   CALCIUM 9.2 11/20/2011 1034   ALKPHOS 60 01/08/2014 1340   ALKPHOS 53 11/20/2011 1034   AST 16 01/08/2014 1340   AST 14 11/20/2011 1034   ALT 22 01/08/2014 1340   ALT 13 11/20/2011 1034   BILITOT 0.32 01/08/2014 1340   BILITOT 0.3 11/20/2011 1034       Lab Results  Component Value Date   WBC 8.3 10/26/2014   HGB 12.1 10/26/2014   HCT 38.3 10/26/2014   MCV 95.8 10/26/2014   PLT 297 10/26/2014   NEUTROABS 5.0 10/26/2014     RADIOGRAPHIC STUDIES: I have personally reviewed the radiology reports and agreed with their findings. Mammogram July 2015 was normal  ASSESSMENT & PLAN:  Breast cancer, left breast Left breast invasive ductal carcinoma T3 N1 M0 status post mastectomy, Oncotype DX score 28 status post 4 cycles of Taxotere and Cytoxan followed by chest wall radiation and Arimidex 1 mg  daily since March 2013  Arimidex toxicities: Breast cancer surveillance: 1. Physical examination on the left breast and right chest wall and 10/26/2014 is normal 2. Mammogram left breast 04/28/2014 is normal 3. Bone density March 2015 T score of -2.1 and the femur osteopenia Return to clinic in 1 year for follow-up   No orders of the defined types were placed in this encounter.   The patient has a good understanding of the overall plan. she agrees with it. She will call with any problems that may develop before her next visit here.   Rulon Eisenmenger, MD 10/26/2014 2:21 PM

## 2014-10-26 NOTE — Assessment & Plan Note (Signed)
Left breast invasive ductal carcinoma T3 N1 M0 status post mastectomy, Oncotype DX score 28 status post 4 cycles of Taxotere and Cytoxan followed by chest wall radiation and Arimidex 1 mg daily since March 2013  Arimidex toxicities: Breast cancer surveillance: 1. Physical examination on the left breast and right chest wall and 10/26/2014 is normal 2. Mammogram left breast 04/28/2014 is normal 3. Bone density March 2015 T score of -2.1 and the femur osteopenia Return to clinic in 1 year for follow-up

## 2014-10-28 ENCOUNTER — Ambulatory Visit: Payer: Medicare Other | Admitting: Hematology and Oncology

## 2014-10-28 ENCOUNTER — Other Ambulatory Visit: Payer: Medicare Other

## 2015-01-12 DIAGNOSIS — I10 Essential (primary) hypertension: Secondary | ICD-10-CM | POA: Diagnosis not present

## 2015-01-12 DIAGNOSIS — E119 Type 2 diabetes mellitus without complications: Secondary | ICD-10-CM | POA: Diagnosis not present

## 2015-01-12 DIAGNOSIS — E785 Hyperlipidemia, unspecified: Secondary | ICD-10-CM | POA: Diagnosis not present

## 2015-01-12 DIAGNOSIS — M810 Age-related osteoporosis without current pathological fracture: Secondary | ICD-10-CM | POA: Diagnosis not present

## 2015-01-19 DIAGNOSIS — J449 Chronic obstructive pulmonary disease, unspecified: Secondary | ICD-10-CM | POA: Diagnosis not present

## 2015-01-19 DIAGNOSIS — E780 Pure hypercholesterolemia: Secondary | ICD-10-CM | POA: Diagnosis not present

## 2015-01-19 DIAGNOSIS — I10 Essential (primary) hypertension: Secondary | ICD-10-CM | POA: Diagnosis not present

## 2015-01-19 DIAGNOSIS — E119 Type 2 diabetes mellitus without complications: Secondary | ICD-10-CM | POA: Diagnosis not present

## 2015-01-31 DIAGNOSIS — Z1212 Encounter for screening for malignant neoplasm of rectum: Secondary | ICD-10-CM | POA: Diagnosis not present

## 2015-02-01 ENCOUNTER — Other Ambulatory Visit: Payer: Self-pay | Admitting: Oncology

## 2015-02-01 NOTE — Telephone Encounter (Signed)
Last OV 11/01/2015.  Next OV 10/26/2014.  Chart reviewed

## 2015-04-20 DIAGNOSIS — I10 Essential (primary) hypertension: Secondary | ICD-10-CM | POA: Diagnosis not present

## 2015-04-20 DIAGNOSIS — E784 Other hyperlipidemia: Secondary | ICD-10-CM | POA: Diagnosis not present

## 2015-04-20 DIAGNOSIS — E119 Type 2 diabetes mellitus without complications: Secondary | ICD-10-CM | POA: Diagnosis not present

## 2015-04-20 DIAGNOSIS — J449 Chronic obstructive pulmonary disease, unspecified: Secondary | ICD-10-CM | POA: Diagnosis not present

## 2015-04-27 DIAGNOSIS — Z853 Personal history of malignant neoplasm of breast: Secondary | ICD-10-CM | POA: Diagnosis not present

## 2015-05-02 DIAGNOSIS — Z853 Personal history of malignant neoplasm of breast: Secondary | ICD-10-CM | POA: Diagnosis not present

## 2015-05-02 DIAGNOSIS — Z1231 Encounter for screening mammogram for malignant neoplasm of breast: Secondary | ICD-10-CM | POA: Diagnosis not present

## 2015-06-22 DIAGNOSIS — Z23 Encounter for immunization: Secondary | ICD-10-CM | POA: Diagnosis not present

## 2015-07-11 DIAGNOSIS — G4733 Obstructive sleep apnea (adult) (pediatric): Secondary | ICD-10-CM | POA: Diagnosis not present

## 2015-07-19 DIAGNOSIS — N390 Urinary tract infection, site not specified: Secondary | ICD-10-CM | POA: Diagnosis not present

## 2015-07-19 DIAGNOSIS — I10 Essential (primary) hypertension: Secondary | ICD-10-CM | POA: Diagnosis not present

## 2015-07-19 DIAGNOSIS — E119 Type 2 diabetes mellitus without complications: Secondary | ICD-10-CM | POA: Diagnosis not present

## 2015-07-25 DIAGNOSIS — E785 Hyperlipidemia, unspecified: Secondary | ICD-10-CM | POA: Diagnosis not present

## 2015-07-25 DIAGNOSIS — M810 Age-related osteoporosis without current pathological fracture: Secondary | ICD-10-CM | POA: Diagnosis not present

## 2015-07-25 DIAGNOSIS — E119 Type 2 diabetes mellitus without complications: Secondary | ICD-10-CM | POA: Diagnosis not present

## 2015-07-25 DIAGNOSIS — I10 Essential (primary) hypertension: Secondary | ICD-10-CM | POA: Diagnosis not present

## 2015-08-23 DIAGNOSIS — E119 Type 2 diabetes mellitus without complications: Secondary | ICD-10-CM | POA: Diagnosis not present

## 2015-08-23 DIAGNOSIS — E785 Hyperlipidemia, unspecified: Secondary | ICD-10-CM | POA: Diagnosis not present

## 2015-08-23 DIAGNOSIS — I10 Essential (primary) hypertension: Secondary | ICD-10-CM | POA: Diagnosis not present

## 2015-10-20 DIAGNOSIS — I10 Essential (primary) hypertension: Secondary | ICD-10-CM | POA: Diagnosis not present

## 2015-10-20 DIAGNOSIS — E119 Type 2 diabetes mellitus without complications: Secondary | ICD-10-CM | POA: Diagnosis not present

## 2015-10-26 NOTE — Assessment & Plan Note (Signed)
Left breast invasive ductal carcinoma T3 N1 M0 status post mastectomy, Oncotype DX score 28 status post 4 cycles of Taxotere and Cytoxan followed by chest wall radiation and Arimidex 1 mg daily since March 2013  Arimidex toxicities: Breast cancer surveillance: 1. Physical examination on the left breast and right chest wall and 10/26/2014 is normal 2. Mammogram left breast 04/28/2014 is normal 3. Bone density March 2015 T score of -2.1 and the femur osteopenia Return to clinic in 1 year for follow-up 

## 2015-10-27 ENCOUNTER — Ambulatory Visit (HOSPITAL_BASED_OUTPATIENT_CLINIC_OR_DEPARTMENT_OTHER): Payer: Medicare Other

## 2015-10-27 ENCOUNTER — Encounter: Payer: Self-pay | Admitting: Hematology and Oncology

## 2015-10-27 ENCOUNTER — Ambulatory Visit (HOSPITAL_BASED_OUTPATIENT_CLINIC_OR_DEPARTMENT_OTHER): Payer: Medicare Other | Admitting: Hematology and Oncology

## 2015-10-27 ENCOUNTER — Telehealth: Payer: Self-pay | Admitting: Hematology and Oncology

## 2015-10-27 VITALS — BP 149/54 | HR 80 | Temp 99.1°F | Resp 19 | Wt 179.0 lb

## 2015-10-27 DIAGNOSIS — Z170 Estrogen receptor positive status [ER+]: Secondary | ICD-10-CM

## 2015-10-27 DIAGNOSIS — C50512 Malignant neoplasm of lower-outer quadrant of left female breast: Secondary | ICD-10-CM | POA: Diagnosis not present

## 2015-10-27 DIAGNOSIS — Z79811 Long term (current) use of aromatase inhibitors: Secondary | ICD-10-CM | POA: Diagnosis not present

## 2015-10-27 DIAGNOSIS — M8580 Other specified disorders of bone density and structure, unspecified site: Secondary | ICD-10-CM

## 2015-10-27 LAB — CBC WITH DIFFERENTIAL/PLATELET
BASO%: 0.9 % (ref 0.0–2.0)
BASOS ABS: 0.1 10*3/uL (ref 0.0–0.1)
EOS ABS: 0.4 10*3/uL (ref 0.0–0.5)
EOS%: 5.8 % (ref 0.0–7.0)
HCT: 38.3 % (ref 34.8–46.6)
HGB: 12.5 g/dL (ref 11.6–15.9)
LYMPH#: 2.1 10*3/uL (ref 0.9–3.3)
LYMPH%: 28 % (ref 14.0–49.7)
MCH: 30.7 pg (ref 25.1–34.0)
MCHC: 32.7 g/dL (ref 31.5–36.0)
MCV: 94 fL (ref 79.5–101.0)
MONO#: 0.6 10*3/uL (ref 0.1–0.9)
MONO%: 8.5 % (ref 0.0–14.0)
NEUT%: 56.8 % (ref 38.4–76.8)
NEUTROS ABS: 4.2 10*3/uL (ref 1.5–6.5)
PLATELETS: 292 10*3/uL (ref 145–400)
RBC: 4.07 10*6/uL (ref 3.70–5.45)
RDW: 13.2 % (ref 11.2–14.5)
WBC: 7.5 10*3/uL (ref 3.9–10.3)

## 2015-10-27 LAB — COMPREHENSIVE METABOLIC PANEL
ALT: 21 U/L (ref 0–55)
ANION GAP: 8 meq/L (ref 3–11)
AST: 16 U/L (ref 5–34)
Albumin: 3.5 g/dL (ref 3.5–5.0)
Alkaline Phosphatase: 57 U/L (ref 40–150)
BUN: 14.1 mg/dL (ref 7.0–26.0)
CHLORIDE: 107 meq/L (ref 98–109)
CO2: 28 meq/L (ref 22–29)
Calcium: 9.3 mg/dL (ref 8.4–10.4)
Creatinine: 0.8 mg/dL (ref 0.6–1.1)
EGFR: 71 mL/min/{1.73_m2} — AB (ref >90)
GLUCOSE: 76 mg/dL (ref 70–140)
Potassium: 4.8 mEq/L (ref 3.5–5.1)
SODIUM: 144 meq/L (ref 136–145)
TOTAL PROTEIN: 6.6 g/dL (ref 6.4–8.3)
Total Bilirubin: 0.31 mg/dL (ref 0.20–1.20)

## 2015-10-27 NOTE — Telephone Encounter (Signed)
Gv pt appt for Jan 2018 and appt for bone density on 12/20/15 @ 9.30am..

## 2015-10-27 NOTE — Progress Notes (Signed)
Patient Care Team: [name], MD as PCP - General (Internal Medicine) [name], MD (Internal Medicine) Thea Silversmith, MD as Attending Physician (Radiation Oncology) Christene Slates, MD as Attending Physician (Radiology)  DIAGNOSIS: No matching staging information was found for the patient.  SUMMARY OF ONCOLOGIC HISTORY:   Breast cancer of lower-outer quadrant of left female breast ([place])   06/07/2011 Surgery  left mastectomy : multiple foci of invasive ductal carcinoma grade 2 5.1 cm and 1 cm with high-grade DCIS, lymphovascular invasion , 1/7 Oscar lymph node ER 100%, PR and her percent, Ki-67 50%, HER-2 equivocal ratio 2 , Oncotype 28   06/20/2011 - 09/11/2011 Chemotherapy TC X 4   10/08/2011 - 11/13/2011 Radiation Therapy chest wall radiation therapy    12/25/2011 -  Anti-estrogen oral therapy Arimidex 1 mg daily     CHIEF COMPLIANT: follow-up of left breast cancer  INTERVAL HISTORY: Christina Horn is a 70 year old with above-mentioned history of left breast cancer treated with mastectomy followed by adjuvant chemotherapy radiation and is currently on Arimidex therapy. She is tolerating it extremely well without any major problems or concerns. She denies any lumps or nodules in the breasts.  REVIEW OF SYSTEMS:   Constitutional: Denies fevers, chills or abnormal weight loss Eyes: Denies blurriness of vision Ears, nose, mouth, throat, and face: Denies mucositis or sore throat Respiratory: Denies cough, dyspnea or wheezes Cardiovascular: Denies palpitation, chest discomfort Gastrointestinal:  Denies nausea, heartburn or change in bowel habits Skin: Denies abnormal skin rashes Lymphatics: Denies new lymphadenopathy or easy bruising Neurological:Denies numbness, tingling or new weaknesses Behavioral/Psych: Mood is stable, no new changes  Extremities: No lower extremity edema Breast:  denies any pain or lumps or nodules in either breasts All other systems were reviewed with the patient  and are negative.  I have reviewed the past medical history, past surgical history, social history and family history with the patient and they are unchanged from previous note.  ALLERGIES:  has No Known Allergies.  MEDICATIONS:  Current Outpatient Prescriptions  Medication Sig Dispense Refill  . Aclidinium Bromide (TUDORZA PRESSAIR) 400 MCG/ACT AEPB Inhale into the lungs.    Marland Kitchen alendronate (FOSAMAX) 70 MG tablet Take 1 tablet (70 mg total) by mouth once a week. Take with a full glass of water on an empty stomach. 12 tablet 3  . amLODipine (NORVASC) 5 MG tablet Take 5 mg by mouth daily.      Marland Kitchen anastrozole (ARIMIDEX) 1 MG tablet TAKE ONE TABLET BY MOUTH ONE TIME DAILY 30 tablet 11  . aspirin 81 MG tablet Take 81 mg by mouth daily.      . citalopram (CELEXA) 20 MG tablet Take 20 mg by mouth daily.      . fish oil-omega-3 fatty acids 1000 MG capsule Take 2 g by mouth daily.      . hydrochlorothiazide 25 MG tablet Take 25 mg by mouth daily.      Marland Kitchen losartan (COZAAR) 25 MG tablet     . Melatonin 1 MG CAPS Take by mouth 1 day or 1 dose.      . metFORMIN (GLUCOPHAGE) 500 MG tablet Take 500 mg by mouth 1 day or 1 dose. Take 1/2 once a day     . oxybutynin (DITROPAN) 5 MG tablet daily.    . potassium chloride SA (K-DUR,KLOR-CON) 20 MEQ tablet Take 1 tablet (20 mEq total) by mouth daily. 30 tablet 2  . VITAMIN D, CHOLECALCIFEROL, PO Take by mouth.  No current facility-administered medications for this visit.    PHYSICAL EXAMINATION: ECOG PERFORMANCE STATUS: 0 - Asymptomatic  Filed Vitals:   10/27/15 1308  BP: 149/54  Pulse: 80  Temp: 99.1 F (37.3 C)  Resp: 19   Filed Weights   10/27/15 1308  Weight: 179 lb (81.194 kg)    GENERAL:alert, no distress and comfortable SKIN: skin color, texture, turgor are normal, no rashes or significant lesions EYES: normal, Conjunctiva are pink and non-injected, sclera clear OROPHARYNX:no exudate, no erythema and lips, buccal mucosa, and tongue  normal  NECK: supple, thyroid normal size, non-tender, without nodularity LYMPH:  no palpable lymphadenopathy in the cervical, axillary or inguinal LUNGS: clear to auscultation and percussion with normal breathing effort HEART: regular rate & rhythm and no murmurs and no lower extremity edema ABDOMEN:abdomen soft, non-tender and normal bowel sounds MUSCULOSKELETAL:no cyanosis of digits and no clubbing  NEURO: alert & oriented x 3 with fluent speech, no focal motor/sensory deficits EXTREMITIES: No lower extremity edema BREAST: No palpable masses or nodules in either right or left breasts. No palpable axillary supraclavicular or infraclavicular adenopathy no breast tenderness or nipple discharge. (exam performed in the presence of a chaperone)  LABORATORY DATA:  I have reviewed the data as listed   Chemistry      Component Value Date/Time   NA 144 10/27/2015 1301   NA 141 11/20/2011 1034   K 4.8 10/27/2015 1301   K 3.6 11/20/2011 1034   CL 100 07/30/2012 0939   CL 101 11/20/2011 1034   CO2 28 10/27/2015 1301   CO2 27 11/20/2011 1034   BUN 14.1 10/27/2015 1301   BUN 12 11/20/2011 1034   CREATININE 0.8 10/27/2015 1301   CREATININE 0.82 11/20/2011 1034      Component Value Date/Time   CALCIUM 9.3 10/27/2015 1301   CALCIUM 9.2 11/20/2011 1034   ALKPHOS 57 10/27/2015 1301   ALKPHOS 53 11/20/2011 1034   AST 16 10/27/2015 1301   AST 14 11/20/2011 1034   ALT 21 10/27/2015 1301   ALT 13 11/20/2011 1034   BILITOT 0.31 10/27/2015 1301   BILITOT 0.3 11/20/2011 1034       Lab Results  Component Value Date   WBC 7.5 10/27/2015   HGB 12.5 10/27/2015   HCT 38.3 10/27/2015   MCV 94.0 10/27/2015   PLT 292 10/27/2015   NEUTROABS 4.2 10/27/2015     ASSESSMENT & PLAN:  Breast cancer of lower-outer quadrant of left female breast ([place]) Left breast invasive ductal carcinoma T3 N1 M0 status post mastectomy, Oncotype DX score 28 status post 4 cycles of Taxotere and Cytoxan followed by  chest wall radiation and Arimidex 1 mg daily since March 2013  Arimidex toxicities: Denies any hot flashes or myalgias. I discussed the issue of duration of therapy. I discussed the results of a 17 clinical trial supporting the extended adjuvant therapy for high-risk patients, patient wishes to stay on antiestrogen therapy for protein duration of 10 years..  Breast cancer surveillance: 1. Physical examination on the left breast and right chest wall and 10/26/2014 is normal 2. Mammogram left breast 04/29/2015 is normal 3. Bone density March 2015 T score of -2.1 and the femur osteopenia. I ordered another bone density test to be done in March 2017 to be done at [facility].  Osteopenia: Currently on Fosamax and calcium and vitamin D  Return to clinic in 1 year for follow-up   No orders of the defined types were placed in this encounter.  The patient has a good understanding of the overall plan. she agrees with it. she will call with any problems that may develop before the next visit here.   Rulon Eisenmenger, MD 10/27/2015

## 2015-10-27 NOTE — Addendum Note (Signed)
Addended by: Prentiss Bells on: 10/27/2015 03:01 PM   Modules accepted: Medications

## 2015-12-02 DIAGNOSIS — E119 Type 2 diabetes mellitus without complications: Secondary | ICD-10-CM | POA: Diagnosis not present

## 2015-12-02 DIAGNOSIS — I10 Essential (primary) hypertension: Secondary | ICD-10-CM | POA: Diagnosis not present

## 2015-12-02 DIAGNOSIS — E785 Hyperlipidemia, unspecified: Secondary | ICD-10-CM | POA: Diagnosis not present

## 2015-12-20 DIAGNOSIS — M85851 Other specified disorders of bone density and structure, right thigh: Secondary | ICD-10-CM | POA: Diagnosis not present

## 2016-01-23 DIAGNOSIS — R05 Cough: Secondary | ICD-10-CM | POA: Diagnosis not present

## 2016-02-23 DIAGNOSIS — E119 Type 2 diabetes mellitus without complications: Secondary | ICD-10-CM | POA: Diagnosis not present

## 2016-02-23 DIAGNOSIS — I10 Essential (primary) hypertension: Secondary | ICD-10-CM | POA: Diagnosis not present

## 2016-02-26 ENCOUNTER — Other Ambulatory Visit: Payer: Self-pay | Admitting: Hematology and Oncology

## 2016-02-29 DIAGNOSIS — I10 Essential (primary) hypertension: Secondary | ICD-10-CM | POA: Diagnosis not present

## 2016-02-29 DIAGNOSIS — E785 Hyperlipidemia, unspecified: Secondary | ICD-10-CM | POA: Diagnosis not present

## 2016-02-29 DIAGNOSIS — J449 Chronic obstructive pulmonary disease, unspecified: Secondary | ICD-10-CM | POA: Diagnosis not present

## 2016-02-29 DIAGNOSIS — E119 Type 2 diabetes mellitus without complications: Secondary | ICD-10-CM | POA: Diagnosis not present

## 2016-03-06 ENCOUNTER — Encounter: Payer: Self-pay | Admitting: *Deleted

## 2016-03-06 NOTE — Progress Notes (Signed)
Received labs from [facility], reviewed by Dr. Lindi Adie, sent to scan.

## 2016-03-28 DIAGNOSIS — Z1212 Encounter for screening for malignant neoplasm of rectum: Secondary | ICD-10-CM | POA: Diagnosis not present

## 2016-04-10 ENCOUNTER — Encounter: Payer: Self-pay | Admitting: Hematology and Oncology

## 2016-04-19 DIAGNOSIS — Z853 Personal history of malignant neoplasm of breast: Secondary | ICD-10-CM | POA: Diagnosis not present

## 2016-04-25 ENCOUNTER — Other Ambulatory Visit: Payer: Self-pay | Admitting: *Deleted

## 2016-04-25 DIAGNOSIS — C50512 Malignant neoplasm of lower-outer quadrant of left female breast: Secondary | ICD-10-CM

## 2016-04-25 MED ORDER — ANASTROZOLE 1 MG PO TABS: 1.0000 mg | ORAL_TABLET | Freq: Every day | ORAL | Status: DC

## 2016-05-02 DIAGNOSIS — Z1231 Encounter for screening mammogram for malignant neoplasm of breast: Secondary | ICD-10-CM | POA: Diagnosis not present

## 2016-05-02 DIAGNOSIS — Z853 Personal history of malignant neoplasm of breast: Secondary | ICD-10-CM | POA: Diagnosis not present

## 2016-05-03 DIAGNOSIS — E119 Type 2 diabetes mellitus without complications: Secondary | ICD-10-CM | POA: Diagnosis not present

## 2016-05-03 DIAGNOSIS — H40033 Anatomical narrow angle, bilateral: Secondary | ICD-10-CM | POA: Diagnosis not present

## 2016-05-10 DIAGNOSIS — R05 Cough: Secondary | ICD-10-CM | POA: Diagnosis not present

## 2016-05-10 DIAGNOSIS — E119 Type 2 diabetes mellitus without complications: Secondary | ICD-10-CM | POA: Diagnosis not present

## 2016-05-10 DIAGNOSIS — R0609 Other forms of dyspnea: Secondary | ICD-10-CM | POA: Diagnosis not present

## 2016-05-10 DIAGNOSIS — I10 Essential (primary) hypertension: Secondary | ICD-10-CM | POA: Diagnosis not present

## 2016-05-30 DIAGNOSIS — R9431 Abnormal electrocardiogram [ECG] [EKG]: Secondary | ICD-10-CM | POA: Diagnosis not present

## 2016-05-30 DIAGNOSIS — R0609 Other forms of dyspnea: Secondary | ICD-10-CM | POA: Diagnosis not present

## 2016-05-30 DIAGNOSIS — I10 Essential (primary) hypertension: Secondary | ICD-10-CM | POA: Diagnosis not present

## 2016-05-30 DIAGNOSIS — E1165 Type 2 diabetes mellitus with hyperglycemia: Secondary | ICD-10-CM | POA: Diagnosis not present

## 2016-06-07 ENCOUNTER — Other Ambulatory Visit: Payer: Self-pay | Admitting: Hematology and Oncology

## 2016-06-07 DIAGNOSIS — C50512 Malignant neoplasm of lower-outer quadrant of left female breast: Secondary | ICD-10-CM

## 2016-06-08 DIAGNOSIS — I4510 Unspecified right bundle-branch block: Secondary | ICD-10-CM | POA: Diagnosis not present

## 2016-06-08 DIAGNOSIS — R0602 Shortness of breath: Secondary | ICD-10-CM | POA: Diagnosis not present

## 2016-06-08 DIAGNOSIS — I10 Essential (primary) hypertension: Secondary | ICD-10-CM | POA: Diagnosis not present

## 2016-06-08 DIAGNOSIS — E119 Type 2 diabetes mellitus without complications: Secondary | ICD-10-CM | POA: Diagnosis not present

## 2016-06-08 NOTE — Telephone Encounter (Signed)
Chart reviewed.

## 2016-06-14 DIAGNOSIS — R0602 Shortness of breath: Secondary | ICD-10-CM | POA: Diagnosis not present

## 2016-06-14 DIAGNOSIS — I10 Essential (primary) hypertension: Secondary | ICD-10-CM | POA: Diagnosis not present

## 2016-06-14 DIAGNOSIS — I4510 Unspecified right bundle-branch block: Secondary | ICD-10-CM | POA: Diagnosis not present

## 2016-06-25 DIAGNOSIS — I10 Essential (primary) hypertension: Secondary | ICD-10-CM | POA: Diagnosis not present

## 2016-06-25 DIAGNOSIS — R9431 Abnormal electrocardiogram [ECG] [EKG]: Secondary | ICD-10-CM | POA: Diagnosis not present

## 2016-06-25 DIAGNOSIS — R0609 Other forms of dyspnea: Secondary | ICD-10-CM | POA: Diagnosis not present

## 2016-06-25 DIAGNOSIS — E1165 Type 2 diabetes mellitus with hyperglycemia: Secondary | ICD-10-CM | POA: Diagnosis not present

## 2016-06-28 DIAGNOSIS — G4733 Obstructive sleep apnea (adult) (pediatric): Secondary | ICD-10-CM | POA: Diagnosis not present

## 2016-06-28 DIAGNOSIS — Z23 Encounter for immunization: Secondary | ICD-10-CM | POA: Diagnosis not present

## 2016-08-17 ENCOUNTER — Telehealth: Payer: Self-pay | Admitting: *Deleted

## 2016-08-17 NOTE — Telephone Encounter (Signed)
Pt called to check her appts.  Confirmed appts in January.

## 2016-08-18 ENCOUNTER — Encounter (HOSPITAL_COMMUNITY): Payer: Self-pay

## 2016-08-18 ENCOUNTER — Observation Stay (HOSPITAL_COMMUNITY)
Admission: EM | Admit: 2016-08-18 | Discharge: 2016-08-19 | Disposition: A | Discharge status: Home / Self Care | Payer: Medicare Other | Attending: Internal Medicine | Admitting: Internal Medicine

## 2016-08-18 ENCOUNTER — Emergency Department (HOSPITAL_COMMUNITY): Payer: Medicare Other

## 2016-08-18 DIAGNOSIS — Z7984 Long term (current) use of oral hypoglycemic drugs: Secondary | ICD-10-CM | POA: Diagnosis not present

## 2016-08-18 DIAGNOSIS — Z853 Personal history of malignant neoplasm of breast: Secondary | ICD-10-CM | POA: Insufficient documentation

## 2016-08-18 DIAGNOSIS — F418 Other specified anxiety disorders: Secondary | ICD-10-CM | POA: Diagnosis present

## 2016-08-18 DIAGNOSIS — R000 Tachycardia, unspecified: Secondary | ICD-10-CM | POA: Diagnosis not present

## 2016-08-18 DIAGNOSIS — R778 Other specified abnormalities of plasma proteins: Secondary | ICD-10-CM | POA: Diagnosis present

## 2016-08-18 DIAGNOSIS — Z79899 Other long term (current) drug therapy: Secondary | ICD-10-CM | POA: Diagnosis not present

## 2016-08-18 DIAGNOSIS — E119 Type 2 diabetes mellitus without complications: Secondary | ICD-10-CM | POA: Insufficient documentation

## 2016-08-18 DIAGNOSIS — R748 Abnormal levels of other serum enzymes: Secondary | ICD-10-CM

## 2016-08-18 DIAGNOSIS — R0600 Dyspnea, unspecified: Secondary | ICD-10-CM | POA: Diagnosis present

## 2016-08-18 DIAGNOSIS — Z87891 Personal history of nicotine dependence: Secondary | ICD-10-CM | POA: Diagnosis not present

## 2016-08-18 DIAGNOSIS — J449 Chronic obstructive pulmonary disease, unspecified: Secondary | ICD-10-CM | POA: Diagnosis not present

## 2016-08-18 DIAGNOSIS — N289 Disorder of kidney and ureter, unspecified: Secondary | ICD-10-CM

## 2016-08-18 DIAGNOSIS — R0602 Shortness of breath: Principal | ICD-10-CM | POA: Insufficient documentation

## 2016-08-18 DIAGNOSIS — G4733 Obstructive sleep apnea (adult) (pediatric): Secondary | ICD-10-CM

## 2016-08-18 DIAGNOSIS — I10 Essential (primary) hypertension: Secondary | ICD-10-CM | POA: Diagnosis not present

## 2016-08-18 DIAGNOSIS — Z7982 Long term (current) use of aspirin: Secondary | ICD-10-CM | POA: Diagnosis not present

## 2016-08-18 DIAGNOSIS — R0609 Other forms of dyspnea: Secondary | ICD-10-CM

## 2016-08-18 DIAGNOSIS — R061 Stridor: Secondary | ICD-10-CM | POA: Diagnosis not present

## 2016-08-18 DIAGNOSIS — R7989 Other specified abnormal findings of blood chemistry: Secondary | ICD-10-CM

## 2016-08-18 DIAGNOSIS — Z9989 Dependence on other enabling machines and devices: Secondary | ICD-10-CM

## 2016-08-18 HISTORY — DX: Pneumonia, unspecified organism: J18.9

## 2016-08-18 HISTORY — DX: Chronic obstructive pulmonary disease, unspecified: J44.9

## 2016-08-18 LAB — CBC WITH DIFFERENTIAL/PLATELET
BASOS PCT: 0 %
Basophils Absolute: 0 10*3/uL (ref 0.0–0.1)
EOS ABS: 0.1 10*3/uL (ref 0.0–0.7)
Eosinophils Relative: 1 %
HCT: 37.9 % (ref 36.0–46.0)
Hemoglobin: 12.5 g/dL (ref 12.0–15.0)
Lymphocytes Relative: 11 %
Lymphs Abs: 1.1 10*3/uL (ref 0.7–4.0)
MCH: 31.6 pg (ref 26.0–34.0)
MCHC: 33 g/dL (ref 30.0–36.0)
MCV: 95.7 fL (ref 78.0–100.0)
MONO ABS: 0.6 10*3/uL (ref 0.1–1.0)
MONOS PCT: 6 %
Neutro Abs: 8.2 10*3/uL — ABNORMAL HIGH (ref 1.7–7.7)
Neutrophils Relative %: 82 %
PLATELETS: 286 10*3/uL (ref 150–400)
RBC: 3.96 MIL/uL (ref 3.87–5.11)
RDW: 13 % (ref 11.5–15.5)
WBC: 10 10*3/uL (ref 4.0–10.5)

## 2016-08-18 LAB — BRAIN NATRIURETIC PEPTIDE: B NATRIURETIC PEPTIDE 5: 88 pg/mL (ref 0.0–100.0)

## 2016-08-18 LAB — COMPREHENSIVE METABOLIC PANEL
ALBUMIN: 3.8 g/dL (ref 3.5–5.0)
ALT: 26 U/L (ref 14–54)
ANION GAP: 8 (ref 5–15)
AST: 25 U/L (ref 15–41)
Alkaline Phosphatase: 45 U/L (ref 38–126)
BUN: 15 mg/dL (ref 6–20)
CHLORIDE: 106 mmol/L (ref 101–111)
CO2: 24 mmol/L (ref 22–32)
Calcium: 9 mg/dL (ref 8.9–10.3)
Creatinine, Ser: 1.1 mg/dL — ABNORMAL HIGH (ref 0.44–1.00)
GFR calc Af Amer: 57 mL/min — ABNORMAL LOW (ref >60)
GFR calc non Af Amer: 49 mL/min — ABNORMAL LOW (ref >60)
GLUCOSE: 111 mg/dL — AB (ref 65–99)
POTASSIUM: 3.9 mmol/L (ref 3.5–5.1)
SODIUM: 138 mmol/L (ref 135–145)
TOTAL PROTEIN: 7 g/dL (ref 6.5–8.1)
Total Bilirubin: 0.4 mg/dL (ref 0.3–1.2)

## 2016-08-18 LAB — TSH: TSH: 1.029 u[IU]/mL (ref 0.350–4.500)

## 2016-08-18 LAB — TROPONIN I: Troponin I: 0.05 ng/mL (ref <0.03)

## 2016-08-18 LAB — D-DIMER, QUANTITATIVE: D-Dimer, Quant: 0.42 ug/mL-FEU (ref 0.00–0.50)

## 2016-08-18 LAB — GLUCOSE, CAPILLARY: Glucose-Capillary: 113 mg/dL — ABNORMAL HIGH (ref 65–99)

## 2016-08-18 MED ORDER — POTASSIUM CHLORIDE CRYS ER 20 MEQ PO TBCR
20.0000 meq | EXTENDED_RELEASE_TABLET | Freq: Every day | ORAL | Status: DC
  Administered 2016-08-19: 20 meq via ORAL
  Filled 2016-08-18: qty 1

## 2016-08-18 MED ORDER — OMEGA-3 FATTY ACIDS 1000 MG PO CAPS: 2.0000 g | ORAL_CAPSULE | Freq: Every day | ORAL | Status: DC

## 2016-08-18 MED ORDER — INSULIN ASPART 100 UNIT/ML ~~LOC~~ SOLN: 0.0000 [IU] | Freq: Three times a day (TID) | SUBCUTANEOUS | Status: DC

## 2016-08-18 MED ORDER — ASPIRIN EC 81 MG PO TBEC
81.0000 mg | DELAYED_RELEASE_TABLET | Freq: Every day | ORAL | Status: DC
  Administered 2016-08-19: 81 mg via ORAL
  Filled 2016-08-18: qty 1

## 2016-08-18 MED ORDER — ACETAMINOPHEN 325 MG PO TABS: 650.0000 mg | ORAL_TABLET | ORAL | Status: DC | PRN

## 2016-08-18 MED ORDER — ONDANSETRON HCL 4 MG/2ML IJ SOLN: 4.0000 mg | Freq: Four times a day (QID) | INTRAMUSCULAR | Status: DC | PRN

## 2016-08-18 MED ORDER — IPRATROPIUM-ALBUTEROL 0.5-2.5 (3) MG/3ML IN SOLN: 3.0000 mL | RESPIRATORY_TRACT | Status: DC | PRN

## 2016-08-18 MED ORDER — LABETALOL HCL 5 MG/ML IV SOLN: 5.0000 mg | INTRAVENOUS | Status: DC | PRN

## 2016-08-18 MED ORDER — CITALOPRAM HYDROBROMIDE 20 MG PO TABS
20.0000 mg | ORAL_TABLET | Freq: Every day | ORAL | Status: DC
  Administered 2016-08-19: 20 mg via ORAL
  Filled 2016-08-18: qty 1

## 2016-08-18 MED ORDER — INSULIN ASPART 100 UNIT/ML ~~LOC~~ SOLN: 0.0000 [IU] | Freq: Every day | SUBCUTANEOUS | Status: DC

## 2016-08-18 MED ORDER — UMECLIDINIUM BROMIDE 62.5 MCG/INH IN AEPB
1.0000 | INHALATION_SPRAY | Freq: Every day | RESPIRATORY_TRACT | Status: DC
  Administered 2016-08-19: 1 via RESPIRATORY_TRACT
  Filled 2016-08-18: qty 7

## 2016-08-18 MED ORDER — ZOLPIDEM TARTRATE 5 MG PO TABS: 5.0000 mg | ORAL_TABLET | Freq: Every evening | ORAL | Status: DC | PRN

## 2016-08-18 MED ORDER — ENOXAPARIN SODIUM 40 MG/0.4ML ~~LOC~~ SOLN
40.0000 mg | SUBCUTANEOUS | Status: DC
  Administered 2016-08-18: 40 mg via SUBCUTANEOUS
  Filled 2016-08-18: qty 0.4

## 2016-08-18 MED ORDER — SODIUM CHLORIDE 0.9 % IV SOLN
INTRAVENOUS | Status: AC
  Administered 2016-08-18: 23:00:00 via INTRAVENOUS

## 2016-08-18 MED ORDER — HYDROCODONE-ACETAMINOPHEN 5-325 MG PO TABS: 1.0000 | ORAL_TABLET | ORAL | Status: DC | PRN

## 2016-08-18 MED ORDER — NITROGLYCERIN 0.4 MG SL SUBL: 0.4000 mg | SUBLINGUAL_TABLET | SUBLINGUAL | Status: DC | PRN

## 2016-08-18 MED ORDER — OXYBUTYNIN CHLORIDE 5 MG PO TABS
5.0000 mg | ORAL_TABLET | Freq: Every day | ORAL | Status: DC
  Administered 2016-08-19: 5 mg via ORAL
  Filled 2016-08-18: qty 1

## 2016-08-18 MED ORDER — ASPIRIN 325 MG PO TABS
325.0000 mg | ORAL_TABLET | Freq: Once | ORAL | Status: AC
  Administered 2016-08-18: 325 mg via ORAL
  Filled 2016-08-18: qty 1

## 2016-08-18 MED ORDER — ALPRAZOLAM 0.5 MG PO TABS
0.2500 mg | ORAL_TABLET | Freq: Two times a day (BID) | ORAL | Status: DC | PRN
  Administered 2016-08-19: 0.25 mg via ORAL
  Filled 2016-08-18: qty 1

## 2016-08-18 MED ORDER — AMLODIPINE BESYLATE 5 MG PO TABS
5.0000 mg | ORAL_TABLET | Freq: Every day | ORAL | Status: DC
  Administered 2016-08-19: 5 mg via ORAL
  Filled 2016-08-18: qty 1

## 2016-08-18 NOTE — ED Notes (Signed)
Report given to [facility] on Dept 300, all questions answered

## 2016-08-18 NOTE — ED Triage Notes (Signed)
Pt walked to the bathroom and back to her room without becoming SOB

## 2016-08-18 NOTE — ED Triage Notes (Signed)
Complain of being SOB that started today.

## 2016-08-18 NOTE — ED Notes (Signed)
CRITICAL VALUE ALERT  Critical value received:  troponin  Date of notification:  11/4/1 Time of notification:  [id number] Critical value read back: yes  Nurse who received alert:  Ilda Mori  MD notified (1st page):  zammit  Time of first page:  [phone]  MD notified (2nd page):  Time of second page:  Responding MD: zammit  Time MD responded: [phone]

## 2016-08-18 NOTE — ED Provider Notes (Signed)
[facility] DEPT Provider Note   CSN: [id number] Arrival date & time: 08/18/16  1623     History   Chief Complaint Chief Complaint  Patient presents with  . Shortness of Breath    HPI Christina Horn is a 70 y.o. female.  Patient states that today she started having some shortness of breath with pressure sensation in her chest. Sometimes worse with exertion sometimes not   The history is provided by the patient. No language interpreter was used.  Shortness of Breath  This is a new problem. The problem occurs intermittently.The current episode started 12 to 24 hours ago. The problem has been resolved. Pertinent negatives include no headaches, no ear pain, no cough, no chest pain, no abdominal pain and no rash. Precipitated by: Unknown. Risk factors: Hypertension diabetes.    Past Medical History:  Diagnosis Date  . Anxiety    h/o anxiety attack  . Arthritis    back meds not required  . Breast cancer ([place])    left breast  . Breast cancer, left breast ([place]) 05/09/2011  . Diabetes mellitus    borderline  . Hypertension   . Sleep apnea    Uses CPAP    Patient Active Problem List   Diagnosis Date Noted  . Depression with anxiety 08/18/2016  . Chest tightness or pressure 08/18/2016  . Non-insulin dependent type 2 diabetes mellitus ([place]) 08/18/2016  . Hypertension 08/18/2016  . OSA on CPAP 08/18/2016  . Kidney disease 08/18/2016  . SOB (shortness of breath) 08/18/2016  . Osteopenia 10/27/2015  . Breast cancer of lower-outer quadrant of left female breast ([place]) 05/09/2011    Past Surgical History:  Procedure Laterality Date  . ABDOMINAL HYSTERECTOMY    . BREAST SURGERY    . CHOLECYSTECTOMY    . left leg vein striping    . MASTECTOMY  06/07/11   left   . TRACHEOSTOMY      OB History    No data available       Home Medications    Prior to Admission medications   Medication Sig Start Date End Date Taking? Authorizing Provider  alendronate (FOSAMAX) 70  MG tablet Take 1 tablet (70 mg total) by mouth once a week. Take with a full glass of water on an empty stomach. 02/23/14  Yes Minette Headland, NP  amLODipine (NORVASC) 5 MG tablet Take 5 mg by mouth daily.     Yes Historical Provider, MD  anastrozole (ARIMIDEX) 1 MG tablet TAKE 1 TABLET (1 MG TOTAL) BY MOUTH DAILY. 06/08/16  Yes Nicholas Lose, MD  aspirin 81 MG tablet Take 81 mg by mouth daily.     Yes Historical Provider, MD  citalopram (CELEXA) 20 MG tablet Take 20 mg by mouth daily.     Yes Historical Provider, MD  fish oil-omega-3 fatty acids 1000 MG capsule Take 2 g by mouth daily.     Yes Historical Provider, MD  hydrochlorothiazide 25 MG tablet Take 25 mg by mouth daily.     Yes Historical Provider, MD  INCRUSE ELLIPTA 62.5 MCG/INH AEPB Inhale 1 puff into the lungs daily. 08/08/16  Yes Historical Provider, MD  KLOR-CON M20 20 MEQ tablet Take 1 tablet by mouth daily. 05/30/16  Yes Historical Provider, MD  losartan (COZAAR) 25 MG tablet  02/05/13  Yes Historical Provider, MD  Melatonin 1 MG CAPS Take by mouth 1 day or 1 dose.     Yes Historical Provider, MD  metFORMIN (GLUCOPHAGE) 500 MG tablet Take  500 mg by mouth 1 day or 1 dose. Take 1/2 once a day    Yes Historical Provider, MD  oxybutynin (DITROPAN) 5 MG tablet daily. 12/05/13  Yes Historical Provider, MD  VITAMIN D, CHOLECALCIFEROL, PO Take by mouth.     Yes Historical Provider, MD  potassium chloride SA (K-DUR,KLOR-CON) 20 MEQ tablet Take 1 tablet (20 mEq total) by mouth daily. 01/21/12 06/09/14  Eston Esters, MD    Family History Family History  Problem Relation Age of Onset  . Cancer Mother     breast  . Cancer Sister     lung  . Cancer Maternal Aunt     lung  . Cancer Maternal Grandmother     unknown    Social History Social History  Substance Use Topics  . Smoking status: Former Smoker    Packs/day: 1.50    Years: 25.00    Types: Cigarettes    Quit date: 10/19/2001  . Smokeless tobacco: Never Used  . Alcohol use No      Allergies   Review of patient's allergies indicates no known allergies.   Review of Systems Review of Systems  Constitutional: Negative for appetite change and fatigue.  HENT: Negative for congestion, ear discharge, ear pain and sinus pressure.   Eyes: Negative for discharge.  Respiratory: Positive for shortness of breath. Negative for cough.   Cardiovascular: Negative for chest pain.  Gastrointestinal: Negative for abdominal pain and diarrhea.  Genitourinary: Negative for frequency and hematuria.  Musculoskeletal: Negative for back pain.  Skin: Negative for rash.  Neurological: Negative for seizures and headaches.  Psychiatric/Behavioral: Negative for hallucinations.     Physical Exam Updated Vital Signs BP 160/62 (BP Location: Right Arm)   Pulse 103   Temp 98 F (36.7 C) (Oral)   Resp 16   Ht 5' (1.524 m)   Wt 180 lb (81.6 kg)   SpO2 96%   BMI 35.15 kg/m   Physical Exam  Constitutional: She is oriented to person, place, and time. She appears well-developed.  HENT:  Head: Normocephalic.  Eyes: Conjunctivae and EOM are normal. No scleral icterus.  Neck: Neck supple. No thyromegaly present.  Cardiovascular: Normal rate and regular rhythm.  Exam reveals no gallop and no friction rub.   No murmur heard. Pulmonary/Chest: No stridor. She has no wheezes. She has no rales. She exhibits no tenderness.  Abdominal: She exhibits no distension. There is no tenderness. There is no rebound.  Musculoskeletal: Normal range of motion. She exhibits no edema.  Lymphadenopathy:    She has no cervical adenopathy.  Neurological: She is oriented to person, place, and time. She exhibits normal muscle tone. Coordination normal.  Skin: No rash noted. No erythema.  Psychiatric: She has a normal mood and affect. Her behavior is normal.     ED Treatments / Results  Labs (all labs ordered are listed, but only abnormal results are displayed) Labs Reviewed  CBC WITH  DIFFERENTIAL/PLATELET - Abnormal; Notable for the following:       Result Value   Neutro Abs 8.2 (*)    All other components within normal limits  COMPREHENSIVE METABOLIC PANEL - Abnormal; Notable for the following:    Glucose, Bld 111 (*)    Creatinine, Ser 1.10 (*)    GFR calc non Af Amer 49 (*)    GFR calc Af Amer 57 (*)    All other components within normal limits  TROPONIN I - Abnormal; Notable for the following:    Troponin I  0.05 (*)    All other components within normal limits  D-DIMER, QUANTITATIVE (NOT AT [facility])    EKG  EKG Interpretation  Date/Time:  Saturday August 18 2016 16:32:59 EDT Ventricular Rate:  101 PR Interval:    QRS Duration: 129 QT Interval:  377 QTC Calculation: 489 R Axis:   -56 Text Interpretation:  Sinus tachycardia RBBB and LAFB Confirmed by Eleshia Wooley  MD, Jaemarie Hochberg [phone]) on 08/18/2016 6:40:11 PM       Radiology Dg Chest 2 View  Result Date: 08/18/2016 CLINICAL DATA:  Increasing shortness of breath) EXAM: CHEST  2 VIEW COMPARISON:  05/10/2016 FINDINGS: Midline trachea. Cardiomegaly accentuated by AP portable technique. Atherosclerosis in the transverse aorta. No pleural effusion or pneumothorax. No congestive failure. Right base scarring. Moderate thoracic spondylosis. IMPRESSION: No acute cardiopulmonary disease. Cardiomegaly without congestive failure. Aortic atherosclerosis. Electronically Signed   By: Abigail Miyamoto M.D.   On: 08/18/2016 18:15    Procedures Procedures (including critical care time)  Medications Ordered in ED Medications  aspirin tablet 325 mg (not administered)     Initial Impression / Assessment and Plan / ED Course  I have reviewed the triage vital signs and the nursing notes.  Pertinent labs & imaging results that were available during my care of the patient were reviewed by me and considered in my medical decision making (see chart for details).  Clinical Course   Patient with elevated troponin. But no acute EKG  changes. She will be admitted to telemetry and have further cardiac workup   Final Clinical Impressions(s) / ED Diagnoses   Final diagnoses:  SOB (shortness of breath)    New Prescriptions New Prescriptions   No medications on file     Milton Ferguson, MD 08/18/16 1926

## 2016-08-18 NOTE — H&P (Addendum)
History and Physical    Christina Horn [id number] DOB: Jan 27, 1946 DOA: 08/18/2016  PCP: [name], MD   Patient coming from: Home  Chief Complaint: Dyspnea  HPI: Christina Horn is a 70 y.o. female with medical history significant for hypertension, non-insulin-dependent diabetes mellitus, COPD, OSA on CPAP, and cancer of the left breast status post mastectomy and chemotherapy and now on a REM index who presents to the emergency department with dyspnea that developed earlier today. Patient reports that she had been in her usual state of health until shortly after waking this morning when she noted shortness of breath. She first noticed this while at rest, describing a difficulty catching her breath. Throughout the course of the day, this has persisted with dyspnea waxing and waning, occurring both at rest and with exertion. She denies chest pain or palpitations, endorses chronic nonproductive cough, and denies any prior experience with these symptoms. There has been no recent rhinorrhea or sore throat and no fevers or chills. She denies recent sick contacts or long distance travel. There has been no lower extremity swelling or tenderness and no orthopnea. She denies any personal or family history of VTE. Patient reports following with cardiologist, Dr. Einar Gip in [place], and reports having some testing performed this past summer which she believes included a stress test that was negative.  ED Course: Upon arrival to the ED, patient is found to be afebrile, saturating mid 90s on room air, and with vital signs stable. EKG demonstrates sinus tachycardia with rate 101 and a chronic right bundle branch block with possible left anterior fascicular block. Chest x-ray is negative for acute cardiopulmonary disease. Features cardiomegaly without signs of CHF. Chemistry panel is notable for serum creatinine 1.10, up from a baseline of 0.8. CBC is unremarkable and troponin is elevated to a value of 0.05.  D-dimer was obtained and within the normal limits. Patient was treated with a full dose aspirin in the emergency department. She never did develop chest pain. She had persistence in her dyspnea while at rest in the ED, but she continued to [facility] well and saturations have remained in the 90s, even with exertion. She will be observed on telemetry unit for ongoing evaluation and management of acute dyspnea with elevated troponin concerning for possible atypical angina, or alternatively this may represent a mild exacerbation in her COPD.  Review of Systems:  All other systems reviewed and apart from HPI, are negative.  Past Medical History:  Diagnosis Date  . Anxiety    h/o anxiety attack  . Arthritis    back meds not required  . Breast cancer ([place])    left breast  . Breast cancer, left breast ([place]) 05/09/2011  . Diabetes mellitus    borderline  . Hypertension   . Sleep apnea    Uses CPAP    Past Surgical History:  Procedure Laterality Date  . ABDOMINAL HYSTERECTOMY    . BREAST SURGERY    . CHOLECYSTECTOMY    . left leg vein striping    . MASTECTOMY  06/07/11   left   . TRACHEOSTOMY       reports that she quit smoking about 14 years ago. Her smoking use included Cigarettes. She has a 37.50 pack-year smoking history. She has never used smokeless tobacco. She reports that she does not drink alcohol or use drugs.  No Known Allergies  Family History  Problem Relation Age of Onset  . Cancer Mother     breast  . Cancer Sister  lung  . Cancer Maternal Aunt     lung  . Cancer Maternal Grandmother     unknown     Prior to Admission medications   Medication Sig Start Date End Date Taking? Authorizing Provider  alendronate (FOSAMAX) 70 MG tablet Take 1 tablet (70 mg total) by mouth once a week. Take with a full glass of water on an empty stomach. 02/23/14  Yes Minette Headland, NP  amLODipine (NORVASC) 5 MG tablet Take 5 mg by mouth daily.     Yes Historical Provider, MD    anastrozole (ARIMIDEX) 1 MG tablet TAKE 1 TABLET (1 MG TOTAL) BY MOUTH DAILY. 06/08/16  Yes Nicholas Lose, MD  aspirin 81 MG tablet Take 81 mg by mouth daily.     Yes Historical Provider, MD  citalopram (CELEXA) 20 MG tablet Take 20 mg by mouth daily.     Yes Historical Provider, MD  fish oil-omega-3 fatty acids 1000 MG capsule Take 2 g by mouth daily.     Yes Historical Provider, MD  hydrochlorothiazide 25 MG tablet Take 25 mg by mouth daily.     Yes Historical Provider, MD  INCRUSE ELLIPTA 62.5 MCG/INH AEPB Inhale 1 puff into the lungs daily. 08/08/16  Yes Historical Provider, MD  KLOR-CON M20 20 MEQ tablet Take 1 tablet by mouth daily. 05/30/16  Yes Historical Provider, MD  losartan (COZAAR) 25 MG tablet  02/05/13  Yes Historical Provider, MD  Melatonin 1 MG CAPS Take by mouth 1 day or 1 dose.     Yes Historical Provider, MD  metFORMIN (GLUCOPHAGE) 500 MG tablet Take 500 mg by mouth 1 day or 1 dose. Take 1/2 once a day    Yes Historical Provider, MD  oxybutynin (DITROPAN) 5 MG tablet daily. 12/05/13  Yes Historical Provider, MD  VITAMIN D, CHOLECALCIFEROL, PO Take by mouth.     Yes Historical Provider, MD  potassium chloride SA (K-DUR,KLOR-CON) 20 MEQ tablet Take 1 tablet (20 mEq total) by mouth daily. 01/21/12 06/09/14  Eston Esters, MD    Physical Exam: Vitals:   08/18/16 1634 08/18/16 1730 08/18/16 1800 08/18/16 1852  BP:  135/62 140/57 160/62  Pulse:  98 87 103  Resp:  22 17 16   Temp:      TempSrc:      SpO2:  97% 96% 96%  Weight: 81.6 kg (180 lb)     Height: 5' (1.524 m)         Constitutional: No respiratory distress, appears comfortable Eyes: PERTLA, lids and conjunctivae normal ENMT: Mucous membranes are moist. Posterior pharynx clear of any exudate or lesions.   Neck: normal, supple, no masses, no thyromegaly Respiratory: clear to auscultation bilaterally, no wheezing, no crackles. Mild tachypnea, pauses to catch breath occasionally.  Cardiovascular: S1 & S2 heard, regular  rate and rhythm. No extremity edema. No significant JVD. Abdomen: No distension, no tenderness, no masses palpated. Bowel sounds normal.  Musculoskeletal: no clubbing / cyanosis. No joint deformity upper and lower extremities. Normal muscle tone.  Skin: no significant rashes, lesions, ulcers. Warm, dry, well-perfused. Neurologic: CN 2-12 grossly intact. Sensation intact, DTR normal. Strength 5/5 in all 4 limbs.  Psychiatric: Normal judgment and insight. Alert and oriented x 3. Anxious.     Labs on Admission: I have personally reviewed following labs and imaging studies  CBC:  Recent Labs Lab 08/18/16 1652  WBC 10.0  NEUTROABS 8.2*  HGB 12.5  HCT 37.9  MCV 95.7  PLT [id number]   Basic Metabolic  Panel:  Recent Labs Lab 08/18/16 1652  NA 138  K 3.9  CL 106  CO2 24  GLUCOSE 111*  BUN 15  CREATININE 1.10*  CALCIUM 9.0   GFR: Estimated Creatinine Clearance: 44.4 mL/min (by C-G formula based on SCr of 1.1 mg/dL (H)). Liver Function Tests:  Recent Labs Lab 08/18/16 1652  AST 25  ALT 26  ALKPHOS 45  BILITOT 0.4  PROT 7.0  ALBUMIN 3.8   No results for input(s): LIPASE, AMYLASE in the last 168 hours. No results for input(s): AMMONIA in the last 168 hours. Coagulation Profile: No results for input(s): INR, PROTIME in the last 168 hours. Cardiac Enzymes:  Recent Labs Lab 08/18/16 1709  TROPONINI 0.05*   BNP (last 3 results) No results for input(s): PROBNP in the last 8760 hours. HbA1C: No results for input(s): HGBA1C in the last 72 hours. CBG: No results for input(s): GLUCAP in the last 168 hours. Lipid Profile: No results for input(s): CHOL, HDL, LDLCALC, TRIG, CHOLHDL, LDLDIRECT in the last 72 hours. Thyroid Function Tests: No results for input(s): TSH, T4TOTAL, FREET4, T3FREE, THYROIDAB in the last 72 hours. Anemia Panel: No results for input(s): VITAMINB12, FOLATE, FERRITIN, TIBC, IRON, RETICCTPCT in the last 72 hours. Urine analysis: No results found for:  COLORURINE, APPEARANCEUR, LABSPEC, PHURINE, GLUCOSEU, HGBUR, BILIRUBINUR, KETONESUR, PROTEINUR, UROBILINOGEN, NITRITE, LEUKOCYTESUR Sepsis Labs: @LABRCNTIP (procalcitonin:4,lacticidven:4) )No results found for this or any previous visit (from the past 240 hour(s)).   Radiological Exams on Admission: Dg Chest 2 View  Result Date: 08/18/2016 CLINICAL DATA:  Increasing shortness of breath) EXAM: CHEST  2 VIEW COMPARISON:  05/10/2016 FINDINGS: Midline trachea. Cardiomegaly accentuated by AP portable technique. Atherosclerosis in the transverse aorta. No pleural effusion or pneumothorax. No congestive failure. Right base scarring. Moderate thoracic spondylosis. IMPRESSION: No acute cardiopulmonary disease. Cardiomegaly without congestive failure. Aortic atherosclerosis. Electronically Signed   By: Abigail Miyamoto M.D.   On: 08/18/2016 18:15    EKG: Independently reviewed. Sinus tachycardia (rate 101), RBBB  Assessment/Plan  1. Acute dyspnea with elevated troponin - Primary concern is for atypical angina, though this may represent a mild COPD exacerbation with demand ischemia  - She has not had chest pain, just dyspnea; hx of CAD is unclear, she follows with cardiology in [place] but those records not available for review at time of admission - Initial troponin elevated to 0.05; she was treated with ASA 325 mg in ED  - Plan to observe on telemetry, trend the troponin, and repeat EKG while treating with prn nebs - Continue daily ASA 81 and fish-oil; losartan held on admission d/t bump in SCr; Arimidex held on admission d/t association with ACS     2. COPD, OSA  - Pt reports dyspnea on admission, but no wheezing or respiratory distress evident  - CXR without acute cardiopulmonary disease  - Continue scheduled Incruse Elipta and prn DuoNeb; continue CPAP qHS   3. Kidney disease - SCr is 1.10 on admission, up from baseline of 0.8  - Pt appears a little dry on admission; plan to hold losartan and  HCTZ while providing a gentle IVF hydration overnight  - Repeat chem panel in am    4. Type II DM  - A1c 5.7% in May 2017 - Managed with metformin only at home; will hold this while in hospital  - Check CBG with meals and qHS - Start a low-intensity sliding-scale correctional insulin    5. Hypertension  - At goal currently  - Losartan and HCTZ held  on admission as she appears dry and has bump in SCr  - Continue Norvasc; labetalol IVPs available prn   6. Anxiety - Pt is anxious on admission - Continue Celexa; prn Ativan available     DVT prophylaxis: sq Lovenox  Code Status: Full  Family Communication: Discussed with patient Disposition Plan: Observe on telemetry Consults called: None Admission status: Observation    Vianne Bulls, MD [facility] Pager [phone]  If 7PM-7AM, please contact night-coverage [email or web address] Password [facility]  08/18/2016, 7:47 PM

## 2016-08-19 DIAGNOSIS — J410 Simple chronic bronchitis: Secondary | ICD-10-CM

## 2016-08-19 DIAGNOSIS — I10 Essential (primary) hypertension: Secondary | ICD-10-CM | POA: Diagnosis not present

## 2016-08-19 DIAGNOSIS — Z7982 Long term (current) use of aspirin: Secondary | ICD-10-CM | POA: Diagnosis not present

## 2016-08-19 DIAGNOSIS — Z87891 Personal history of nicotine dependence: Secondary | ICD-10-CM | POA: Diagnosis not present

## 2016-08-19 DIAGNOSIS — R0600 Dyspnea, unspecified: Secondary | ICD-10-CM | POA: Diagnosis not present

## 2016-08-19 DIAGNOSIS — Z79899 Other long term (current) drug therapy: Secondary | ICD-10-CM | POA: Diagnosis not present

## 2016-08-19 DIAGNOSIS — Z853 Personal history of malignant neoplasm of breast: Secondary | ICD-10-CM | POA: Diagnosis not present

## 2016-08-19 DIAGNOSIS — J449 Chronic obstructive pulmonary disease, unspecified: Secondary | ICD-10-CM | POA: Diagnosis not present

## 2016-08-19 DIAGNOSIS — E119 Type 2 diabetes mellitus without complications: Secondary | ICD-10-CM | POA: Diagnosis not present

## 2016-08-19 DIAGNOSIS — Z7984 Long term (current) use of oral hypoglycemic drugs: Secondary | ICD-10-CM | POA: Diagnosis not present

## 2016-08-19 DIAGNOSIS — R0602 Shortness of breath: Secondary | ICD-10-CM | POA: Diagnosis not present

## 2016-08-19 DIAGNOSIS — R748 Abnormal levels of other serum enzymes: Secondary | ICD-10-CM | POA: Diagnosis not present

## 2016-08-19 LAB — GLUCOSE, CAPILLARY
Glucose-Capillary: 97 mg/dL (ref 65–99)
Glucose-Capillary: 112 mg/dL — ABNORMAL HIGH (ref 65–99)

## 2016-08-19 LAB — BASIC METABOLIC PANEL
ANION GAP: 3 — AB (ref 5–15)
BUN: 13 mg/dL (ref 6–20)
CALCIUM: 8.4 mg/dL — AB (ref 8.9–10.3)
CO2: 27 mmol/L (ref 22–32)
Chloride: 110 mmol/L (ref 101–111)
Creatinine, Ser: 0.76 mg/dL (ref 0.44–1.00)
Glucose, Bld: 105 mg/dL — ABNORMAL HIGH (ref 65–99)
Potassium: 3.6 mmol/L (ref 3.5–5.1)
SODIUM: 140 mmol/L (ref 135–145)

## 2016-08-19 LAB — LIPID PANEL
CHOLESTEROL: 169 mg/dL (ref 0–200)
HDL: 41 mg/dL (ref >40)
LDL Cholesterol: 90 mg/dL (ref 0–99)
TRIGLYCERIDES: 188 mg/dL — AB (ref <150)
Total CHOL/HDL Ratio: 4.1 RATIO
VLDL: 38 mg/dL (ref 0–40)

## 2016-08-19 LAB — URINALYSIS, ROUTINE W REFLEX MICROSCOPIC
Bilirubin Urine: NEGATIVE
Glucose, UA: NEGATIVE mg/dL
Hgb urine dipstick: NEGATIVE
Ketones, ur: NEGATIVE mg/dL
Leukocytes, UA: NEGATIVE
Nitrite: NEGATIVE
Protein, ur: NEGATIVE mg/dL
Specific Gravity, Urine: 1.015 (ref 1.005–1.030)
pH: 8 (ref 5.0–8.0)

## 2016-08-19 LAB — TROPONIN I
TROPONIN I: 0.08 ng/mL — AB (ref <0.03)
TROPONIN I: 0.07 ng/mL — AB (ref <0.03)
Troponin I: 0.05 ng/mL

## 2016-08-19 NOTE — Care Management Obs Status (Signed)
[place] NOTIFICATION   Patient Details  Name: Christina Horn MRN: [id number] Date of Birth: [birth year]   Medicare Observation Status Notification Given:  No  Discharged   Briant Sites, RN 08/19/2016, 12:23 PM

## 2016-08-19 NOTE — Progress Notes (Signed)
CRITICAL VALUE ALERT  Critical value received:  Troponin 0.08  Date of notification:  08/18/16  Time of notification:  2300  Critical value read back:No.  Nurse who received alert:  Ames Dura, RN  MD notified (1st page):  Opyd  Time of first page:  [phone]   Lab did not call nurse to alert about critical value. Nurse saw critical value in pt's chart at 0238 and paged Dr. Myna Hidalgo about critical value.

## 2016-08-19 NOTE — Progress Notes (Signed)
Patient states understanding of discharge instructions.  

## 2016-08-19 NOTE — Discharge Summary (Signed)
Physician Discharge Summary  Christina Horn [id number] DOB: April 27, 1946 DOA: 08/18/2016  PCP: [name], MD  Admit date: 08/18/2016 Discharge date: 08/19/2016  Time spent: 45 minutes  Recommendations for Outpatient Follow-up:  -Will be discharged home today. -Advised to follow up with PCP in 2 weeks.   Discharge Diagnoses:  Principal Problem:   Dyspnea Active Problems:   Depression with anxiety   Non-insulin dependent type 2 diabetes mellitus (HCC)   Hypertension   OSA on CPAP   Kidney disease   SOB (shortness of breath)   COPD (chronic obstructive pulmonary disease) (HCC)   Elevated troponin   Discharge Condition: Stable and improved  Filed Weights   08/18/16 1626 08/18/16 1634 08/19/16 0510  Weight: 83.9 kg (185 lb) 81.6 kg (180 lb) 81.5 kg (179 lb 9.6 oz)    History of present illness:  As per Dr. Myna Hidalgo on 11/4: Christina Horn is a 70 y.o. female with medical history significant for hypertension, non-insulin-dependent diabetes mellitus, COPD, OSA on CPAP, and cancer of the left breast status post mastectomy and chemotherapy and now on a REM index who presents to the emergency department with dyspnea that developed earlier today. Patient reports that she had been in her usual state of health until shortly after waking this morning when she noted shortness of breath. She first noticed this while at rest, describing a difficulty catching her breath. Throughout the course of the day, this has persisted with dyspnea waxing and waning, occurring both at rest and with exertion. She denies chest pain or palpitations, endorses chronic nonproductive cough, and denies any prior experience with these symptoms. There has been no recent rhinorrhea or sore throat and no fevers or chills. She denies recent sick contacts or long distance travel. There has been no lower extremity swelling or tenderness and no orthopnea. She denies any personal or family history of VTE. Patient reports  following with cardiologist, Dr. Einar Gip in [place], and reports having some testing performed this past summer which she believes included a stress test that was negative.  ED Course: Upon arrival to the ED, patient is found to be afebrile, saturating mid 90s on room air, and with vital signs stable. EKG demonstrates sinus tachycardia with rate 101 and a chronic right bundle branch block with possible left anterior fascicular block. Chest x-ray is negative for acute cardiopulmonary disease. Features cardiomegaly without signs of CHF. Chemistry panel is notable for serum creatinine 1.10, up from a baseline of 0.8. CBC is unremarkable and troponin is elevated to a value of 0.05. D-dimer was obtained and within the normal limits. Patient was treated with a full dose aspirin in the emergency department. She never did develop chest pain. She had persistence in her dyspnea while at rest in the ED, but she continued to [facility] well and saturations have remained in the 90s, even with exertion. She will be observed on telemetry unit for ongoing evaluation and management of acute dyspnea with elevated troponin concerning for possible atypical angina, or alternatively this may represent a mild exacerbation in her COPD.  Hospital Course:   Acute SOB -Likely due to COPD exacerbated with mild URI given symptoms of runny nose and cough. -No wheezing, no need for steroids or abx.  Elevated Troponin -Stable, no CP, EKG without ischemic abnormalities. -No plans for further cardiac work up.  Procedures:  None   Consultations:  None  Discharge Instructions  Discharge Instructions    Diet - low sodium heart healthy  Complete by:  As directed    Increase activity slowly    Complete by:  As directed        Medication List    STOP taking these medications   hydrochlorothiazide 25 MG tablet Commonly known as:  HYDRODIURIL   Melatonin 1 MG Caps   metFORMIN 500 MG tablet Commonly known as:   GLUCOPHAGE     TAKE these medications   alendronate 70 MG tablet Commonly known as:  FOSAMAX Take 1 tablet (70 mg total) by mouth once a week. Take with a full glass of water on an empty stomach.   amLODipine 5 MG tablet Commonly known as:  NORVASC Take 5 mg by mouth daily.   anastrozole 1 MG tablet Commonly known as:  ARIMIDEX TAKE 1 TABLET (1 MG TOTAL) BY MOUTH DAILY.   aspirin 81 MG tablet Take 81 mg by mouth daily.   citalopram 20 MG tablet Commonly known as:  CELEXA Take 20 mg by mouth daily.   fish oil-omega-3 fatty acids 1000 MG capsule Take 2 g by mouth daily.   INCRUSE ELLIPTA 62.5 MCG/INH Aepb Generic drug:  umeclidinium bromide Inhale 1 puff into the lungs daily.   KLOR-CON M20 20 MEQ tablet Generic drug:  potassium chloride SA Take 1 tablet by mouth daily. What changed:  Another medication with the same name was removed. Continue taking this medication, and follow the directions you see here.   losartan 25 MG tablet Commonly known as:  COZAAR   oxybutynin 5 MG tablet Commonly known as:  DITROPAN daily.   VITAMIN D (CHOLECALCIFEROL) PO Take by mouth.      No Known Allergies    The results of significant diagnostics from this hospitalization (including imaging, microbiology, ancillary and laboratory) are listed below for reference.    Significant Diagnostic Studies: Dg Chest 2 View  Result Date: 08/18/2016 CLINICAL DATA:  Increasing shortness of breath) EXAM: CHEST  2 VIEW COMPARISON:  05/10/2016 FINDINGS: Midline trachea. Cardiomegaly accentuated by AP portable technique. Atherosclerosis in the transverse aorta. No pleural effusion or pneumothorax. No congestive failure. Right base scarring. Moderate thoracic spondylosis. IMPRESSION: No acute cardiopulmonary disease. Cardiomegaly without congestive failure. Aortic atherosclerosis. Electronically Signed   By: Abigail Miyamoto M.D.   On: 08/18/2016 18:15    Microbiology: No results found for this or  any previous visit (from the past 240 hour(s)).   Labs: Basic Metabolic Panel:  Recent Labs Lab 08/18/16 1652 08/19/16 0424  NA 138 140  K 3.9 3.6  CL 106 110  CO2 24 27  GLUCOSE 111* 105*  BUN 15 13  CREATININE 1.10* 0.76  CALCIUM 9.0 8.4*   Liver Function Tests:  Recent Labs Lab 08/18/16 1652  AST 25  ALT 26  ALKPHOS 45  BILITOT 0.4  PROT 7.0  ALBUMIN 3.8   No results for input(s): LIPASE, AMYLASE in the last 168 hours. No results for input(s): AMMONIA in the last 168 hours. CBC:  Recent Labs Lab 08/18/16 1652  WBC 10.0  NEUTROABS 8.2*  HGB 12.5  HCT 37.9  MCV 95.7  PLT 286   Cardiac Enzymes:  Recent Labs Lab 08/18/16 1709 08/18/16 2256 08/19/16 0424 08/19/16 1021  TROPONINI 0.05* 0.08* 0.07* 0.05*   BNP: BNP (last 3 results)  Recent Labs  08/18/16 2016  BNP 88.0    ProBNP (last 3 results) No results for input(s): PROBNP in the last 8760 hours.  CBG:  Recent Labs Lab 08/18/16 2113 08/19/16 0744  GLUCAP 113* 97  SignedLelon Frohlich  [facility] Pager: [phone] 08/19/2016, 11:25 AM

## 2016-08-20 LAB — HEMOGLOBIN A1C
HEMOGLOBIN A1C: 5.9 % — AB (ref 4.8–5.6)
Mean Plasma Glucose: 123 mg/dL

## 2016-08-20 LAB — T4, FREE: Free T4: 0.91 ng/dL (ref 0.61–1.12)

## 2016-08-29 DIAGNOSIS — N390 Urinary tract infection, site not specified: Secondary | ICD-10-CM | POA: Diagnosis not present

## 2016-08-29 DIAGNOSIS — E119 Type 2 diabetes mellitus without complications: Secondary | ICD-10-CM | POA: Diagnosis not present

## 2016-08-29 DIAGNOSIS — I10 Essential (primary) hypertension: Secondary | ICD-10-CM | POA: Diagnosis not present

## 2016-08-29 DIAGNOSIS — M810 Age-related osteoporosis without current pathological fracture: Secondary | ICD-10-CM | POA: Diagnosis not present

## 2016-09-05 ENCOUNTER — Other Ambulatory Visit (HOSPITAL_COMMUNITY): Payer: Self-pay | Admitting: Respiratory Therapy

## 2016-09-05 DIAGNOSIS — I10 Essential (primary) hypertension: Secondary | ICD-10-CM | POA: Diagnosis not present

## 2016-09-05 DIAGNOSIS — F419 Anxiety disorder, unspecified: Secondary | ICD-10-CM | POA: Diagnosis not present

## 2016-09-05 DIAGNOSIS — R0600 Dyspnea, unspecified: Secondary | ICD-10-CM | POA: Diagnosis not present

## 2016-09-05 DIAGNOSIS — E119 Type 2 diabetes mellitus without complications: Secondary | ICD-10-CM | POA: Diagnosis not present

## 2016-09-11 DIAGNOSIS — E669 Obesity, unspecified: Secondary | ICD-10-CM | POA: Diagnosis not present

## 2016-09-11 DIAGNOSIS — I10 Essential (primary) hypertension: Secondary | ICD-10-CM | POA: Diagnosis not present

## 2016-09-11 DIAGNOSIS — R0609 Other forms of dyspnea: Secondary | ICD-10-CM | POA: Diagnosis not present

## 2016-09-11 DIAGNOSIS — R9431 Abnormal electrocardiogram [ECG] [EKG]: Secondary | ICD-10-CM | POA: Diagnosis not present

## 2016-09-14 ENCOUNTER — Ambulatory Visit (HOSPITAL_COMMUNITY)
Admission: RE | Admit: 2016-09-14 | Discharge: 2016-09-14 | Disposition: A | Discharge status: Home / Self Care | Payer: Medicare Other | Source: Ambulatory Visit | Attending: Internal Medicine | Admitting: Internal Medicine

## 2016-09-14 DIAGNOSIS — R0600 Dyspnea, unspecified: Secondary | ICD-10-CM | POA: Diagnosis not present

## 2016-09-14 LAB — PULMONARY FUNCTION TEST
DL/VA % pred: 135 %
DL/VA: 5.54 ml/min/mmHg/L
DLCO unc % pred: 72 %
DLCO unc: 12.8 ml/min/mmHg
FEF 25-75 Post: 0.75 L/sec
FEF 25-75 Pre: 0.68 L/sec
FEF2575-%Change-Post: 10 %
FEF2575-%PRED-PRE: 43 %
FEF2575-%Pred-Post: 48 %
FEV1-%Change-Post: 2 %
FEV1-%Pred-Post: 64 %
FEV1-%Pred-Pre: 63 %
FEV1-Post: 1.14 L
FEV1-Pre: 1.11 L
FEV1FVC-%CHANGE-POST: 4 %
FEV1FVC-%Pred-Pre: 93 %
FEV6-%CHANGE-POST: -2 %
FEV6-%PRED-POST: 68 %
FEV6-%Pred-Pre: 69 %
FEV6-PRE: 1.57 L
FEV6-Post: 1.53 L
FEV6FVC-%PRED-PRE: 105 %
FEV6FVC-%Pred-Post: 105 %
FVC-%CHANGE-POST: -2 %
FVC-%Pred-Post: 65 %
FVC-%Pred-Pre: 66 %
FVC-POST: 1.53 L
FVC-Pre: 1.57 L
POST FEV6/FVC RATIO: 100 %
Post FEV1/FVC ratio: 74 %
Pre FEV1/FVC ratio: 71 %
Pre FEV6/FVC Ratio: 100 %
RV % pred: 103 %
RV: 2.04 L
TLC % PRED: 82 %
TLC: 3.54 L

## 2016-09-14 MED ORDER — ALBUTEROL SULFATE (2.5 MG/3ML) 0.083% IN NEBU
2.5000 mg | INHALATION_SOLUTION | Freq: Once | RESPIRATORY_TRACT | Status: AC
  Administered 2016-09-14: 2.5 mg via RESPIRATORY_TRACT

## 2016-10-17 DIAGNOSIS — I351 Nonrheumatic aortic (valve) insufficiency: Secondary | ICD-10-CM | POA: Diagnosis not present

## 2016-10-17 DIAGNOSIS — E119 Type 2 diabetes mellitus without complications: Secondary | ICD-10-CM | POA: Diagnosis not present

## 2016-10-17 DIAGNOSIS — I10 Essential (primary) hypertension: Secondary | ICD-10-CM | POA: Diagnosis not present

## 2016-10-17 DIAGNOSIS — J449 Chronic obstructive pulmonary disease, unspecified: Secondary | ICD-10-CM | POA: Diagnosis not present

## 2016-10-24 ENCOUNTER — Other Ambulatory Visit: Payer: Self-pay | Admitting: Emergency Medicine

## 2016-10-24 DIAGNOSIS — C50512 Malignant neoplasm of lower-outer quadrant of left female breast: Secondary | ICD-10-CM

## 2016-10-24 NOTE — Assessment & Plan Note (Signed)
Left breast invasive ductal carcinoma T3 N1 M0 status post mastectomy, Oncotype DX score 28 status post 4 cycles of Taxotere and Cytoxan followed by chest wall radiation and Arimidex 1 mg daily since March 2013  Arimidex toxicities: Denies any hot flashes or myalgias. I discussed the issue of duration of therapy. I discussed the results of MA 17 clinical trial supporting the extended adjuvant therapy for high-risk patients, patient wishes to stay on antiestrogen therapy for10 years..  Breast cancer surveillance: 1. Physical examination on the left breast and right chest wall and 10/25/2016 is normal 2. Mammogram left breast 04/29/2015 is normal 3. Bone density March 2015 T score of -2.1 and the femur osteopenia. I ordered another bone density test to be done in March 2017 to be done at [facility].  Osteopenia: Currently on Fosamax and calcium and vitamin D  Return to clinic in 1 year for follow-up

## 2016-10-25 ENCOUNTER — Encounter: Payer: Self-pay | Admitting: Hematology and Oncology

## 2016-10-25 ENCOUNTER — Other Ambulatory Visit (HOSPITAL_BASED_OUTPATIENT_CLINIC_OR_DEPARTMENT_OTHER): Payer: Medicare Other

## 2016-10-25 ENCOUNTER — Ambulatory Visit (HOSPITAL_BASED_OUTPATIENT_CLINIC_OR_DEPARTMENT_OTHER): Payer: Medicare Other | Admitting: Hematology and Oncology

## 2016-10-25 DIAGNOSIS — M8580 Other specified disorders of bone density and structure, unspecified site: Secondary | ICD-10-CM

## 2016-10-25 DIAGNOSIS — C50512 Malignant neoplasm of lower-outer quadrant of left female breast: Secondary | ICD-10-CM

## 2016-10-25 DIAGNOSIS — Z170 Estrogen receptor positive status [ER+]: Secondary | ICD-10-CM | POA: Diagnosis not present

## 2016-10-25 DIAGNOSIS — Z79811 Long term (current) use of aromatase inhibitors: Secondary | ICD-10-CM | POA: Diagnosis not present

## 2016-10-25 LAB — COMPREHENSIVE METABOLIC PANEL
ALK PHOS: 55 U/L (ref 40–150)
ALT: 21 U/L (ref 0–55)
ANION GAP: 7 meq/L (ref 3–11)
AST: 18 U/L (ref 5–34)
Albumin: 3.4 g/dL — ABNORMAL LOW (ref 3.5–5.0)
BILIRUBIN TOTAL: 0.41 mg/dL (ref 0.20–1.20)
BUN: 13.4 mg/dL (ref 7.0–26.0)
CALCIUM: 9.4 mg/dL (ref 8.4–10.4)
CO2: 28 mEq/L (ref 22–29)
CREATININE: 0.8 mg/dL (ref 0.6–1.1)
Chloride: 108 mEq/L (ref 98–109)
EGFR: 76 mL/min/{1.73_m2} — ABNORMAL LOW (ref >90)
Glucose: 108 mg/dl (ref 70–140)
Potassium: 4.7 mEq/L (ref 3.5–5.1)
Sodium: 143 mEq/L (ref 136–145)
TOTAL PROTEIN: 6.4 g/dL (ref 6.4–8.3)

## 2016-10-25 LAB — CBC WITH DIFFERENTIAL/PLATELET
BASO%: 0.9 % (ref 0.0–2.0)
Basophils Absolute: 0.1 10*3/uL (ref 0.0–0.1)
EOS ABS: 0.2 10*3/uL (ref 0.0–0.5)
EOS%: 2.9 % (ref 0.0–7.0)
HEMATOCRIT: 37.2 % (ref 34.8–46.6)
HGB: 12.2 g/dL (ref 11.6–15.9)
LYMPH#: 1.7 10*3/uL (ref 0.9–3.3)
LYMPH%: 24 % (ref 14.0–49.7)
MCH: 31.1 pg (ref 25.1–34.0)
MCHC: 32.9 g/dL (ref 31.5–36.0)
MCV: 94.7 fL (ref 79.5–101.0)
MONO#: 0.6 10*3/uL (ref 0.1–0.9)
MONO%: 8 % (ref 0.0–14.0)
NEUT%: 64.2 % (ref 38.4–76.8)
NEUTROS ABS: 4.5 10*3/uL (ref 1.5–6.5)
PLATELETS: 274 10*3/uL (ref 145–400)
RBC: 3.93 10*6/uL (ref 3.70–5.45)
RDW: 13.3 % (ref 11.2–14.5)
WBC: 7 10*3/uL (ref 3.9–10.3)

## 2016-10-25 MED ORDER — ANASTROZOLE 1 MG PO TABS: 1.0000 mg | ORAL_TABLET | Freq: Every day | ORAL | 3 refills | Status: DC

## 2016-10-25 NOTE — Progress Notes (Signed)
Patient Care Team: [name], MD as PCP - General (Internal Medicine) [name], MD (Internal Medicine) Thea Silversmith, MD as Attending Physician (Radiation Oncology) Christene Slates, MD as Attending Physician (Radiology)  DIAGNOSIS:  Encounter Diagnosis  Name Primary?  . Malignant neoplasm of lower-outer quadrant of left breast of female, estrogen receptor positive ([place])     SUMMARY OF ONCOLOGIC HISTORY:   Breast cancer of lower-outer quadrant of left female breast ([place])   06/07/2011 Surgery     left mastectomy : multiple foci of invasive ductal carcinoma grade 2 5.1 cm and 1 cm with high-grade DCIS, lymphovascular invasion , 1/7 Oscar lymph node ER 100%, PR and her percent, Ki-67 50%, HER-2 equivocal ratio 2 , Oncotype 28      06/20/2011 - 09/11/2011 Chemotherapy    TC X 4      10/08/2011 - 11/13/2011 Radiation Therapy    chest wall radiation therapy       12/25/2011 -  Anti-estrogen oral therapy    Arimidex 1 mg daily        CHIEF COMPLIANT: Follow-up on Arimidex  INTERVAL HISTORY: Christina Horn is a 71 year old with above-mentioned history left breast cancer treated with mastectomy followed by adjuvant chemotherapy and radiation. She is currently on Arimidex therapy. She completes 5 years of therapy by March 2018. She is doing quite well. She does not have any side effects from the treatment. She takes Fosamax for osteopenia. Denies any lumps or nodules in the breast.  REVIEW OF SYSTEMS:   Constitutional: Denies fevers, chills or abnormal weight loss Eyes: Denies blurriness of vision Ears, nose, mouth, throat, and face: Denies mucositis or sore throat Respiratory: Denies cough, dyspnea or wheezes Cardiovascular: Denies palpitation, chest discomfort Gastrointestinal:  Denies nausea, heartburn or change in bowel habits Skin: Denies abnormal skin rashes Lymphatics: Denies new lymphadenopathy or easy bruising Neurological:Denies numbness, tingling or new  weaknesses Behavioral/Psych: Mood is stable, no new changes  Extremities: No lower extremity edema Breast:  denies any pain or lumps or nodules in either breasts All other systems were reviewed with the patient and are negative.  I have reviewed the past medical history, past surgical history, social history and family history with the patient and they are unchanged from previous note.  ALLERGIES:  has No Known Allergies.  MEDICATIONS:  Current Outpatient Prescriptions  Medication Sig Dispense Refill  . alendronate (FOSAMAX) 70 MG tablet Take 1 tablet (70 mg total) by mouth once a week. Take with a full glass of water on an empty stomach. 12 tablet 3  . amLODipine (NORVASC) 5 MG tablet Take 5 mg by mouth daily.      Marland Kitchen anastrozole (ARIMIDEX) 1 MG tablet TAKE 1 TABLET (1 MG TOTAL) BY MOUTH DAILY. 30 tablet 5  . aspirin 81 MG tablet Take 81 mg by mouth daily.      . citalopram (CELEXA) 20 MG tablet Take 20 mg by mouth daily.      . fish oil-omega-3 fatty acids 1000 MG capsule Take 2 g by mouth daily.      . INCRUSE ELLIPTA 62.5 MCG/INH AEPB Inhale 1 puff into the lungs daily.    Marland Kitchen KLOR-CON M20 20 MEQ tablet Take 1 tablet by mouth daily.    Marland Kitchen losartan (COZAAR) 25 MG tablet     . oxybutynin (DITROPAN) 5 MG tablet daily.    Marland Kitchen VITAMIN D, CHOLECALCIFEROL, PO Take by mouth.       No current facility-administered medications for this visit.  PHYSICAL EXAMINATION: ECOG PERFORMANCE STATUS: 0 - Asymptomatic  Vitals:   10/25/16 1238  BP: (!) 167/71  Pulse: 76  Resp: 18  Temp: 97.9 F (36.6 C)   Filed Weights   10/25/16 1238  Weight: 180 lb 9.6 oz (81.9 kg)    GENERAL:alert, no distress and comfortable SKIN: skin color, texture, turgor are normal, no rashes or significant lesions EYES: normal, Conjunctiva are pink and non-injected, sclera clear OROPHARYNX:no exudate, no erythema and lips, buccal mucosa, and tongue normal  NECK: supple, thyroid normal size, non-tender, without  nodularity LYMPH:  no palpable lymphadenopathy in the cervical, axillary or inguinal LUNGS: clear to auscultation and percussion with normal breathing effort HEART: regular rate & rhythm and no murmurs and no lower extremity edema ABDOMEN:abdomen soft, non-tender and normal bowel sounds MUSCULOSKELETAL:no cyanosis of digits and no clubbing  NEURO: alert & oriented x 3 with fluent speech, no focal motor/sensory deficits EXTREMITIES: No lower extremity edema BREAST: No palpable masses or nodules in either right or left breasts. No palpable axillary supraclavicular or infraclavicular adenopathy no breast tenderness or nipple discharge. (exam performed in the presence of a chaperone)  LABORATORY DATA:  I have reviewed the data as listed   Chemistry      Component Value Date/Time   NA 143 10/25/2016 1211   K 4.7 10/25/2016 1211   CL 110 08/19/2016 0424   CL 100 07/30/2012 0939   CO2 28 10/25/2016 1211   BUN 13.4 10/25/2016 1211   CREATININE 0.8 10/25/2016 1211      Component Value Date/Time   CALCIUM 9.4 10/25/2016 1211   ALKPHOS 55 10/25/2016 1211   AST 18 10/25/2016 1211   ALT 21 10/25/2016 1211   BILITOT 0.41 10/25/2016 1211       Lab Results  Component Value Date   WBC 7.0 10/25/2016   HGB 12.2 10/25/2016   HCT 37.2 10/25/2016   MCV 94.7 10/25/2016   PLT 274 10/25/2016   NEUTROABS 4.5 10/25/2016    ASSESSMENT & PLAN:  Breast cancer of lower-outer quadrant of left female breast ([place]) Left breast invasive ductal carcinoma T3 N1 M0 status post mastectomy, Oncotype DX score 28 status post 4 cycles of Taxotere and Cytoxan followed by chest wall radiation and Arimidex 1 mg daily since March 2013  Arimidex toxicities: Denies any hot flashes or myalgias. I discussed the issue of duration of therapy. I discussed the results of MA 17 clinical trial supporting the extended adjuvant therapy for high-risk patients, patient wishes to stay on antiestrogen therapy for10  years..  Breast cancer surveillance: 1. Physical examination on the left breast and right chest wall and 10/25/2016 is normal 2. Mammogram left breast 04/29/2015 is normal 3. Bone density March 2015 T score of -2.1 and the femur osteopenia. I ordered another bone density test to be done in March 2017 to be done at [facility].  Osteopenia: Currently on Fosamax and calcium and vitamin D  Return to clinic in 1 year for follow-up    I spent 25 minutes talking to the patient of which more than half was spent in counseling and coordination of care.  No orders of the defined types were placed in this encounter.  The patient has a good understanding of the overall plan. she agrees with it. she will call with any problems that may develop before the next visit here.   Rulon Eisenmenger, MD 10/25/16

## 2016-11-27 DIAGNOSIS — M170 Bilateral primary osteoarthritis of knee: Secondary | ICD-10-CM | POA: Diagnosis not present

## 2016-11-27 DIAGNOSIS — M25562 Pain in left knee: Secondary | ICD-10-CM | POA: Diagnosis not present

## 2016-12-04 DIAGNOSIS — M25562 Pain in left knee: Secondary | ICD-10-CM | POA: Diagnosis not present

## 2016-12-04 DIAGNOSIS — M25569 Pain in unspecified knee: Secondary | ICD-10-CM | POA: Diagnosis not present

## 2016-12-04 DIAGNOSIS — M1120 Other chondrocalcinosis, unspecified site: Secondary | ICD-10-CM | POA: Diagnosis not present

## 2016-12-04 DIAGNOSIS — M170 Bilateral primary osteoarthritis of knee: Secondary | ICD-10-CM | POA: Diagnosis not present

## 2017-02-14 DIAGNOSIS — I10 Essential (primary) hypertension: Secondary | ICD-10-CM | POA: Diagnosis not present

## 2017-02-14 DIAGNOSIS — E119 Type 2 diabetes mellitus without complications: Secondary | ICD-10-CM | POA: Diagnosis not present

## 2017-02-21 DIAGNOSIS — J449 Chronic obstructive pulmonary disease, unspecified: Secondary | ICD-10-CM | POA: Diagnosis not present

## 2017-02-21 DIAGNOSIS — E785 Hyperlipidemia, unspecified: Secondary | ICD-10-CM | POA: Diagnosis not present

## 2017-02-21 DIAGNOSIS — E119 Type 2 diabetes mellitus without complications: Secondary | ICD-10-CM | POA: Diagnosis not present

## 2017-02-21 DIAGNOSIS — G4733 Obstructive sleep apnea (adult) (pediatric): Secondary | ICD-10-CM | POA: Diagnosis not present

## 2017-02-21 DIAGNOSIS — Z0000 Encounter for general adult medical examination without abnormal findings: Secondary | ICD-10-CM | POA: Diagnosis not present

## 2017-05-02 DIAGNOSIS — H2513 Age-related nuclear cataract, bilateral: Secondary | ICD-10-CM | POA: Diagnosis not present

## 2017-05-02 DIAGNOSIS — H40033 Anatomical narrow angle, bilateral: Secondary | ICD-10-CM | POA: Diagnosis not present

## 2017-05-08 DIAGNOSIS — Z853 Personal history of malignant neoplasm of breast: Secondary | ICD-10-CM | POA: Diagnosis not present

## 2017-05-08 DIAGNOSIS — Z1231 Encounter for screening mammogram for malignant neoplasm of breast: Secondary | ICD-10-CM | POA: Diagnosis not present

## 2017-06-07 DIAGNOSIS — M25562 Pain in left knee: Secondary | ICD-10-CM | POA: Diagnosis not present

## 2017-06-07 DIAGNOSIS — M1120 Other chondrocalcinosis, unspecified site: Secondary | ICD-10-CM | POA: Diagnosis not present

## 2017-06-07 DIAGNOSIS — M25569 Pain in unspecified knee: Secondary | ICD-10-CM | POA: Diagnosis not present

## 2017-06-07 DIAGNOSIS — M170 Bilateral primary osteoarthritis of knee: Secondary | ICD-10-CM | POA: Diagnosis not present

## 2017-06-18 DIAGNOSIS — Z23 Encounter for immunization: Secondary | ICD-10-CM | POA: Diagnosis not present

## 2017-07-20 DIAGNOSIS — E119 Type 2 diabetes mellitus without complications: Secondary | ICD-10-CM | POA: Diagnosis not present

## 2017-09-06 DIAGNOSIS — Z23 Encounter for immunization: Secondary | ICD-10-CM | POA: Diagnosis not present

## 2017-09-17 DIAGNOSIS — E785 Hyperlipidemia, unspecified: Secondary | ICD-10-CM | POA: Diagnosis not present

## 2017-09-17 DIAGNOSIS — I10 Essential (primary) hypertension: Secondary | ICD-10-CM | POA: Diagnosis not present

## 2017-09-17 DIAGNOSIS — R197 Diarrhea, unspecified: Secondary | ICD-10-CM | POA: Diagnosis not present

## 2017-09-17 DIAGNOSIS — E119 Type 2 diabetes mellitus without complications: Secondary | ICD-10-CM | POA: Diagnosis not present

## 2017-09-17 DIAGNOSIS — M810 Age-related osteoporosis without current pathological fracture: Secondary | ICD-10-CM | POA: Diagnosis not present

## 2017-10-14 DIAGNOSIS — N390 Urinary tract infection, site not specified: Secondary | ICD-10-CM | POA: Diagnosis not present

## 2017-10-14 DIAGNOSIS — E119 Type 2 diabetes mellitus without complications: Secondary | ICD-10-CM | POA: Diagnosis not present

## 2017-10-14 DIAGNOSIS — Z0000 Encounter for general adult medical examination without abnormal findings: Secondary | ICD-10-CM | POA: Diagnosis not present

## 2017-10-24 NOTE — Assessment & Plan Note (Signed)
Left breast invasive ductal carcinoma T3 N1 M0 status post mastectomy, Oncotype DX score 28 status post 4 cycles of Taxotere and Cytoxan followed by chest wall radiation and Arimidex 1 mg daily since March 2013  Arimidex toxicities:Denies any hot flashes or myalgias. I discussed the issue of duration of therapy. I discussed the results of MA 17 clinical trial supporting the extended adjuvant therapy for high-risk patients, patient wishes to stay on antiestrogen therapy for10 years..  Breast cancer surveillance: 1. Physical examination on the left breast and right chest wall 10/24/17 is normal 2. Mammogram left breast 04/29/2015 is normal 3. Bone density March 2015 T score of -2.1 and the femur osteopenia. I ordered another bone density test to be done in March 2017 to be done at [facility].  Osteopenia: Currently on Fosamax and calcium and vitamin D  Return to clinic in 1 year for follow-up

## 2017-10-25 ENCOUNTER — Telehealth: Payer: Self-pay | Admitting: Hematology and Oncology

## 2017-10-25 ENCOUNTER — Inpatient Hospital Stay: Payer: Medicare Other | Attending: Hematology and Oncology | Admitting: Hematology and Oncology

## 2017-10-25 VITALS — BP 143/49 | HR 85 | Temp 98.3°F | Resp 16 | Ht 60.0 in | Wt 174.4 lb

## 2017-10-25 DIAGNOSIS — C787 Secondary malignant neoplasm of liver and intrahepatic bile duct: Secondary | ICD-10-CM | POA: Insufficient documentation

## 2017-10-25 DIAGNOSIS — C50512 Malignant neoplasm of lower-outer quadrant of left female breast: Secondary | ICD-10-CM | POA: Diagnosis not present

## 2017-10-25 DIAGNOSIS — M8580 Other specified disorders of bone density and structure, unspecified site: Secondary | ICD-10-CM | POA: Insufficient documentation

## 2017-10-25 DIAGNOSIS — Z780 Asymptomatic menopausal state: Secondary | ICD-10-CM

## 2017-10-25 DIAGNOSIS — C7800 Secondary malignant neoplasm of unspecified lung: Secondary | ICD-10-CM | POA: Insufficient documentation

## 2017-10-25 DIAGNOSIS — Z79811 Long term (current) use of aromatase inhibitors: Secondary | ICD-10-CM | POA: Diagnosis not present

## 2017-10-25 DIAGNOSIS — Z170 Estrogen receptor positive status [ER+]: Secondary | ICD-10-CM

## 2017-10-25 DIAGNOSIS — C786 Secondary malignant neoplasm of retroperitoneum and peritoneum: Secondary | ICD-10-CM | POA: Insufficient documentation

## 2017-10-25 MED ORDER — ANASTROZOLE 1 MG PO TABS: 1.0000 mg | ORAL_TABLET | Freq: Every day | ORAL | 3 refills | Status: DC

## 2017-10-25 NOTE — Progress Notes (Signed)
Patient Care Team: [name], MD as PCP - General (Internal Medicine) [name], MD (Internal Medicine) Thea Silversmith, MD (Inactive) as Attending Physician (Radiation Oncology) Christene Slates, MD as Attending Physician (Radiology)  DIAGNOSIS:  Encounter Diagnosis  Name Primary?  . Malignant neoplasm of lower-outer quadrant of left breast of female, estrogen receptor positive ([place])     SUMMARY OF ONCOLOGIC HISTORY:   Breast cancer of lower-outer quadrant of left female breast ([place])   06/07/2011 Surgery     left mastectomy : multiple foci of invasive ductal carcinoma grade 2 5.1 cm and 1 cm with high-grade DCIS, lymphovascular invasion , 1/7 Oscar lymph node ER 100%, PR and her percent, Ki-67 50%, HER-2 equivocal ratio 2 , Oncotype 28      06/20/2011 - 09/11/2011 Chemotherapy    TC X 4      10/08/2011 - 11/13/2011 Radiation Therapy    chest wall radiation therapy       12/25/2011 -  Anti-estrogen oral therapy    Arimidex 1 mg daily        CHIEF COMPLIANT: Follow-up on Arimidex therapy  INTERVAL HISTORY: Christina Horn is a 72 year old with above-mentioned history of left breast cancer currently on adjuvant antiestrogen therapy with Arimidex.  She is tolerating it very well.  She does not have any hot flashes or myalgias.  She denies any lumps or nodules in the breast or chest wall.  She had prior left mastectomy.  REVIEW OF SYSTEMS:   Constitutional: Denies fevers, chills or abnormal weight loss Eyes: Denies blurriness of vision Ears, nose, mouth, throat, and face: Denies mucositis or sore throat Respiratory: Denies cough, dyspnea or wheezes Cardiovascular: Denies palpitation, chest discomfort Gastrointestinal:  Denies nausea, heartburn or change in bowel habits Skin: Denies abnormal skin rashes Lymphatics: Denies new lymphadenopathy or easy bruising Neurological:Denies numbness, tingling or new weaknesses Behavioral/Psych: Mood is stable, no new changes    Extremities: No lower extremity edema Breast: Left mastectomy All other systems were reviewed with the patient and are negative.  I have reviewed the past medical history, past surgical history, social history and family history with the patient and they are unchanged from previous note.  ALLERGIES:  has No Known Allergies.  MEDICATIONS:  Current Outpatient Medications  Medication Sig Dispense Refill  . alendronate (FOSAMAX) 70 MG tablet Take 1 tablet (70 mg total) by mouth once a week. Take with a full glass of water on an empty stomach. 12 tablet 3  . amLODipine (NORVASC) 5 MG tablet Take 5 mg by mouth daily.      Marland Kitchen anastrozole (ARIMIDEX) 1 MG tablet Take 1 tablet (1 mg total) by mouth daily. 90 tablet 3  . aspirin 81 MG tablet Take 81 mg by mouth daily.      . citalopram (CELEXA) 20 MG tablet Take 20 mg by mouth daily.      . fish oil-omega-3 fatty acids 1000 MG capsule Take 2 g by mouth daily.      . INCRUSE ELLIPTA 62.5 MCG/INH AEPB Inhale 1 puff into the lungs daily.    Marland Kitchen KLOR-CON M20 20 MEQ tablet Take 1 tablet by mouth daily.    Marland Kitchen losartan (COZAAR) 25 MG tablet     . oxybutynin (DITROPAN) 5 MG tablet daily.    Marland Kitchen VITAMIN D, CHOLECALCIFEROL, PO Take by mouth.       No current facility-administered medications for this visit.     PHYSICAL EXAMINATION: ECOG PERFORMANCE STATUS: 1 - Symptomatic but completely ambulatory  Vitals:   10/25/17 1221  BP: (!) 143/49  Pulse: 85  Resp: 16  Temp: 98.3 F (36.8 C)  SpO2: 95%   Filed Weights   10/25/17 1221  Weight: 174 lb 6.4 oz (79.1 kg)    GENERAL:alert, no distress and comfortable SKIN: skin color, texture, turgor are normal, no rashes or significant lesions EYES: normal, Conjunctiva are pink and non-injected, sclera clear OROPHARYNX:no exudate, no erythema and lips, buccal mucosa, and tongue normal  NECK: supple, thyroid normal size, non-tender, without nodularity LYMPH:  no palpable lymphadenopathy in the cervical,  axillary or inguinal LUNGS: clear to auscultation and percussion with normal breathing effort HEART: regular rate & rhythm and no murmurs and no lower extremity edema ABDOMEN:abdomen soft, non-tender and normal bowel sounds MUSCULOSKELETAL:no cyanosis of digits and no clubbing  NEURO: alert & oriented x 3 with fluent speech, no focal motor/sensory deficits EXTREMITIES: No lower extremity edema BREAST: No palpable lumps or nodules in the left chest wall.  No lumps or nodules in the right breast or axilla. (exam performed in the presence of a chaperone)  LABORATORY DATA:  I have reviewed the data as listed CMP Latest Ref Rng & Units 10/25/2016 08/19/2016 08/18/2016  Glucose 70 - 140 mg/dl 108 105(H) 111(H)  BUN 7.0 - 26.0 mg/dL 13._0 Creatinine 0.6 - 1.1 mg/dL 0.8 0.76 1.10(H)  Sodium 136 - 145 mEq/L 143 140 138  Potassium 3.5 - 5.1 mEq/L 4.7 3.6 3.9  Chloride 101 - 111 mmol/L - 110 106  CO2 22 - 29 mEq/L _1 Calcium 8.4 - 10.4 mg/dL 9.4 8.4(L) 9.0  Total Protein 6.4 - 8.3 g/dL 6.4 - 7.0  Total Bilirubin 0.20 - 1.20 mg/dL 0.41 - 0.4  Alkaline Phos 40 - 150 U/L 55 - 45  AST 5 - 34 U/L 18 - 25  ALT 0 - 55 U/L 21 - 26    Lab Results  Component Value Date   WBC 7.0 10/25/2016   HGB 12.2 10/25/2016   HCT 37.2 10/25/2016   MCV 94.7 10/25/2016   PLT 274 10/25/2016   NEUTROABS 4.5 10/25/2016    ASSESSMENT & PLAN:  Breast cancer of lower-outer quadrant of left female breast ([place]) Left breast invasive ductal carcinoma T3 N1 M0 status post mastectomy, Oncotype DX score 28 status post 4 cycles of Taxotere and Cytoxan followed by chest wall radiation and Arimidex 1 mg daily since March 2013  Arimidex toxicities:Denies any hot flashes or myalgias. I discussed the issue of duration of therapy. I discussed the results of MA 17 clinical trial supporting the extended adjuvant therapy for high-risk patients, patient wishes to stay on antiestrogen therapy for10 years..  Breast  cancer surveillance: 1. Physical examination on the left breast and right chest wall 10/24/17 is normal 2. Mammogram left breast July 2018 is normal 3. Bone density March 2015 T score of -2.1 and the femur osteopenia. I ordered another bone density test to be done in March 2017 to be done at [facility].  Osteopenia: Currently on Fosamax and calcium and vitamin D I recommended that she get a bone density at [facility] when she goes for her mammograms in July.  Return to clinic in 1 year for follow-up     I spent 25 minutes talking to the patient of which more than half was spent in counseling and coordination of care.  No orders of the defined types were placed in this encounter.  The patient has a good understanding of  the overall plan. she agrees with it. she will call with any problems that may develop before the next visit here.   Harriette Ohara, MD 10/25/17

## 2017-10-25 NOTE — Telephone Encounter (Signed)
Gave patient avs and calendar with appts per 11.1 los °

## 2017-11-01 DIAGNOSIS — R1013 Epigastric pain: Secondary | ICD-10-CM | POA: Diagnosis not present

## 2017-11-01 DIAGNOSIS — E119 Type 2 diabetes mellitus without complications: Secondary | ICD-10-CM | POA: Diagnosis not present

## 2017-11-01 DIAGNOSIS — N390 Urinary tract infection, site not specified: Secondary | ICD-10-CM | POA: Diagnosis not present

## 2017-11-01 DIAGNOSIS — I10 Essential (primary) hypertension: Secondary | ICD-10-CM | POA: Diagnosis not present

## 2017-11-01 DIAGNOSIS — R109 Unspecified abdominal pain: Secondary | ICD-10-CM | POA: Diagnosis not present

## 2017-11-04 ENCOUNTER — Other Ambulatory Visit: Payer: Self-pay | Admitting: Internal Medicine

## 2017-11-04 DIAGNOSIS — R1013 Epigastric pain: Secondary | ICD-10-CM

## 2017-11-12 ENCOUNTER — Ambulatory Visit
Admission: RE | Admit: 2017-11-12 | Discharge: 2017-11-12 | Disposition: A | Discharge status: Home / Self Care | Payer: Medicare Other | Source: Ambulatory Visit | Attending: Internal Medicine | Admitting: Internal Medicine

## 2017-11-12 DIAGNOSIS — K7689 Other specified diseases of liver: Secondary | ICD-10-CM | POA: Diagnosis not present

## 2017-11-12 DIAGNOSIS — R1013 Epigastric pain: Secondary | ICD-10-CM

## 2017-11-12 MED ORDER — IOPAMIDOL (ISOVUE-300) INJECTION 61%: 100.0000 mL | Freq: Once | INTRAVENOUS | Status: DC | PRN

## 2017-11-13 ENCOUNTER — Telehealth: Payer: Self-pay | Admitting: *Deleted

## 2017-11-13 NOTE — Telephone Encounter (Signed)
Patient will get expedited appointment tomorrow on my schedule and then see Dr. Lindi Adie.  Patient was extremely appreciative of this, because she was worried about having to wait until 2/11 and the stress that the 11 days would cause.  She will be here tomorrow at 130 PM.

## 2017-11-13 NOTE — Telephone Encounter (Signed)
'  [name] with Dr. Julianne Rice office calling to schedule appointment with Dr. Lindi Adie.  Patient C/O gastric pain.  CT abdomen/pelvis performed yesterday reveals need for oncology appointment."  Assured Christina Horn this information to provider for scheduling.

## 2017-11-14 ENCOUNTER — Encounter: Payer: Self-pay | Admitting: Adult Health

## 2017-11-14 ENCOUNTER — Inpatient Hospital Stay (HOSPITAL_BASED_OUTPATIENT_CLINIC_OR_DEPARTMENT_OTHER): Payer: Medicare Other | Admitting: Adult Health

## 2017-11-14 VITALS — BP 138/55 | HR 97 | Temp 99.1°F | Resp 18 | Ht 60.0 in | Wt 173.3 lb

## 2017-11-14 DIAGNOSIS — Z170 Estrogen receptor positive status [ER+]: Secondary | ICD-10-CM

## 2017-11-14 DIAGNOSIS — C50512 Malignant neoplasm of lower-outer quadrant of left female breast: Secondary | ICD-10-CM | POA: Diagnosis not present

## 2017-11-14 DIAGNOSIS — C787 Secondary malignant neoplasm of liver and intrahepatic bile duct: Secondary | ICD-10-CM | POA: Insufficient documentation

## 2017-11-14 DIAGNOSIS — C7800 Secondary malignant neoplasm of unspecified lung: Secondary | ICD-10-CM | POA: Insufficient documentation

## 2017-11-14 DIAGNOSIS — Z79811 Long term (current) use of aromatase inhibitors: Secondary | ICD-10-CM

## 2017-11-14 DIAGNOSIS — M8580 Other specified disorders of bone density and structure, unspecified site: Secondary | ICD-10-CM | POA: Diagnosis not present

## 2017-11-14 DIAGNOSIS — C786 Secondary malignant neoplasm of retroperitoneum and peritoneum: Secondary | ICD-10-CM | POA: Diagnosis not present

## 2017-11-14 NOTE — Progress Notes (Addendum)
[place] [facility] Follow up:    Christina Horn, [place] Christina Horn   DIAGNOSIS: Cancer Staging No matching staging information was found for the patient.  SUMMARY OF ONCOLOGIC HISTORY:   Breast cancer of lower-outer quadrant of left female breast ([place])   06/07/2011 Surgery     left mastectomy : multiple foci of invasive ductal carcinoma grade 2 5.1 cm and 1 cm with high-grade DCIS, lymphovascular invasion , 1/7 Oscar lymph node ER 100%, PR and her percent, Ki-67 50%, HER-2 equivocal ratio 2 , Oncotype 28      06/20/2011 - 09/11/2011 Chemotherapy    TC X 4      10/08/2011 - 11/13/2011 Radiation Therapy    chest wall radiation therapy       12/25/2011 -  Anti-estrogen oral therapy    Arimidex 1 mg daily        CURRENT THERAPY: Anastrozole daily  INTERVAL HISTORY: Christina Horn 72 y.o. female returns for evaluation of new metastases in her liver, lung and retropretoneal lymph node as seen on CT scan done 2 days ago by Dr. Maudie Mercury.  Christina Horn noted about a months worth of intermittent upper abdominal discomfort, and a scan found these cancerous lesions.  A thrombus was also noted a thrombus in the left portal vein and potential central hepatic vein.  She was started on Xarelto 16m PO BID.  She is tolerating that well.  She notes no progressive weight loss, nausea, diarrhea or any other symptoms.  She is accompanied today by her next of kin and her son, Christina Horn  [name], is completely self sufficient in taking care of herself, and her household, and her son lives next door.     Patient Active Problem List   Diagnosis Date Noted  . Liver metastasis ([facility] 11/14/2017  . Lung metastases ([facility] 11/14/2017  . Depression with anxiety 08/18/2016  . Dyspnea 08/18/2016  . Non-insulin dependent type 2 diabetes mellitus ([facility] 08/18/2016  . Hypertension 08/18/2016  . OSA on CPAP 08/18/2016  . Kidney disease 08/18/2016  . SOB (shortness of  breath) 08/18/2016  . COPD (chronic obstructive pulmonary disease) ([facility] 08/18/2016  . Elevated troponin 08/18/2016  . Osteopenia 10/27/2015  . Breast cancer of lower-outer quadrant of left female breast ([facility] 05/09/2011    has No Known Allergies.  MEDICAL HISTORY: Past Medical History:  Diagnosis Date  . Anxiety    h/o anxiety attack  . Arthritis    back meds not required  . Breast cancer ([facility]    left breast  . Breast cancer, left breast ([facility] 05/09/2011  . COPD (chronic obstructive pulmonary disease) ([facility] [place]   . Diabetes mellitus    borderline  . Hypertension   . Pneumonia   . Sleep apnea    Uses CPAP    SURGICAL HISTORY: Past Surgical History:  Procedure Laterality Date  . ABDOMINAL HYSTERECTOMY    . BREAST SURGERY    . CHOLECYSTECTOMY    . left leg vein striping    . MASTECTOMY  06/07/11   left   . TRACHEOSTOMY    . VASCULAR SURGERY      SOCIAL HISTORY: Social History   Socioeconomic History  . Marital status: Widowed    Spouse name: Not on file  . Number of children: Not on file  . Years of education: Not on file  . Highest education level: Not on file  Social Needs  . Financial resource strain: Not on  file  . Food insecurity - worry: Not on file  . Food insecurity - inability: Not on file  . Transportation needs - medical: Not on file  . Transportation needs - non-medical: Not on file  Occupational History  . Not on file  Tobacco Use  . Smoking status: Former Smoker    Packs/day: 1.50    Years: 25.00    Pack years: 37.50    Types: Cigarettes    Last attempt to quit: 10/19/2001    Years since quitting: 16.0  . Smokeless tobacco: Never Used  Substance and Sexual Activity  . Alcohol use: No  . Drug use: No  . Sexual activity: Not Currently  Other Topics Concern  . Not on file  Social History Narrative  . Not on file    FAMILY HISTORY: Family History  Problem Relation Age of Onset  . Cancer Mother        breast  . Cancer Sister         lung  . Cancer Maternal Aunt        lung  . Cancer Maternal Grandmother        unknown  . Hypertension Sister     Review of Systems  Constitutional: Negative for appetite change, chills, diaphoresis, fatigue and fever.  HENT:   Negative for hearing loss and lump/mass.   Eyes: Negative for eye problems and icterus.  Respiratory: Negative for chest tightness, cough and shortness of breath.   Cardiovascular: Negative for chest pain, leg swelling and palpitations.  Gastrointestinal: Negative for abdominal distention, abdominal pain, constipation, diarrhea, nausea and vomiting.  Endocrine: Negative for hot flashes.  Genitourinary: Negative for difficulty urinating.   Musculoskeletal: Negative for arthralgias.  Skin: Negative for itching and rash.  Neurological: Negative for dizziness, extremity weakness, headaches and numbness.  Hematological: Negative for adenopathy. Does not bruise/bleed easily.  Psychiatric/Behavioral: Negative for depression. The patient is not nervous/anxious.       PHYSICAL EXAMINATION  ECOG PERFORMANCE STATUS: 0 - Asymptomatic  Vitals:   11/14/17 1325  BP: (!) 138/55  Pulse: 97  Resp: 18  Temp: 99.1 F (37.3 C)  SpO2: 96%    Physical Exam  Constitutional: She is oriented to person, place, and time and well-developed, well-nourished, and in no distress.  HENT:  Head: Normocephalic and atraumatic.  Mouth/Throat: Oropharynx is clear and moist. No oropharyngeal exudate.  Eyes: Pupils are equal, round, and reactive to light. No scleral icterus.  Neck: Neck supple.  Cardiovascular: Normal rate, regular rhythm and normal heart sounds.  Pulmonary/Chest: Effort normal and breath sounds normal. No respiratory distress.  Abdominal: Soft. Bowel sounds are normal. She exhibits distension and mass (liver is palpable in right upper quadrant). There is tenderness.  Musculoskeletal: She exhibits no edema.  Lymphadenopathy:    She has no cervical adenopathy.   Neurological: She is alert and oriented to person, place, and time.  Skin: Skin is warm and dry. No rash noted. No erythema.  Psychiatric: Mood and affect normal.    LABORATORY DATA:  CBC    Component Value Date/Time   WBC 7.0 10/25/2016 1211   WBC 10.0 08/18/2016 1652   RBC 3.93 10/25/2016 1211   RBC 3.96 08/18/2016 1652   HGB 12.2 10/25/2016 1211   HCT 37.2 10/25/2016 1211   PLT 274 10/25/2016 1211   MCV 94.7 10/25/2016 1211   MCH 31.1 10/25/2016 1211   MCH 31.6 08/18/2016 1652   MCHC 32.9 10/25/2016 1211   MCHC 33.0 08/18/2016 1652  RDW 13.3 10/25/2016 1211   LYMPHSABS 1.7 10/25/2016 1211   MONOABS 0.6 10/25/2016 1211   EOSABS 0.2 10/25/2016 1211   BASOSABS 0.1 10/25/2016 1211    CMP     Component Value Date/Time   NA 143 10/25/2016 1211   K 4.7 10/25/2016 1211   CL 110 08/19/2016 0424   CL 100 07/30/2012 0939   CO2 28 10/25/2016 1211   GLUCOSE 108 10/25/2016 1211   GLUCOSE 167 (H) 07/30/2012 0939   BUN 13.4 10/25/2016 1211   CREATININE 0.8 10/25/2016 1211   CALCIUM 9.4 10/25/2016 1211   PROT 6.4 10/25/2016 1211   ALBUMIN 3.4 (L) 10/25/2016 1211   AST 18 10/25/2016 1211   ALT 21 10/25/2016 1211   ALKPHOS 55 10/25/2016 1211   BILITOT 0.41 10/25/2016 1211   GFRNONAA >60 08/19/2016 0424   GFRAA >60 08/19/2016 0424     ASSESSMENT and PLAN:   Breast cancer of lower-outer quadrant of left female breast ([place]) Left breast invasive ductal carcinoma T3 N1 M0 status post mastectomy, Oncotype DX score 28 status post 4 cycles of Taxotere and Cytoxan followed by chest wall radiation and Arimidex 1 mg daily since March 2013  New lesions on CT A/P show liver, lower lung, and retroperitoneal lesions  Plan is for biopsy of the left retroperitoneal by CT. Dr. Lindi Adie met with the patient and reviewed this with her.  He also reviewed with Dr. Hilma Favors.  Patient to f/u with her PCP tomorrow.        Orders Placed This Encounter  Procedures  . CT Biopsy    CT guided  left retroperitoneal biopsy per Dr. Caryl Bis with Dr. Lindi Adie on 11/14/17 after reviewing patient CT abd/pelvis data    Standing Status:   Future    Standing Expiration Date:   11/14/2018    Order Specific Question:   Lab orders requested (DO NOT place separate lab orders, these will be automatically ordered during procedure specimen collection):    Answer:   Surgical Pathology    Order Specific Question:   Reason for Exam (SYMPTOM  OR DIAGNOSIS REQUIRED)    Answer:   metastatic cancer on scans, biopsying retroperiotneal lymph node, on left per discussion with Dr. Hilma Favors and Dr. Lindi Adie    Order Specific Question:   Preferred imaging location?    Answer:   [facility]    Order Specific Question:   Call Results- Best Contact Number?    Answer:   Dr. Lindi Adie    Order Specific Question:   Radiology Contrast Protocol - do NOT remove file path    Answer:   [email or web address]    All questions were answered. The patient knows to call the clinic with any problems, questions or concerns. We can certainly see the patient much sooner if necessary.  A total of (30) minutes of face-to-face time was spent with this patient with greater than 50% of that time in counseling and care-coordination.  This note was electronically signed. Scot Dock, NP 11/14/2017   Attending Note  I personally saw and examined Christina Horn. The plan of care was discussed with her. I agree with the assessment and plan as documented above. I reviewed the CT scans with the patient.  She does have extensive disease involving lungs, liver and retroperitoneal lymph node.  I called and discussed the case with Dr. Reesa Chew who is an interventional radiologist.  He recommended that we put in a request for CT-guided biopsy of the retroperitoneal lymph  node. I would like to see her a few days after she undergoes his biopsy to go over the treatment options.  Signed Harriette Ohara, MD

## 2017-11-14 NOTE — Assessment & Plan Note (Signed)
Left breast invasive ductal carcinoma T3 N1 M0 status post mastectomy, Oncotype DX score 28 status post 4 cycles of Taxotere and Cytoxan followed by chest wall radiation and Arimidex 1 mg daily since March 2013  New lesions on CT A/P show liver, lower lung, and retroperitoneal lesions  Plan is for biopsy of the left retroperitoneal by CT. Dr. Lindi Adie met with the patient and reviewed this with her.  He also reviewed with Dr. Hilma Favors.  Patient to f/u with her PCP tomorrow.

## 2017-11-15 DIAGNOSIS — C787 Secondary malignant neoplasm of liver and intrahepatic bile duct: Secondary | ICD-10-CM | POA: Diagnosis not present

## 2017-11-15 DIAGNOSIS — M810 Age-related osteoporosis without current pathological fracture: Secondary | ICD-10-CM | POA: Diagnosis not present

## 2017-11-15 DIAGNOSIS — I10 Essential (primary) hypertension: Secondary | ICD-10-CM | POA: Diagnosis not present

## 2017-11-19 ENCOUNTER — Other Ambulatory Visit: Payer: Self-pay | Admitting: Radiology

## 2017-11-20 ENCOUNTER — Encounter: Payer: Self-pay | Admitting: Adult Health

## 2017-11-20 ENCOUNTER — Ambulatory Visit (HOSPITAL_COMMUNITY)
Admission: RE | Admit: 2017-11-20 | Discharge: 2017-11-20 | Disposition: A | Discharge status: Home / Self Care | Payer: Medicare Other | Source: Ambulatory Visit | Attending: Hematology and Oncology | Admitting: Hematology and Oncology

## 2017-11-20 ENCOUNTER — Ambulatory Visit (HOSPITAL_COMMUNITY)
Admission: RE | Admit: 2017-11-20 | Discharge: 2017-11-20 | Disposition: A | Discharge status: Home / Self Care | Payer: Medicare Other | Source: Ambulatory Visit | Attending: Adult Health | Admitting: Adult Health

## 2017-11-20 ENCOUNTER — Ambulatory Visit: Payer: Self-pay

## 2017-11-20 ENCOUNTER — Encounter (HOSPITAL_COMMUNITY): Payer: Self-pay

## 2017-11-20 ENCOUNTER — Inpatient Hospital Stay: Payer: Medicare Other | Attending: Hematology and Oncology | Admitting: Adult Health

## 2017-11-20 VITALS — BP 135/58 | HR 94 | Temp 98.7°F | Resp 18 | Ht 60.0 in | Wt 171.7 lb

## 2017-11-20 DIAGNOSIS — C787 Secondary malignant neoplasm of liver and intrahepatic bile duct: Secondary | ICD-10-CM

## 2017-11-20 DIAGNOSIS — Z801 Family history of malignant neoplasm of trachea, bronchus and lung: Secondary | ICD-10-CM | POA: Diagnosis not present

## 2017-11-20 DIAGNOSIS — Z803 Family history of malignant neoplasm of breast: Secondary | ICD-10-CM | POA: Insufficient documentation

## 2017-11-20 DIAGNOSIS — Z170 Estrogen receptor positive status [ER+]: Secondary | ICD-10-CM

## 2017-11-20 DIAGNOSIS — F419 Anxiety disorder, unspecified: Secondary | ICD-10-CM | POA: Insufficient documentation

## 2017-11-20 DIAGNOSIS — Z853 Personal history of malignant neoplasm of breast: Secondary | ICD-10-CM | POA: Insufficient documentation

## 2017-11-20 DIAGNOSIS — R160 Hepatomegaly, not elsewhere classified: Secondary | ICD-10-CM | POA: Insufficient documentation

## 2017-11-20 DIAGNOSIS — C7800 Secondary malignant neoplasm of unspecified lung: Secondary | ICD-10-CM

## 2017-11-20 DIAGNOSIS — Z7982 Long term (current) use of aspirin: Secondary | ICD-10-CM | POA: Diagnosis not present

## 2017-11-20 DIAGNOSIS — R188 Other ascites: Secondary | ICD-10-CM | POA: Insufficient documentation

## 2017-11-20 DIAGNOSIS — Z79811 Long term (current) use of aromatase inhibitors: Secondary | ICD-10-CM | POA: Insufficient documentation

## 2017-11-20 DIAGNOSIS — I10 Essential (primary) hypertension: Secondary | ICD-10-CM | POA: Insufficient documentation

## 2017-11-20 DIAGNOSIS — Z79899 Other long term (current) drug therapy: Secondary | ICD-10-CM | POA: Diagnosis not present

## 2017-11-20 DIAGNOSIS — Z923 Personal history of irradiation: Secondary | ICD-10-CM | POA: Diagnosis not present

## 2017-11-20 DIAGNOSIS — Z87891 Personal history of nicotine dependence: Secondary | ICD-10-CM | POA: Diagnosis not present

## 2017-11-20 DIAGNOSIS — Z9221 Personal history of antineoplastic chemotherapy: Secondary | ICD-10-CM | POA: Insufficient documentation

## 2017-11-20 DIAGNOSIS — C50512 Malignant neoplasm of lower-outer quadrant of left female breast: Secondary | ICD-10-CM | POA: Diagnosis not present

## 2017-11-20 DIAGNOSIS — J449 Chronic obstructive pulmonary disease, unspecified: Secondary | ICD-10-CM | POA: Insufficient documentation

## 2017-11-20 DIAGNOSIS — Z9989 Dependence on other enabling machines and devices: Secondary | ICD-10-CM | POA: Insufficient documentation

## 2017-11-20 DIAGNOSIS — Z5111 Encounter for antineoplastic chemotherapy: Secondary | ICD-10-CM | POA: Diagnosis not present

## 2017-11-20 DIAGNOSIS — R590 Localized enlarged lymph nodes: Secondary | ICD-10-CM | POA: Diagnosis not present

## 2017-11-20 DIAGNOSIS — G4730 Sleep apnea, unspecified: Secondary | ICD-10-CM | POA: Diagnosis not present

## 2017-11-20 DIAGNOSIS — C801 Malignant (primary) neoplasm, unspecified: Secondary | ICD-10-CM | POA: Diagnosis not present

## 2017-11-20 DIAGNOSIS — Z9012 Acquired absence of left breast and nipple: Secondary | ICD-10-CM | POA: Insufficient documentation

## 2017-11-20 DIAGNOSIS — C772 Secondary and unspecified malignant neoplasm of intra-abdominal lymph nodes: Secondary | ICD-10-CM | POA: Insufficient documentation

## 2017-11-20 LAB — GLUCOSE, CAPILLARY: Glucose-Capillary: 94 mg/dL (ref 65–99)

## 2017-11-20 LAB — CBC WITH DIFFERENTIAL/PLATELET
BASOS PCT: 0 %
Basophils Absolute: 0 10*3/uL (ref 0.0–0.1)
EOS ABS: 0.2 10*3/uL (ref 0.0–0.7)
EOS PCT: 2 %
HCT: 39.7 % (ref 36.0–46.0)
Hemoglobin: 12.9 g/dL (ref 12.0–15.0)
Lymphocytes Relative: 15 %
Lymphs Abs: 1.7 10*3/uL (ref 0.7–4.0)
MCH: 30.4 pg (ref 26.0–34.0)
MCHC: 32.5 g/dL (ref 30.0–36.0)
MCV: 93.4 fL (ref 78.0–100.0)
MONO ABS: 1 10*3/uL (ref 0.1–1.0)
MONOS PCT: 9 %
Neutro Abs: 8.2 10*3/uL — ABNORMAL HIGH (ref 1.7–7.7)
Neutrophils Relative %: 74 %
Platelets: 278 10*3/uL (ref 150–400)
RBC: 4.25 MIL/uL (ref 3.87–5.11)
RDW: 13.3 % (ref 11.5–15.5)
WBC: 11.1 10*3/uL — ABNORMAL HIGH (ref 4.0–10.5)

## 2017-11-20 LAB — PROTIME-INR
INR: 1.05
PROTHROMBIN TIME: 13.6 s (ref 11.4–15.2)

## 2017-11-20 LAB — BASIC METABOLIC PANEL
Anion gap: 8 (ref 5–15)
BUN: 14 mg/dL (ref 6–20)
CALCIUM: 13.1 mg/dL — AB (ref 8.9–10.3)
CO2: 27 mmol/L (ref 22–32)
CREATININE: 0.74 mg/dL (ref 0.44–1.00)
Chloride: 103 mmol/L (ref 101–111)
GFR calc Af Amer: >60 mL/min (ref >60)
GFR calc non Af Amer: >60 mL/min (ref >60)
GLUCOSE: 108 mg/dL — AB (ref 65–99)
Potassium: 4.3 mmol/L (ref 3.5–5.1)
Sodium: 138 mmol/L (ref 135–145)

## 2017-11-20 MED ORDER — FENTANYL CITRATE (PF) 100 MCG/2ML IJ SOLN
INTRAMUSCULAR | Status: AC
  Filled 2017-11-20: qty 2

## 2017-11-20 MED ORDER — LIDOCAINE HCL (PF) 1 % IJ SOLN
INTRAMUSCULAR | Status: AC | PRN
  Administered 2017-11-20: 30 mL

## 2017-11-20 MED ORDER — ZOLEDRONIC ACID 4 MG/100ML IV SOLN
4.0000 mg | Freq: Once | INTRAVENOUS | Status: AC
  Administered 2017-11-20: 4 mg via INTRAVENOUS
  Filled 2017-11-20: qty 100

## 2017-11-20 MED ORDER — SODIUM CHLORIDE 0.9 % IV SOLN
Freq: Once | INTRAVENOUS | Status: AC
  Administered 2017-11-20: 10:00:00 via INTRAVENOUS

## 2017-11-20 MED ORDER — MIDAZOLAM HCL 2 MG/2ML IJ SOLN
INTRAMUSCULAR | Status: AC | PRN
  Administered 2017-11-20: 1 mg via INTRAVENOUS

## 2017-11-20 MED ORDER — FENTANYL CITRATE (PF) 100 MCG/2ML IJ SOLN
INTRAMUSCULAR | Status: AC | PRN
  Administered 2017-11-20: 50 ug via INTRAVENOUS

## 2017-11-20 MED ORDER — MIDAZOLAM HCL 2 MG/2ML IJ SOLN
INTRAMUSCULAR | Status: AC
  Filled 2017-11-20: qty 2

## 2017-11-20 MED ORDER — SODIUM CHLORIDE 0.9 % IV SOLN
INTRAVENOUS | Status: DC
  Administered 2017-11-20: 07:00:00 via INTRAVENOUS

## 2017-11-20 NOTE — Assessment & Plan Note (Addendum)
Left breast invasive ductal carcinoma T3 N1 M0 status post mastectomy, Oncotype DX score 28 status post 4 cycles of Taxotere and Cytoxan followed by chest wall radiation and Arimidex 1 mg daily since March 2013  New lesions on CT A/P show liver, lower lung, and retroperitoneal lesions  Patient underwent biopsy today and was incidentally noted to have a calcium level of 13.1.  Christina Horn let her receive 2 liters of IV fluids while she was undergoing biopsy.  She still has her IV.  She will receive IV Zometa today.  She will return on Monday for labs and f/u with Dr. Lindi Horn to review her biopsy results and treatment options.

## 2017-11-20 NOTE — Patient Instructions (Signed)

## 2017-11-20 NOTE — Consult Note (Signed)
Chief Complaint: Patient was seen in consultation today for CT-guided left retroperitoneal lymph node biopsy   Referring Physician(s): Causey,Lindsey Cornetto/Gudena,V  Supervising Physician: Jacqulynn Cadet  Patient Status: [facility] - Out-pt  History of Present Illness: Christina Horn is a 72 y.o. female with history of left breast carcinoma 2012, status post surgery and chemoradiation.  Recent follow-up imaging has revealed mass lesion in the left portal vein concerning for [facility] versus hepatic metastasis as well as tumor thrombus in the left portal vein, nodal metastasis to the periaortic retroperitoneum and thoracic metastasis to the lung bases and pericardial fat.  She presents today for CT-guided left retroperitoneal lymph node biopsy for further evaluation.  Past Medical History:  Diagnosis Date  . Anxiety    h/o anxiety attack  . Arthritis    back meds not required  . Breast cancer ([place])    left breast  . Breast cancer, left breast ([place]) 05/09/2011  . COPD (chronic obstructive pulmonary disease) ([place])   . Diabetes mellitus    borderline  . Hypertension   . Pneumonia   . Sleep apnea    Uses CPAP    Past Surgical History:  Procedure Laterality Date  . ABDOMINAL HYSTERECTOMY    . BREAST SURGERY    . CHOLECYSTECTOMY    . left leg vein striping    . MASTECTOMY  06/07/11   left   . TRACHEOSTOMY    . VASCULAR SURGERY      Allergies: Patient has no known allergies.  Medications: Prior to Admission medications   Medication Sig Start Date End Date Taking? Authorizing Provider  amLODipine (NORVASC) 5 MG tablet Take 5 mg by mouth daily.     Yes [provider]  anastrozole (ARIMIDEX) 1 MG tablet Take 1 tablet (1 mg total) by mouth daily. 10/25/17  Yes Nicholas Lose, MD  citalopram (CELEXA) 20 MG tablet Take 20 mg by mouth daily.     Yes [provider]  fish oil-omega-3 fatty acids 1000 MG capsule Take 2 g by mouth daily.     Yes [provider]  INCRUSE ELLIPTA 62.5 MCG/INH AEPB Inhale 1 puff into the lungs daily. 08/08/16  Yes [provider]  KLOR-CON M20 20 MEQ tablet Take 1 tablet by mouth daily. 05/30/16  Yes [provider]  losartan (COZAAR) 25 MG tablet  02/05/13  Yes [provider]  oxybutynin (DITROPAN) 5 MG tablet daily. 12/05/13  Yes [provider]  VITAMIN D, CHOLECALCIFEROL, PO Take by mouth.     Yes [provider]  aspirin 81 MG tablet Take 81 mg by mouth daily.      [provider]  Dollene Primrose PACK 15 & 20 MG TBPK Take 15 mg by mouth 2 (two) times daily. 11/13/17   [provider]     Family History  Problem Relation Age of Onset  . Cancer Mother        breast  . Cancer Sister        lung  . Cancer Maternal Aunt        lung  . Cancer Maternal Grandmother        unknown  . Hypertension Sister     Social History   Socioeconomic History  . Marital status: Widowed    Spouse name: None  . Number of children: None  . Years of education: None  . Highest education level: None  Social Needs  . Financial resource strain: None  . Food insecurity -  worry: None  . Food insecurity - inability: None  . Transportation needs - medical: None  . Transportation needs - non-medical: None  Occupational History  . None  Tobacco Use  . Smoking status: Former Smoker    Packs/day: 1.50    Years: 25.00    Pack years: 37.50    Types: Cigarettes    Last attempt to quit: 10/19/2001    Years since quitting: 16.0  . Smokeless tobacco: Never Used  Substance and Sexual Activity  . Alcohol use: No  . Drug use: No  . Sexual activity: Not Currently  Other Topics Concern  . None  Social History Narrative  . None      Review of Systems currently denies fever, headache, chest pain, dyspnea, cough, back pain, nausea, vomiting or bleeding.  She has had some intermittent abdominal pain.  Vital Signs: Blood pressure 170/83, heart rate 97, temp  98.4, respirations 18, O2 sat 97% room air    Physical Exam awake, alert.  Chest with distant breath sounds bilaterally.  Heart with regular rate and rhythm.  Abdomen obese, dist, soft, positive bowel sounds, mildly tender right upper quadrant/epigastric region; LE with FROM  Imaging: Ct Abdomen Pelvis W Contrast  Result Date: 11/12/2017 CLINICAL DATA:  Epigastric pain for 1 month. Breast cancer diagnosed 2012. Postcholecystectomy hysterectomy. EXAM: CT ABDOMEN AND PELVIS WITH CONTRAST TECHNIQUE: Multidetector CT imaging of the abdomen and pelvis was performed using the standard protocol following bolus administration of intravenous contrast. CONTRAST:  100 cc Isovue Creatinine was obtained on site at [place] [facility] at [address] Results: Creatinine 0.7 mg/dL. COMPARISON:  None. FINDINGS: Lower chest: 4.0 cm mass at the LEFT lung base. Smaller nodule at the RIGHT lung base measures 0.7 cm. Lesion within the pericardial fat adjacent to the RIGHT atrium measures 2.5 cm (image 5, series 3) Hepatobiliary: Large lesion expanding the caudate lobe measures 6.7 x 8.4 cm. This lesion is infiltrative and extends into the more superior aspect of the central LEFT hepatic lobe. There is thrombus within the LEFT portal vein. Thrombus extends to the bifurcation of portal veins (image 26, series 3). Additionally there is evidence of potential invasion of the hepatic veins with a filling defect within the IVC (image 9, series 3) Pancreas: Pancreas is normal. No ductal dilatation. No pancreatic inflammation. Spleen: Normal spleen Adrenals/urinary tract: Adrenal glands and kidneys are normal. The ureters and bladder normal. Stomach/Bowel: Mild pelvicaliectasis of the LEFT renal collecting system. Nonobstructing calculus in LEFT kidney. Distal ureters normal. Bladder normal. Vascular/Lymphatic: Calcification of the abdominal aorta. Irregular lymph node adjacent to the aorta just below the superior mesenteric  artery measures 3.2 by 2.1 cm Reproductive: Post hysterectomy Other: No peritoneal metastasis small bowel hernia Musculoskeletal: No aggressive osseous lesion. IMPRESSION: 1. Mass lesion in the central LEFT hepatic lobe with extension into the LEFT portal vein is concerning for hepatocellular carcinoma versus hepatic metastasis. 2. Tumor thrombus in the LEFT portal vein and potential central hepatic vein. 3. Nodal metastasis to the periaortic retroperitoneum. 4. Thoracic metastasis to the lung bases and pericardial fat. These results will be called to the ordering clinician or representative by the Radiologist Assistant, and communication documented in the PACS or zVision Dashboard. Electronically Signed   By: Suzy Bouchard M.D.   On: 11/12/2017 17:27    Labs:  CBC: Recent Labs    11/20/17 0713  WBC 11.1*  HGB 12.9  HCT 39.7  PLT 278    COAGS: Recent Labs  11/20/17 0713  INR 1.05    BMP: Recent Labs    11/20/17 0713  NA 138  K 4.3  CL 103  CO2 27  GLUCOSE 108*  BUN 14  CALCIUM 13.1*  CREATININE 0.74  GFRNONAA >60  GFRAA >60    LIVER FUNCTION TESTS: No results for input(s): BILITOT, AST, ALT, ALKPHOS, PROT, ALBUMIN in the last 8760 hours.  TUMOR MARKERS: No results for input(s): AFPTM, CEA, CA199, CHROMGRNA in the last 8760 hours.  Assessment and Plan: 72 y.o. female with history of left breast carcinoma 2012, status post surgery and chemoradiation.  Recent follow-up imaging has revealed mass lesion in the left portal vein concerning for [facility] versus hepatic metastasis as well as tumor thrombus in the left portal vein, nodal metastasis to the periaortic retroperitoneum and thoracic metastasis to the lung bases and pericardial fat.  She presents today for CT-guided left retroperitoneal lymph node biopsy for further evaluation.Risks and benefits discussed with the patient/son including, but not limited to bleeding, infection, damage to adjacent structures or low yield  requiring additional tests.All of the patient's questions were answered, patient is agreeable to proceed.Consent signed and in chart. Patient's calcium today elevated at 13.1- oncology service notified.    Thank you for this interesting consult.  I greatly enjoyed meeting Christina Horn and look forward to participating in their care.  A copy of this report was sent to the requesting provider on this date.  Electronically Signed: D. Rowe Robert, PA-C 11/20/2017, 8:21 AM   I spent a total of 25 minutes    in face to face in clinical consultation, greater than 50% of which was counseling/coordinating care for CT-guided left retroperitoneal lymph node biopsy

## 2017-11-20 NOTE — Procedures (Signed)
Interventional Radiology Procedure Note  Procedure: CT guided biopsy of left retroperitoneal lymph node  Complications: None  Estimated Blood Loss: None  Recommendations: - Bedrest x 2 hrs - DC home - Path pending - Hydrating with 1L NS for hypercalcemia  Signed,  Criselda Peaches, MD

## 2017-11-20 NOTE — Progress Notes (Signed)
Called infusion to add patient for zometa and they could not take her until 2pm. Called symptom management and they were full as well. Patient stayed in room on Breast Side and received Zometa from collaborative RN, myself. Patient had IV in right hand, flushed well with blood return. This IV was used for infusion. Patient tolerated well. After infusion IV was removed. AVS given. No further questions at this time.  Cyndia Bent RN

## 2017-11-20 NOTE — Progress Notes (Addendum)
[facility]:    Christina Horn, [place] [address]   DIAGNOSIS: Cancer Staging No matching staging information was found for the patient.  SUMMARY OF ONCOLOGIC HISTORY:   Breast cancer of lower-outer quadrant of left female breast (Christina Horn)   06/07/2011 Surgery     left mastectomy : multiple foci of invasive ductal carcinoma grade 2 5.1 cm and 1 cm with high-grade DCIS, lymphovascular invasion , 1/7 Christina Horn lymph node ER 100%, PR and her percent, Ki-67 50%, HER-2 equivocal ratio 2 , Oncotype 28      06/20/2011 - 09/11/2011 Chemotherapy    TC X 4      10/08/2011 - 11/13/2011 Radiation Therapy    chest wall radiation therapy       12/25/2011 -  Anti-estrogen oral therapy    Arimidex 1 mg daily       11/12/2017 Relapse/Recurrence    CT scan demonstrates lesion in liver, lung, and retroperitoneal lymph node.         CURRENT THERAPY: Arimidex daily  INTERVAL HISTORY: Christina Horn 72 y.o. female returns for urgent appointment due to hypercalcemia of 13.1 while in CT for guided biopsy of her retroperitoneal lymph node.  Christina Horn is doing well today.  She denies any pain, constipation.  She does not take a calcium supplement.  She says that her calcium has been elevated in the past.    Patient Active Problem List   Diagnosis Date Noted  . Hypercalcemia 11/20/2017  . Liver metastasis ([place]) 11/14/2017  . Lung metastases ([place]) 11/14/2017  . Depression with anxiety 08/18/2016  . Dyspnea 08/18/2016  . Non-insulin dependent type 2 diabetes mellitus ([place]) 08/18/2016  . Hypertension 08/18/2016  . OSA on CPAP 08/18/2016  . Kidney disease 08/18/2016  . SOB (shortness of breath) 08/18/2016  . COPD (chronic obstructive pulmonary disease) ([place]) 08/18/2016  . Elevated troponin 08/18/2016  . Osteopenia 10/27/2015  . Breast cancer of lower-outer quadrant of left female breast ([place]) 05/09/2011    has No Known Allergies.  MEDICAL  HISTORY: Past Medical History:  Diagnosis Date  . Anxiety    h/o anxiety attack  . Arthritis    back meds not required  . Breast cancer ([place])    left breast  . Breast cancer, left breast ([place]) 05/09/2011  . COPD (chronic obstructive pulmonary disease) ([place])   . Diabetes mellitus    borderline  . Hypertension   . Pneumonia   . Sleep apnea    Uses CPAP    SURGICAL HISTORY: Past Surgical History:  Procedure Laterality Date  . ABDOMINAL HYSTERECTOMY    . BREAST SURGERY    . CHOLECYSTECTOMY    . left leg vein striping    . MASTECTOMY  06/07/11   left   . TRACHEOSTOMY    . VASCULAR SURGERY      SOCIAL HISTORY: Social History   Socioeconomic History  . Marital status: Widowed    Spouse name: Not on file  . Number of children: Not on file  . Years of education: Not on file  . Highest education level: Not on file  Social Needs  . Financial resource strain: Not on file  . Food insecurity - worry: Not on file  . Food insecurity - inability: Not on file  . Transportation needs - medical: Not on file  . Transportation needs - non-medical: Not on file  Occupational History  . Not on file  Tobacco Use  .  Smoking status: Former Smoker    Packs/day: 1.50    Years: 25.00    Pack years: 37.50    Types: Cigarettes    Last attempt to quit: 10/19/2001    Years since quitting: 16.0  . Smokeless tobacco: Never Used  Substance and Sexual Activity  . Alcohol use: No  . Drug use: No  . Sexual activity: Not Currently  Other Topics Concern  . Not on file  Social History Narrative  . Not on file    FAMILY HISTORY: Family History  Problem Relation Age of Onset  . Cancer Mother        breast  . Cancer Sister        lung  . Cancer Maternal Aunt        lung  . Cancer Maternal Grandmother        unknown  . Hypertension Sister     Review of Systems  Constitutional: Negative for appetite change, chills, fatigue, fever and unexpected weight change.  HENT:   Negative for  hearing loss and lump/mass.   Eyes: Negative for eye problems and icterus.  Respiratory: Negative for chest tightness, cough and shortness of breath.   Cardiovascular: Negative for chest pain, leg swelling and palpitations.  Gastrointestinal: Negative for abdominal distention, abdominal pain, constipation, diarrhea, nausea and vomiting.  Endocrine: Negative for hot flashes.  Musculoskeletal: Negative for arthralgias.  Skin: Negative for itching and rash.  Neurological: Negative for dizziness, extremity weakness, headaches and numbness.  Hematological: Negative for adenopathy. Does not bruise/bleed easily.  Psychiatric/Behavioral: The patient is not nervous/anxious.       PHYSICAL EXAMINATION  ECOG PERFORMANCE STATUS: 1 - Symptomatic but completely ambulatory  Vitals:   11/20/17 1145  BP: (!) 135/58  Pulse: 94  Resp: 18  Temp: 98.7 F (37.1 C)  SpO2: 94%    Physical Exam  Constitutional: She is oriented to person, place, and time and well-developed, well-nourished, and in no distress.  HENT:  Head: Normocephalic and atraumatic.  Mouth/Throat: Oropharynx is clear and moist. No oropharyngeal exudate.  Eyes: Pupils are equal, round, and reactive to light. No scleral icterus.  Neck: Neck supple.  Cardiovascular: Normal rate, regular rhythm and normal heart sounds.  Pulmonary/Chest: Effort normal and breath sounds normal.  Abdominal: Soft. Bowel sounds are normal. She exhibits no distension and no mass. There is no tenderness. There is no rebound and no guarding.  Musculoskeletal: She exhibits no edema.  Lymphadenopathy:    She has no cervical adenopathy.  Neurological: She is alert and oriented to person, place, and time.  Skin: Skin is warm and dry. No rash noted.  Psychiatric: Mood and affect normal.    LABORATORY DATA:  CBC    Component Value Date/Time   WBC 11.1 (H) 11/20/2017 0713   RBC 4.25 11/20/2017 0713   HGB 12.9 11/20/2017 0713   HGB 12.2 10/25/2016 1211    HCT 39.7 11/20/2017 0713   HCT 37.2 10/25/2016 1211   PLT 278 11/20/2017 0713   PLT 274 10/25/2016 1211   MCV 93.4 11/20/2017 0713   MCV 94.7 10/25/2016 1211   MCH 30.4 11/20/2017 0713   MCHC 32.5 11/20/2017 0713   RDW 13.3 11/20/2017 0713   RDW 13.3 10/25/2016 1211   LYMPHSABS 1.7 11/20/2017 0713   LYMPHSABS 1.7 10/25/2016 1211   MONOABS 1.0 11/20/2017 0713   MONOABS 0.6 10/25/2016 1211   EOSABS 0.2 11/20/2017 0713   EOSABS 0.2 10/25/2016 1211   BASOSABS 0.0 11/20/2017 3545  BASOSABS 0.1 10/25/2016 1211    CMP     Component Value Date/Time   NA 138 11/20/2017 0713   NA 143 10/25/2016 1211   K 4.3 11/20/2017 0713   K 4.7 10/25/2016 1211   CL 103 11/20/2017 0713   CL 100 07/30/2012 0939   CO2 27 11/20/2017 0713   CO2 28 10/25/2016 1211   GLUCOSE 108 (H) 11/20/2017 0713   GLUCOSE 108 10/25/2016 1211   GLUCOSE 167 (H) 07/30/2012 0939   BUN 14 11/20/2017 0713   BUN 13.4 10/25/2016 1211   CREATININE 0.74 11/20/2017 0713   CREATININE 0.8 10/25/2016 1211   CALCIUM 13.1 (HH) 11/20/2017 0713   CALCIUM 9.4 10/25/2016 1211   PROT 6.4 10/25/2016 1211   ALBUMIN 3.4 (L) 10/25/2016 1211   AST 18 10/25/2016 1211   ALT 21 10/25/2016 1211   ALKPHOS 55 10/25/2016 1211   BILITOT 0.41 10/25/2016 1211   GFRNONAA >60 11/20/2017 0713   GFRAA >60 11/20/2017 6578      RADIOGRAPHIC STUDIES:  Ct Biopsy  Result Date: 11/20/2017 INDICATION: 72 year old female with a past history of breast cancer and now a central hepatic mass invading the portal vein and retroperitoneal lymphadenopathy. Findings are concerning for metastatic breast cancer versus new hepatocellular carcinoma. EXAM: CT BIOPSY MEDICATIONS: None. ANESTHESIA/SEDATION: Moderate (conscious) sedation was employed during this procedure. A total of Versed 1 mg and Fentanyl 100 mcg was administered intravenously. Moderate Sedation Time: 11 minutes. The patient's level of consciousness and vital signs were monitored continuously by  radiology nursing throughout the procedure under my direct supervision. FLUOROSCOPY TIME:  Fluoroscopy Time: 0 minutes 0 seconds (0 mGy). COMPLICATIONS: None immediate. PROCEDURE: Informed written consent was obtained from the patient after a thorough discussion of the procedural risks, benefits and alternatives. All questions were addressed. A timeout was performed prior to the initiation of the procedure. A planning axial CT scan was performed. The left para-aortic lymphadenopathy was successfully identified. A suitable skin entry site was selected and marked. Sterile prep and drape was performed using chlorhexidine skin prep. Local anesthesia was attained by infiltration with 1% lidocaine. A small dermatotomy was made. Under intermittent CT guidance, a 17 gauge introducer needle was advanced and positioned at the margin of the abnormal lymph node. Multiple 18 gauge core biopsies were then coaxially obtained using the bio Pince automated biopsy device. Biopsy specimens were placed in saline and delivered to pathology for further analysis. The introducer needle was removed. Post biopsy axial CT imaging demonstrates no evidence of hemorrhage or other acute complication. IMPRESSION: Technically successful CT-guided biopsy of left para-aortic lymphadenopathy. Signed, Criselda Peaches, MD [facility] Electronically Signed   By: Jacqulynn Cadet M.D.   On: 11/20/2017 09:45     ASSESSMENT and PLAN:   Breast cancer of lower-outer quadrant of left female breast ([place]) Left breast invasive ductal carcinoma T3 N1 M0 status post mastectomy, Oncotype DX score 28 status post 4 cycles of Taxotere and Cytoxan followed by chest wall radiation and Arimidex 1 mg daily since March 2013  New lesions on CT A/P show liver, lower lung, and retroperitoneal lesions  Patient underwent biopsy today and was incidentally noted to have a calcium level of 13.1.  Rowe Robert let her receive 2 liters of IV fluids while she was undergoing biopsy.  She still has her IV.  She will receive IV Zometa today.  She will return on Monday for labs and f/u with Dr. Lindi Adie to review  her biopsy results and treatment options.      Orders Placed This Encounter  Procedures  . NM PET Image Initial (PI) Skull Base To Thigh    Standing Status:   Future    Standing Expiration Date:   11/20/2018    Order Specific Question:   If indicated for the ordered procedure, I authorize the administration of a radiopharmaceutical per Radiology protocol    Answer:   Yes    Order Specific Question:   Preferred imaging location?    Answer:   [facility]    Order Specific Question:   Radiology Contrast Protocol - do NOT remove file path    Answer:   [email or web address]  . Basic Metabolic Panel - [place] Only    Standing Status:   Standing    Number of Occurrences:   4    Standing Expiration Date:   11/20/2018  . Basic metabolic panel    Standing Status:   Standing    Number of Occurrences:   4    Standing Expiration Date:   11/20/2018  . [place] COMMUNICATION LAB    Lab Appointment 15 Minutes  . [place] COMMUNICATION    Chemotherapy Appointment  - 1 hour     All questions were answered. The patient knows to call the clinic with any problems, questions or concerns. We can certainly see the patient much sooner if necessary.  A total of (30) minutes of face-to-face time was spent with this patient with greater than 50% of that time in counseling and care-coordination.  This note was electronically signed. Scot Dock, NP 11/20/2017   Attending Note  I personally saw and examined Christina Horn. The plan of care was discussed with her. I agree with the assessment and plan as documented above. Profound hypercalcemia: Surprisingly patient is completely asymptomatic. She received IV fluids at radiology. We will administer Zometa  today. We will plan to see her back next Monday to review the pathology results from the biopsy that she had today. We will recheck her labs and follow-up on her. It is very surprising that she has hyperkalemia because the CT scan that she had previously did not show any bone metastases Signed Harriette Ohara, MD

## 2017-11-20 NOTE — Progress Notes (Signed)
Spoke with Tiffany RN in the cancer center. I notified her that Rowe Robert our PA wanted [place] to leave the IV in. She is aware and will remove the IV when they are finished.

## 2017-11-20 NOTE — Progress Notes (Signed)
Per Rowe Robert, PA patient to be transported with PIV remaining intact to [facility] after 2 hour bedrest post procedure to address high Calcium level with Mendel Ryder, NP.

## 2017-11-20 NOTE — Discharge Instructions (Signed)
Moderate Conscious Sedation, Adult, Care After These instructions provide you with information about caring for yourself after your procedure. Your health care provider may also give you more specific instructions. Your treatment has been planned according to current medical practices, but problems sometimes occur. Call your health care provider if you have any problems or questions after your procedure. What can I expect after the procedure? After your procedure, it is common:  To feel sleepy for several hours.  To feel clumsy and have poor balance for several hours.  To have poor judgment for several hours.  To vomit if you eat too soon.  Follow these instructions at home: For at least 24 hours after the procedure:   Do not: ? Participate in activities where you could fall or become injured. ? Drive. ? Use heavy machinery. ? Drink alcohol. ? Take sleeping pills or medicines that cause drowsiness. ? Make important decisions or sign legal documents. ? Take care of children on your own.  Rest. Eating and drinking  Follow the diet recommended by your health care provider.  If you vomit: ? Drink water, juice, or soup when you can drink without vomiting. ? Make sure you have little or no nausea before eating solid foods. General instructions  Have a responsible adult stay with you until you are awake and alert.  Take over-the-counter and prescription medicines only as told by your health care provider.  If you smoke, do not smoke without supervision.  Keep all follow-up visits as told by your health care provider. This is important. Contact a health care provider if:  You keep feeling nauseous or you keep vomiting.  You feel light-headed.  You develop a rash.  You have a fever. Get help right away if:  You have trouble breathing. This information is not intended to replace advice given to you by your health care provider. Make sure you discuss any questions you have  with your health care provider. Document Released: 07/22/2013 Document Revised: 03/05/2016 Document Reviewed: 01/21/2016 Elsevier Interactive Patient Education  2018 [employer].   Needle Biopsy, Care After These instructions give you information about caring for yourself after your procedure. Your doctor may also give you more specific instructions. Call your doctor if you have any problems or questions after your procedure. Follow these instructions at home:  Rest as told by your doctor.  Take medicines only as told by your doctor.  There are many different ways to close and cover the biopsy site, including stitches (sutures), skin glue, and adhesive strips. Follow instructions from your doctor about: ? How to take care of your biopsy site. ? When and how you should change your bandage (dressing). ? When you should remove your dressing. ? Removing whatever was used to close your biopsy site.  You may remove your dressing tomorrow.  You may shower tomorrow.  Check your biopsy site every day for signs of infection. Watch for: ? Redness, swelling, or pain. ? Fluid, blood, or pus. Contact a doctor if:  You have a fever.  You have redness, swelling, or pain at the biopsy site, and it lasts longer than a few days.  You have fluid, blood, or pus coming from the biopsy site.  You feel sick to your stomach (nauseous).  You throw up (vomit). Get help right away if:  You are short of breath.  You have trouble breathing.  Your chest hurts.  You feel dizzy or you pass out (faint).  You have bleeding that does  not stop with pressure or a bandage. °· You cough up blood. °· Your belly (abdomen) hurts. °This information is not intended to replace advice given to you by your health care provider. Make sure you discuss any questions you have with your health care provider. °Document Released: 09/13/2008 Document Revised: 03/08/2016 Document Reviewed: 09/27/2014 °Elsevier Interactive  Patient Education © 2018 Elsevier Inc. ° °

## 2017-11-25 ENCOUNTER — Inpatient Hospital Stay (HOSPITAL_BASED_OUTPATIENT_CLINIC_OR_DEPARTMENT_OTHER): Payer: Medicare Other | Admitting: Hematology and Oncology

## 2017-11-25 ENCOUNTER — Telehealth: Payer: Self-pay | Admitting: Hematology and Oncology

## 2017-11-25 ENCOUNTER — Other Ambulatory Visit: Payer: Self-pay | Admitting: Student

## 2017-11-25 DIAGNOSIS — C787 Secondary malignant neoplasm of liver and intrahepatic bile duct: Secondary | ICD-10-CM

## 2017-11-25 DIAGNOSIS — Z170 Estrogen receptor positive status [ER+]: Secondary | ICD-10-CM | POA: Diagnosis not present

## 2017-11-25 DIAGNOSIS — C50512 Malignant neoplasm of lower-outer quadrant of left female breast: Secondary | ICD-10-CM

## 2017-11-25 DIAGNOSIS — Z79811 Long term (current) use of aromatase inhibitors: Secondary | ICD-10-CM | POA: Diagnosis not present

## 2017-11-25 DIAGNOSIS — C7800 Secondary malignant neoplasm of unspecified lung: Secondary | ICD-10-CM | POA: Diagnosis not present

## 2017-11-25 NOTE — Progress Notes (Signed)
Patient Care Team: [name], MD as PCP - General (Internal Medicine) [name], MD (Internal Medicine) Thea Silversmith, MD (Inactive) as Attending Physician (Radiation Oncology) Christene Slates, MD as Attending Physician (Radiology)  DIAGNOSIS:  Encounter Diagnosis  Name Primary?  . Malignant neoplasm of lower-outer quadrant of left breast of female, estrogen receptor positive ([place])     SUMMARY OF ONCOLOGIC HISTORY:   Breast cancer of lower-outer quadrant of left female breast ([place])   06/07/2011 Surgery     left mastectomy : multiple foci of invasive ductal carcinoma grade 2 5.1 cm and 1 cm with high-grade DCIS, lymphovascular invasion , 1/7 [place] lymph node ER 100%, PR and her percent, Ki-67 50%, HER-2 equivocal ratio 2 , Oncotype 28      06/20/2011 - 09/11/2011 Chemotherapy    TC X 4      10/08/2011 - 11/13/2011 Radiation Therapy    chest wall radiation therapy       12/25/2011 -  Anti-estrogen oral therapy    Arimidex 1 mg daily       11/12/2017 Relapse/Recurrence    CT scan demonstrates lesion in liver, lung, and retroperitoneal lymph node.         CHIEF COMPLIANT: Follow-up to discuss the results of the biopsy  INTERVAL HISTORY: Christina Horn is a 72 year old with above-mentioned history of metastatic carcinoma with CT scan demonstrating lesions in the liver lung and retroperitoneal lymph nodes.  She had a biopsy on 11 February and at that time she was noted to have hypercalcemia and required IV fluids and Zometa treatment.  She is scheduled for a PET/CT scan this Friday.  She is here today accompanied by her son to discuss the results of the biopsy and to talk about treatment plan.  Unfortunately the biopsy results are not yet available.  REVIEW OF SYSTEMS:   Constitutional: Denies fevers, chills or abnormal weight loss Eyes: Denies blurriness of vision Ears, nose, mouth, throat, and face: Denies mucositis or sore throat Respiratory: Denies cough, dyspnea or  wheezes Cardiovascular: Denies palpitation, chest discomfort Gastrointestinal:  Denies nausea, heartburn or change in bowel habits Skin: Denies abnormal skin rashes Lymphatics: Denies new lymphadenopathy or easy bruising Neurological:Denies numbness, tingling or new weaknesses Behavioral/Psych: Mood is stable, no new changes  Extremities: No lower extremity edema  All other systems were reviewed with the patient and are negative.  I have reviewed the past medical history, past surgical history, social history and family history with the patient and they are unchanged from previous note.  ALLERGIES:  has No Known Allergies.  MEDICATIONS:  Current Outpatient Medications  Medication Sig Dispense Refill  . amLODipine (NORVASC) 5 MG tablet Take 5 mg by mouth daily.      Marland Kitchen anastrozole (ARIMIDEX) 1 MG tablet Take 1 tablet (1 mg total) by mouth daily. 90 tablet 3  . aspirin 81 MG tablet Take 81 mg by mouth daily.      . citalopram (CELEXA) 20 MG tablet Take 20 mg by mouth daily.      . fish oil-omega-3 fatty acids 1000 MG capsule Take 2 g by mouth daily.      . INCRUSE ELLIPTA 62.5 MCG/INH AEPB Inhale 1 puff into the lungs daily.    Marland Kitchen KLOR-CON M20 20 MEQ tablet Take 1 tablet by mouth daily.    Marland Kitchen losartan (COZAAR) 25 MG tablet     . oxybutynin (DITROPAN) 5 MG tablet daily.    Marland Kitchen VITAMIN D, CHOLECALCIFEROL, PO Take by mouth.      Marland Kitchen  XARELTO STARTER PACK 15 & 20 MG TBPK Take 15 mg by mouth 2 (two) times daily.     No current facility-administered medications for this visit.     PHYSICAL EXAMINATION: ECOG PERFORMANCE STATUS: 1 - Symptomatic but completely ambulatory  Vitals:   11/25/17 1434  BP: (!) 152/65  Pulse: (!) 105  Resp: 17  Temp: 98.2 F (36.8 C)  SpO2: 98%   Filed Weights   11/25/17 1434  Weight: 170 lb 1.6 oz (77.2 kg)    GENERAL:alert, no distress and comfortable SKIN: skin color, texture, turgor are normal, no rashes or significant lesions EYES: normal, Conjunctiva  are pink and non-injected, sclera clear OROPHARYNX:no exudate, no erythema and lips, buccal mucosa, and tongue normal  NECK: supple, thyroid normal size, non-tender, without nodularity LYMPH:  no palpable lymphadenopathy in the cervical, axillary or inguinal LUNGS: clear to auscultation and percussion with normal breathing effort HEART: regular rate & rhythm and no murmurs and no lower extremity edema ABDOMEN:abdomen soft, non-tender and normal bowel sounds MUSCULOSKELETAL:no cyanosis of digits and no clubbing  NEURO: alert & oriented x 3 with fluent speech, no focal motor/sensory deficits EXTREMITIES: No lower extremity edema  LABORATORY DATA:  I have reviewed the data as listed CMP Latest Ref Rng & Units 11/20/2017 10/25/2016 08/19/2016  Glucose 65 - 99 mg/dL 108(H) 108 105(H)  BUN 6 - 20 mg/dL 14 13.4 13  Creatinine 0.44 - 1.00 mg/dL 0.74 0.8 0.76  Sodium 135 - 145 mmol/L 138 143 140  Potassium 3.5 - 5.1 mmol/L 4.3 4.7 3.6  Chloride 101 - 111 mmol/L 103 - 110  CO2 22 - 32 mmol/L '27 28 27  '$ Calcium 8.9 - 10.3 mg/dL 13.1(HH) 9.4 8.4(L)  Total Protein 6.4 - 8.3 g/dL - 6.4 -  Total Bilirubin 0.20 - 1.20 mg/dL - 0.41 -  Alkaline Phos 40 - 150 U/L - 55 -  AST 5 - 34 U/L - 18 -  ALT 0 - 55 U/L - 21 -    Lab Results  Component Value Date   WBC 11.1 (H) 11/20/2017   HGB 12.9 11/20/2017   HCT 39.7 11/20/2017   MCV 93.4 11/20/2017   PLT 278 11/20/2017   NEUTROABS 8.2 (H) 11/20/2017    ASSESSMENT & PLAN:  Breast cancer of lower-outer quadrant of left female breast ([place]) Left breast invasive ductal carcinoma T3 N1 M0 status post mastectomy, Oncotype DX score 28 status post 4 cycles of Taxotere and Cytoxan followed by chest wall radiation and Arimidex 1 mg daily since March 2013  New lesions on CT A/P show liver, lower lung, and retroperitoneal lesions  Patient underwent biopsy.  However the pathology results are not yet available. Patient has a PET/CT scan this Friday. I will see her  back following Monday to discuss the results of the PET/CT scan as well as a biopsy.  If she has metastatic breast cancer that is ER PR positive then she will go on Ibrance with anastrozole.  Return to clinic next Monday to discuss the results    I spent 25 minutes talking to the patient of which more than half was spent in counseling and coordination of care.  No orders of the defined types were placed in this encounter.  The patient has a good understanding of the overall plan. she agrees with it. she will call with any problems that may develop before the next visit here.   Harriette Ohara, MD 11/25/17

## 2017-11-25 NOTE — Telephone Encounter (Signed)
Gave patient AVs and calendar of upcoming February appointments.  °

## 2017-11-25 NOTE — Assessment & Plan Note (Signed)
Left breast invasive ductal carcinoma T3 N1 M0 status post mastectomy, Oncotype DX score 28 status post 4 cycles of Taxotere and Cytoxan followed by chest wall radiation and Arimidex 1 mg daily since March 2013  New lesions on CT A/P show liver, lower lung, and retroperitoneal lesions  Patient underwent biopsy.  However the pathology results are not yet available. Patient has a PET/CT scan this Friday. I will see her back following Monday to discuss the results of the PET/CT scan as well as a biopsy.  If she has metastatic breast cancer that is ER PR positive then she will go on Ibrance with anastrozole.  Return to clinic next Monday to discuss the results

## 2017-11-29 ENCOUNTER — Ambulatory Visit (HOSPITAL_COMMUNITY)
Admission: RE | Admit: 2017-11-29 | Discharge: 2017-11-29 | Disposition: A | Discharge status: Home / Self Care | Payer: Medicare Other | Source: Ambulatory Visit | Attending: Adult Health | Admitting: Adult Health

## 2017-11-29 DIAGNOSIS — C50919 Malignant neoplasm of unspecified site of unspecified female breast: Secondary | ICD-10-CM | POA: Diagnosis not present

## 2017-11-29 DIAGNOSIS — C50512 Malignant neoplasm of lower-outer quadrant of left female breast: Secondary | ICD-10-CM | POA: Diagnosis not present

## 2017-11-29 DIAGNOSIS — C787 Secondary malignant neoplasm of liver and intrahepatic bile duct: Secondary | ICD-10-CM | POA: Diagnosis not present

## 2017-11-29 DIAGNOSIS — I700 Atherosclerosis of aorta: Secondary | ICD-10-CM | POA: Diagnosis not present

## 2017-11-29 DIAGNOSIS — Z170 Estrogen receptor positive status [ER+]: Secondary | ICD-10-CM

## 2017-11-29 DIAGNOSIS — I288 Other diseases of pulmonary vessels: Secondary | ICD-10-CM | POA: Insufficient documentation

## 2017-11-29 DIAGNOSIS — C772 Secondary and unspecified malignant neoplasm of intra-abdominal lymph nodes: Secondary | ICD-10-CM | POA: Diagnosis not present

## 2017-11-29 DIAGNOSIS — C7800 Secondary malignant neoplasm of unspecified lung: Secondary | ICD-10-CM | POA: Diagnosis not present

## 2017-11-29 LAB — GLUCOSE, CAPILLARY: Glucose-Capillary: 104 mg/dL — ABNORMAL HIGH (ref 65–99)

## 2017-11-29 MED ORDER — FLUDEOXYGLUCOSE F - 18 (FDG) INJECTION
9.5800 | Freq: Once | INTRAVENOUS | Status: AC | PRN
  Administered 2017-11-29: 9.58 via INTRAVENOUS

## 2017-12-02 ENCOUNTER — Telehealth: Payer: Self-pay | Admitting: Hematology and Oncology

## 2017-12-02 ENCOUNTER — Inpatient Hospital Stay: Payer: Medicare Other

## 2017-12-02 ENCOUNTER — Inpatient Hospital Stay (HOSPITAL_BASED_OUTPATIENT_CLINIC_OR_DEPARTMENT_OTHER): Payer: Medicare Other | Admitting: Hematology and Oncology

## 2017-12-02 VITALS — BP 130/50 | HR 93 | Temp 98.5°F | Resp 16 | Wt 172.1 lb

## 2017-12-02 DIAGNOSIS — C50512 Malignant neoplasm of lower-outer quadrant of left female breast: Secondary | ICD-10-CM

## 2017-12-02 DIAGNOSIS — Z170 Estrogen receptor positive status [ER+]: Secondary | ICD-10-CM | POA: Diagnosis not present

## 2017-12-02 DIAGNOSIS — Z79811 Long term (current) use of aromatase inhibitors: Secondary | ICD-10-CM

## 2017-12-02 DIAGNOSIS — C787 Secondary malignant neoplasm of liver and intrahepatic bile duct: Secondary | ICD-10-CM | POA: Diagnosis not present

## 2017-12-02 DIAGNOSIS — C7800 Secondary malignant neoplasm of unspecified lung: Secondary | ICD-10-CM

## 2017-12-02 DIAGNOSIS — Z5111 Encounter for antineoplastic chemotherapy: Secondary | ICD-10-CM | POA: Diagnosis not present

## 2017-12-02 LAB — BASIC METABOLIC PANEL - CANCER CENTER ONLY
Anion gap: 9 (ref 3–11)
BUN: 10 mg/dL (ref 7–26)
CALCIUM: 12.8 mg/dL — AB (ref 8.4–10.4)
CO2: 27 mmol/L (ref 22–29)
CREATININE: 0.83 mg/dL (ref 0.60–1.10)
Chloride: 103 mmol/L (ref 98–109)
GFR, Est AFR Am: >60 mL/min (ref >60)
GFR, Estimated: >60 mL/min (ref >60)
GLUCOSE: 92 mg/dL (ref 70–140)
Potassium: 4.9 mmol/L (ref 3.5–5.1)
Sodium: 139 mmol/L (ref 136–145)

## 2017-12-02 NOTE — Assessment & Plan Note (Signed)
Left breast invasive ductal carcinoma T3 N1 M0 status post mastectomy, Oncotype DX score 28 status post 4 cycles of Taxotere and Cytoxan followed by chest wall radiation and Arimidex 1 mg daily since March 2013  New lesions on CT A/P show liver, lower lung, and retroperitoneal lesions Liver biopsy: Poorly differentiated carcinoma ER PR negative  PET/CT scan: Metastatic disease involving bilateral lungs, hilar and mediastinal nodes, liver metastases extremely large size, retroperitoneal nodes  Plan: Because her tumor is ER PR negative, she will need systemic chemotherapy.  I recommended treatment with Halaven given days 1 and 8 every 3 weeks.  I discussed the risks and benefits of Halaven including the risk of neuropathy as well as cytopenias and fatigue and nausea and vomiting.    I will request for port placement and plan to start chemotherapy next week.

## 2017-12-02 NOTE — Telephone Encounter (Signed)
Gave avs and calendar for february °

## 2017-12-02 NOTE — Progress Notes (Signed)
Patient Care Team: [name], MD as PCP - General (Internal Medicine) [name], MD (Internal Medicine) Thea Silversmith, MD (Inactive) as Attending Physician (Radiation Oncology) Christene Slates, MD as Attending Physician (Radiology)  DIAGNOSIS:  Encounter Diagnoses  Name Primary?  . Malignant neoplasm of lower-outer quadrant of left breast of female, estrogen receptor positive ([place]) Yes  . Liver metastasis ([place])     SUMMARY OF ONCOLOGIC HISTORY:   Breast cancer of lower-outer quadrant of left female breast ([place])   06/07/2011 Surgery     left mastectomy : multiple foci of invasive ductal carcinoma grade 2 5.1 cm and 1 cm with high-grade DCIS, lymphovascular invasion , 1/7 Oscar lymph node ER 100%, PR and her percent, Ki-67 50%, HER-2 equivocal ratio 2 , Oncotype 28      06/20/2011 - 09/11/2011 Chemotherapy    TC X 4      10/08/2011 - 11/13/2011 Radiation Therapy    chest wall radiation therapy       12/25/2011 -  Anti-estrogen oral therapy    Arimidex 1 mg daily       11/12/2017 Relapse/Recurrence    CT scan demonstrates lesion in liver, lung, and retroperitoneal lymph node.         CHIEF COMPLIANT: Patient is here to discuss results of PET/CT scan as well as liver biopsy.  INTERVAL HISTORY: Christina Horn is a 72 year old with above-mentioned history of metastatic breast cancer.  She had a PET/CT scan and a liver biopsy and is here today accompanied by her son to discuss the results.  She does not feel too bad in spite of extensive metastatic disease.  She does not have any particular pain.  Denies any nausea vomiting.  REVIEW OF SYSTEMS:   Constitutional: Denies fevers, chills or abnormal weight loss Eyes: Denies blurriness of vision Ears, nose, mouth, throat, and face: Denies mucositis or sore throat Respiratory: Denies cough, dyspnea or wheezes Cardiovascular: Denies palpitation, chest discomfort Gastrointestinal:  Denies nausea, heartburn or change in bowel  habits Skin: Denies abnormal skin rashes Lymphatics: Denies new lymphadenopathy or easy bruising Neurological:Denies numbness, tingling or new weaknesses Behavioral/Psych: Mood is stable, no new changes  Extremities: No lower extremity edema All other systems were reviewed with the patient and are negative.  I have reviewed the past medical history, past surgical history, social history and family history with the patient and they are unchanged from previous note.  ALLERGIES:  has No Known Allergies.  MEDICATIONS:  Current Outpatient Medications  Medication Sig Dispense Refill  . amLODipine (NORVASC) 5 MG tablet Take 5 mg by mouth daily.      Marland Kitchen anastrozole (ARIMIDEX) 1 MG tablet Take 1 tablet (1 mg total) by mouth daily. 90 tablet 3  . aspirin 81 MG tablet Take 81 mg by mouth daily.      . citalopram (CELEXA) 20 MG tablet Take 20 mg by mouth daily.      . fish oil-omega-3 fatty acids 1000 MG capsule Take 2 g by mouth daily.      . INCRUSE ELLIPTA 62.5 MCG/INH AEPB Inhale 1 puff into the lungs daily.    Marland Kitchen KLOR-CON M20 20 MEQ tablet Take 1 tablet by mouth daily.    Marland Kitchen losartan (COZAAR) 25 MG tablet     . oxybutynin (DITROPAN) 5 MG tablet daily.    Marland Kitchen VITAMIN D, CHOLECALCIFEROL, PO Take by mouth.      Alveda Reasons STARTER PACK 15 & 20 MG TBPK Take 15 mg by mouth 2 (  two) times daily.     No current facility-administered medications for this visit.     PHYSICAL EXAMINATION: ECOG PERFORMANCE STATUS:1  Vitals:   12/02/17 1432  BP: (!) 130/50  Pulse: 93  Resp: 16  Temp: 98.5 F (36.9 C)  SpO2: 97%   Filed Weights   12/02/17 1432  Weight: 172 lb 1.6 oz (78.1 kg)    GENERAL:alert, no distress and comfortable SKIN: skin color, texture, turgor are normal, no rashes or significant lesions EYES: normal, Conjunctiva are pink and non-injected, sclera clear OROPHARYNX:no exudate, no erythema and lips, buccal mucosa, and tongue normal  NECK: supple, thyroid normal size, non-tender,  without nodularity LYMPH:  no palpable lymphadenopathy in the cervical, axillary or inguinal LUNGS: clear to auscultation and percussion with normal breathing effort HEART: regular rate & rhythm and no murmurs and no lower extremity edema ABDOMEN:abdomen soft, non-tender and normal bowel sounds MUSCULOSKELETAL:no cyanosis of digits and no clubbing  NEURO: alert & oriented x 3 with fluent speech, no focal motor/sensory deficits EXTREMITIES: No lower extremity edema  LABORATORY DATA:  I have reviewed the data as listed CMP Latest Ref Rng & Units 12/02/2017 11/20/2017 10/25/2016  Glucose 70 - 140 mg/dL 92 108(H) 108  BUN 7 - 26 mg/dL 10 14 13.4  Creatinine 0.60 - 1.10 mg/dL 0.83 0.74 0.8  Sodium 136 - 145 mmol/L 139 138 143  Potassium 3.5 - 5.1 mmol/L 4.9 4.3 4.7  Chloride 98 - 109 mmol/L 103 103 -  CO2 22 - 29 mmol/L _0 Calcium 8.4 - 10.4 mg/dL 12.8(H) 13.1(HH) 9.4  Total Protein 6.4 - 8.3 g/dL - - 6.4  Total Bilirubin 0.20 - 1.20 mg/dL - - 0.41  Alkaline Phos 40 - 150 U/L - - 55  AST 5 - 34 U/L - - 18  ALT 0 - 55 U/L - - 21    Lab Results  Component Value Date   WBC 11.1 (H) 11/20/2017   HGB 12.9 11/20/2017   HCT 39.7 11/20/2017   MCV 93.4 11/20/2017   PLT 278 11/20/2017   NEUTROABS 8.2 (H) 11/20/2017    ASSESSMENT & PLAN:  Breast cancer of lower-outer quadrant of left female breast ([place]) Left breast invasive ductal carcinoma T3 N1 M0 status post mastectomy, Oncotype DX score 28 status post 4 cycles of Taxotere and Cytoxan followed by chest wall radiation and Arimidex 1 mg daily since March 2013  New lesions on CT A/P show liver, lower lung, and retroperitoneal lesions Liver biopsy: Poorly differentiated carcinoma ER PR negative  PET/CT scan: Metastatic disease involving bilateral lungs, hilar and mediastinal nodes, liver metastases extremely large size, retroperitoneal nodes  Plan: Because her tumor is ER PR negative, she will need systemic chemotherapy.  I  recommended treatment with Halaven given days 1 and 8 every 3 weeks.  I discussed the risks and benefits of Halaven including the risk of neuropathy as well as cytopenias and fatigue and nausea and vomiting.    I will request for port placement and plan to start chemotherapy next week.    I spent 25 minutes talking to the patient of which more than half was spent in counseling and coordination of care.  Orders Placed This Encounter  Procedures  . IR Perc Tun Perit Cath W/Port    Standing Status:   Future    Standing Expiration Date:   01/31/2019    Order Specific Question:   Reason for exam:    Answer:   Need for chemo  within a week please    Order Specific Question:   Preferred Imaging Location?    Answer:   [facility]   The patient has a good understanding of the overall plan. she agrees with it. she will call with any problems that may develop before the next visit here.   Harriette Ohara, MD 12/02/17

## 2017-12-04 ENCOUNTER — Telehealth: Payer: Self-pay | Admitting: Hematology and Oncology

## 2017-12-04 NOTE — Telephone Encounter (Signed)
Called patient and left a message 

## 2017-12-08 ENCOUNTER — Other Ambulatory Visit: Payer: Self-pay | Admitting: Radiology

## 2017-12-09 ENCOUNTER — Other Ambulatory Visit: Payer: Self-pay | Admitting: Hematology and Oncology

## 2017-12-09 DIAGNOSIS — C7800 Secondary malignant neoplasm of unspecified lung: Secondary | ICD-10-CM

## 2017-12-09 DIAGNOSIS — Z170 Estrogen receptor positive status [ER+]: Secondary | ICD-10-CM

## 2017-12-09 DIAGNOSIS — C50512 Malignant neoplasm of lower-outer quadrant of left female breast: Secondary | ICD-10-CM

## 2017-12-09 MED ORDER — LORAZEPAM 0.5 MG PO TABS: 0.5000 mg | ORAL_TABLET | Freq: Every evening | ORAL | 0 refills | Status: DC | PRN

## 2017-12-09 MED ORDER — ONDANSETRON HCL 8 MG PO TABS: 8.0000 mg | ORAL_TABLET | Freq: Two times a day (BID) | ORAL | 1 refills | Status: AC | PRN

## 2017-12-09 MED ORDER — PROCHLORPERAZINE MALEATE 10 MG PO TABS: 10.0000 mg | ORAL_TABLET | Freq: Four times a day (QID) | ORAL | 1 refills | Status: AC | PRN

## 2017-12-09 MED ORDER — LIDOCAINE-PRILOCAINE 2.5-2.5 % EX CREA: TOPICAL_CREAM | CUTANEOUS | 3 refills | Status: AC

## 2017-12-09 NOTE — Progress Notes (Signed)
START OFF PATHWAY REGIMEN - Breast   [id number]:Eribulin 1.4 mg/m2 D1, 8 q21 Days:   A cycle is every 21 days:     Eribulin mesylate   **Always confirm dose/schedule in your pharmacy ordering system**    Patient Characteristics: Distant Metastases or Locoregional Recurrent Disease - Unresected, HER2 Negative/Unknown/Equivocal, ER Negative/Unknown, Chemotherapy, Second Line, Prior Paclitaxel Therapeutic Status: Distant Metastases BRCA Mutation Status: Did Not Order Test ER Status: Negative (-) HER2 Status: Negative (-) Would you be surprised if this patient died  in the next year<= I would be surprised if this patient died in the next year PR Status: Negative (-) Line of therapy: Second Line Intent of Therapy: Non-Curative / Palliative Intent, Discussed with Patient

## 2017-12-10 ENCOUNTER — Encounter (HOSPITAL_COMMUNITY): Payer: Self-pay

## 2017-12-10 ENCOUNTER — Encounter: Payer: Self-pay | Admitting: Hematology and Oncology

## 2017-12-10 ENCOUNTER — Ambulatory Visit (HOSPITAL_COMMUNITY)
Admission: RE | Admit: 2017-12-10 | Discharge: 2017-12-10 | Disposition: A | Discharge status: Home / Self Care | Payer: Medicare Other | Source: Ambulatory Visit | Attending: Hematology and Oncology | Admitting: Hematology and Oncology

## 2017-12-10 ENCOUNTER — Other Ambulatory Visit: Payer: Self-pay | Admitting: Hematology and Oncology

## 2017-12-10 DIAGNOSIS — I10 Essential (primary) hypertension: Secondary | ICD-10-CM | POA: Insufficient documentation

## 2017-12-10 DIAGNOSIS — M1990 Unspecified osteoarthritis, unspecified site: Secondary | ICD-10-CM | POA: Diagnosis not present

## 2017-12-10 DIAGNOSIS — Z170 Estrogen receptor positive status [ER+]: Principal | ICD-10-CM

## 2017-12-10 DIAGNOSIS — F419 Anxiety disorder, unspecified: Secondary | ICD-10-CM | POA: Diagnosis not present

## 2017-12-10 DIAGNOSIS — Z853 Personal history of malignant neoplasm of breast: Secondary | ICD-10-CM | POA: Insufficient documentation

## 2017-12-10 DIAGNOSIS — G4730 Sleep apnea, unspecified: Secondary | ICD-10-CM | POA: Diagnosis not present

## 2017-12-10 DIAGNOSIS — J449 Chronic obstructive pulmonary disease, unspecified: Secondary | ICD-10-CM | POA: Diagnosis not present

## 2017-12-10 DIAGNOSIS — C771 Secondary and unspecified malignant neoplasm of intrathoracic lymph nodes: Secondary | ICD-10-CM | POA: Diagnosis not present

## 2017-12-10 DIAGNOSIS — E119 Type 2 diabetes mellitus without complications: Secondary | ICD-10-CM | POA: Insufficient documentation

## 2017-12-10 DIAGNOSIS — C50912 Malignant neoplasm of unspecified site of left female breast: Secondary | ICD-10-CM | POA: Diagnosis not present

## 2017-12-10 DIAGNOSIS — Z7901 Long term (current) use of anticoagulants: Secondary | ICD-10-CM | POA: Diagnosis not present

## 2017-12-10 DIAGNOSIS — Z7982 Long term (current) use of aspirin: Secondary | ICD-10-CM | POA: Insufficient documentation

## 2017-12-10 DIAGNOSIS — C50512 Malignant neoplasm of lower-outer quadrant of left female breast: Secondary | ICD-10-CM

## 2017-12-10 DIAGNOSIS — C787 Secondary malignant neoplasm of liver and intrahepatic bile duct: Secondary | ICD-10-CM

## 2017-12-10 DIAGNOSIS — Z5111 Encounter for antineoplastic chemotherapy: Secondary | ICD-10-CM | POA: Diagnosis not present

## 2017-12-10 HISTORY — PX: IR US GUIDE VASC ACCESS RIGHT: IMG2390

## 2017-12-10 HISTORY — PX: IR FLUORO GUIDE PORT INSERTION RIGHT: IMG5741

## 2017-12-10 LAB — CBC WITH DIFFERENTIAL/PLATELET
BASOS ABS: 0 10*3/uL (ref 0.0–0.1)
Basophils Relative: 0 %
EOS PCT: 1 %
Eosinophils Absolute: 0.1 10*3/uL (ref 0.0–0.7)
HCT: 37.9 % (ref 36.0–46.0)
Hemoglobin: 12.3 g/dL (ref 12.0–15.0)
LYMPHS PCT: 15 %
Lymphs Abs: 1.2 10*3/uL (ref 0.7–4.0)
MCH: 30.2 pg (ref 26.0–34.0)
MCHC: 32.5 g/dL (ref 30.0–36.0)
MCV: 93.1 fL (ref 78.0–100.0)
MONO ABS: 0.8 10*3/uL (ref 0.1–1.0)
MONOS PCT: 11 %
Neutro Abs: 5.9 10*3/uL (ref 1.7–7.7)
Neutrophils Relative %: 73 %
PLATELETS: 250 10*3/uL (ref 150–400)
RBC: 4.07 MIL/uL (ref 3.87–5.11)
RDW: 13.6 % (ref 11.5–15.5)
WBC: 8 10*3/uL (ref 4.0–10.5)

## 2017-12-10 LAB — PROTIME-INR
INR: 1.05
Prothrombin Time: 13.6 seconds (ref 11.4–15.2)

## 2017-12-10 LAB — GLUCOSE, CAPILLARY: GLUCOSE-CAPILLARY: 92 mg/dL (ref 65–99)

## 2017-12-10 MED ORDER — CEFAZOLIN SODIUM-DEXTROSE 2-4 GM/100ML-% IV SOLN
2.0000 g | INTRAVENOUS | Status: AC
  Administered 2017-12-10: 2 g via INTRAVENOUS

## 2017-12-10 MED ORDER — MIDAZOLAM HCL 2 MG/2ML IJ SOLN
INTRAMUSCULAR | Status: AC | PRN
  Administered 2017-12-10: 0.5 mg via INTRAVENOUS
  Administered 2017-12-10: 1 mg via INTRAVENOUS

## 2017-12-10 MED ORDER — HEPARIN SOD (PORK) LOCK FLUSH 100 UNIT/ML IV SOLN
INTRAVENOUS | Status: AC
  Filled 2017-12-10: qty 5

## 2017-12-10 MED ORDER — FENTANYL CITRATE (PF) 100 MCG/2ML IJ SOLN
INTRAMUSCULAR | Status: AC
  Filled 2017-12-10: qty 4

## 2017-12-10 MED ORDER — HEPARIN SOD (PORK) LOCK FLUSH 100 UNIT/ML IV SOLN
INTRAVENOUS | Status: AC | PRN
  Administered 2017-12-10: 500 [IU] via INTRAVENOUS

## 2017-12-10 MED ORDER — MIDAZOLAM HCL 2 MG/2ML IJ SOLN
INTRAMUSCULAR | Status: AC
  Filled 2017-12-10: qty 4

## 2017-12-10 MED ORDER — SODIUM CHLORIDE 0.9 % IV SOLN
INTRAVENOUS | Status: DC
  Administered 2017-12-10: 13:00:00 via INTRAVENOUS

## 2017-12-10 MED ORDER — FENTANYL CITRATE (PF) 100 MCG/2ML IJ SOLN
INTRAMUSCULAR | Status: AC | PRN
  Administered 2017-12-10: 50 ug via INTRAVENOUS
  Administered 2017-12-10: 25 ug via INTRAVENOUS

## 2017-12-10 MED ORDER — LIDOCAINE-EPINEPHRINE (PF) 2 %-1:200000 IJ SOLN
INTRAMUSCULAR | Status: AC
  Filled 2017-12-10: qty 20

## 2017-12-10 MED ORDER — CEFAZOLIN SODIUM-DEXTROSE 2-4 GM/100ML-% IV SOLN
INTRAVENOUS | Status: AC
  Administered 2017-12-10: 2 g via INTRAVENOUS
  Filled 2017-12-10: qty 100

## 2017-12-10 NOTE — Discharge Instructions (Signed)
Implanted Port Insertion, Care After °This sheet gives you information about how to care for yourself after your procedure. Your health care provider may also give you more specific instructions. If you have problems or questions, contact your health care provider. °What can I expect after the procedure? °After your procedure, it is common to have: °· Discomfort at the port insertion site. °· Bruising on the skin over the port. This should improve over 3-4 days. ° °Follow these instructions at home: °Port care °· After your port is placed, you will get a manufacturer's information card. The card has information about your port. Keep this card with you at all times. °· Take care of the port as told by your health care provider. Ask your health care provider if you or a family member can get training for taking care of the port at home. A home health care nurse may also take care of the port. °· Make sure to remember what type of port you have. °Incision care °· Follow instructions from your health care provider about how to take care of your port insertion site. Make sure you: °? Wash your hands with soap and water before you change your bandage (dressing). If soap and water are not available, use hand sanitizer. °? Change your dressing as told by your health care provider. °? Leave stitches (sutures), skin glue, or adhesive strips in place. These skin closures may need to stay in place for 2 weeks or longer. If adhesive strip edges start to loosen and curl up, you may trim the loose edges. Do not remove adhesive strips completely unless your health care provider tells you to do that. °· Check your port insertion site every day for signs of infection. Check for: °? More redness, swelling, or pain. °? More fluid or blood. °? Warmth. °? Pus or a bad smell. °General instructions °· Do not take baths, swim, or use a hot tub until your health care provider approves. °· Do not lift anything that is heavier than 10 lb (4.5  kg) for a week, or as told by your health care provider. °· Ask your health care provider when it is okay to: °? Return to work or school. °? Resume usual physical activities or sports. °· Do not drive for 24 hours if you were given a medicine to help you relax (sedative). °· Take over-the-counter and prescription medicines only as told by your health care provider. °· Wear a medical alert bracelet in case of an emergency. This will tell any health care providers that you have a port. °· Keep all follow-up visits as told by your health care provider. This is important. °Contact a health care provider if: °· You cannot flush your port with saline as directed, or you cannot draw blood from the port. °· You have a fever or chills. °· You have more redness, swelling, or pain around your port insertion site. °· You have more fluid or blood coming from your port insertion site. °· Your port insertion site feels warm to the touch. °· You have pus or a bad smell coming from the port insertion site. °Get help right away if: °· You have chest pain or shortness of breath. °· You have bleeding from your port that you cannot control. °Summary °· Take care of the port as told by your health care provider. °· Change your dressing as told by your health care provider. °· Keep all follow-up visits as told by your health care provider. °  This information is not intended to replace advice given to you by your health care provider. Make sure you discuss any questions you have with your health care provider. °Document Released: 07/22/2013 Document Revised: 08/22/2016 Document Reviewed: 08/22/2016 °Elsevier Interactive Patient Education © 2017 [employer] °Moderate Conscious Sedation, Adult, Care After °These instructions provide you with information about caring for yourself after your procedure. Your health care provider may also give you more specific instructions. Your treatment has been planned according to current medical  practices, but problems sometimes occur. Call your health care provider if you have any problems or questions after your procedure. °What can I expect after the procedure? °After your procedure, it is common: °· To feel sleepy for several hours. °· To feel clumsy and have poor balance for several hours. °· To have poor judgment for several hours. °· To vomit if you eat too soon. ° °Follow these instructions at home: °For at least 24 hours after the procedure: ° °· Do not: °? Participate in activities where you could fall or become injured. °? Drive. °? Use heavy machinery. °? Drink alcohol. °? Take sleeping pills or medicines that cause drowsiness. °? Make important decisions or sign legal documents. °? Take care of children on your own. °· Rest. °Eating and drinking °· Follow the diet recommended by your health care provider. °· If you vomit: °? Drink water, juice, or soup when you can drink without vomiting. °? Make sure you have little or no nausea before eating solid foods. °General instructions °· Have a responsible adult stay with you until you are awake and alert. °· Take over-the-counter and prescription medicines only as told by your health care provider. °· If you smoke, do not smoke without supervision. °· Keep all follow-up visits as told by your health care provider. This is important. °Contact a health care provider if: °· You keep feeling nauseous or you keep vomiting. °· You feel light-headed. °· You develop a rash. °· You have a fever. °Get help right away if: °· You have trouble breathing. °This information is not intended to replace advice given to you by your health care provider. Make sure you discuss any questions you have with your health care provider. °Document Released: 07/22/2013 Document Revised: 03/05/2016 Document Reviewed: 01/21/2016 °Elsevier Interactive Patient Education © 2018 Elsevier Inc. ° °

## 2017-12-10 NOTE — H&P (Signed)
Referring Physician(s): Nicholas Lose  Supervising Physician: Sandi Mariscal  Patient Status:  [facility] OP  Chief Complaint:  "I am here to get a Port-A-Cath"  Subjective: Patient familiar to IR service from recent left para-aortic lymph node biopsy on 11/20/2017 yielding metastatic poorly differentiated carcinoma.She has a history of left breast carcinoma 2012, status post surgery and chemoradiation.  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    She presents again today for Port-A-Cath placement for planned chemotherapy.  She currently denies fever, headache, chest pain, dyspnea, cough, back pain, vomiting or bleeding.  She does have some mild epigastric discomfort and occasional nausea.   Past Medical History:  Diagnosis Date  . Anxiety    h/o anxiety attack  . Arthritis    back meds not required  . Breast cancer ([place])    left breast  . Breast cancer, left breast ([place]) 05/09/2011  . COPD (chronic obstructive pulmonary disease) ([place])   . Diabetes mellitus    borderline  . Hypertension   . Pneumonia   . Sleep apnea    Uses CPAP   Past Surgical History:  Procedure Laterality Date  . ABDOMINAL HYSTERECTOMY    . BREAST SURGERY    . CHOLECYSTECTOMY    . left leg vein striping    . MASTECTOMY  06/07/11   left   . TRACHEOSTOMY    . VASCULAR SURGERY         Allergies: Patient has no known allergies.  Medications: Prior to Admission medications   Medication Sig Start Date End Date Taking? Authorizing Provider  amLODipine (NORVASC) 5 MG tablet Take 5 mg by mouth daily.     Yes [provider]  anastrozole (ARIMIDEX) 1 MG tablet Take 1 tablet (1 mg total) by mouth daily. 10/25/17  Yes Nicholas Lose, MD  citalopram (CELEXA) 20 MG  tablet Take 20 mg by mouth daily.     Yes [provider]  fish oil-omega-3 fatty acids 1000 MG capsule Take 2 g by mouth daily.     Yes [provider]  INCRUSE ELLIPTA 62.5 MCG/INH AEPB Inhale 1 puff into the lungs daily. 08/08/16  Yes [provider]  KLOR-CON M20 20 MEQ tablet Take 1 tablet by mouth daily. 05/30/16  Yes [provider]  losartan (COZAAR) 25 MG tablet  02/05/13  Yes [provider]  Melatonin 3 MG CAPS Take by mouth.   Yes [provider]  oxybutynin (DITROPAN) 5 MG tablet daily. 12/05/13  Yes [provider]  VITAMIN D, CHOLECALCIFEROL, PO Take by mouth.     Yes [provider]  aspirin 81 MG tablet Take 81 mg by mouth daily.      [provider]  lidocaine-prilocaine (EMLA) cream Apply to affected area once 12/09/17   Nicholas Lose, MD  LORazepam (ATIVAN) 0.5 MG tablet Take 1 tablet (0.5 mg total) by mouth at bedtime as needed (Nausea or vomiting). 12/09/17   Nicholas Lose, MD  ondansetron (ZOFRAN) 8 MG tablet Take 1 tablet (8 mg total) by mouth 2 (two) times daily as needed (Nausea or vomiting). 12/09/17   Nicholas Lose, MD  prochlorperazine (COMPAZINE) 10 MG tablet Take 1 tablet (10 mg total) by mouth every 6 (six) hours as needed (Nausea or vomiting). 12/09/17  Nicholas Lose, MD  XARELTO STARTER PACK 15 & 20 MG TBPK Take 15 mg by mouth 2 (two) times daily. 11/13/17   [provider]     Vital Signs: Blood pressure 155/73, heart rate 88, temp 99.5, respirations 18, O2 sat 98% room air   Physical Exam awake, alert.  Chest with distant breath sounds bilaterally.  Heart with regular rate and rhythm.  Abdomen obese, soft, positive bowel sounds, mild epigastric tenderness to palpation.  Trace to 1+ bilateral lower extremity edema.  Imaging: No results found.  Labs:  CBC: Recent Labs    11/20/17 0713  WBC 11.1*  HGB 12.9  HCT 39.7  PLT 278    COAGS: Recent Labs    11/20/17 0713   INR 1.05    BMP: Recent Labs    11/20/17 0713 12/02/17 1331  NA 138 139  K 4.3 4.9  CL 103 103  CO2 27 27  GLUCOSE 108* 92  BUN 14 10  CALCIUM 13.1* 12.8*  CREATININE 0.74 0.83  GFRNONAA >60 >60  GFRAA >60 >60    LIVER FUNCTION TESTS: No results for input(s): BILITOT, AST, ALT, ALKPHOS, PROT, ALBUMIN in the last 8760 hours.  Assessment and Plan: Patient with prior history of breast cancer 2012 , status post surgery and chemoradiation; now with progressive disease and recent lymph node biopsy revealing metastatic poorly differentiated carcinoma.  She presents today for Port-A-Cath placement for chemotherapy.Risks and benefits discussed with the patient/son including, but not limited to bleeding, infection, pneumothorax, or fibrin sheath development and need for additional procedures.  All of the patient's questions were answered, patient is agreeable to proceed. Consent signed and in chart.     Electronically Signed: D. Rowe Robert, PA-C 12/10/2017, 1:01 PM   I spent a total of 20 minutes at the the patient's bedside AND on the patient's hospital floor or unit, greater than 50% of which was counseling/coordinating care for port a cath placement

## 2017-12-10 NOTE — Procedures (Signed)
Pre Procedure Dx: Metastatic Breast Cancer Post Procedural Dx: Same  Successful placement of right IJ approach port-a-cath with tip at the superior caval atrial junction. The catheter is ready for immediate use.  Estimated Blood Loss: Minimal  Complications: None immediate.  Jay Daqwan Dougal, MD Pager #: [phone]   

## 2017-12-10 NOTE — Sedation Documentation (Signed)
MD suturing port pocket at this time 

## 2017-12-10 NOTE — Progress Notes (Signed)
Received PA requests for Lidocaine/Prilocaine cream and Ondansetron.  Submitted both through Cover My Meds:  Lidocaine/Prilocaine cream pending  Ondansetron experiencing a technical difficulty and had to email the help desk.

## 2017-12-10 NOTE — Progress Notes (Signed)
Resent PA request for Ondansetron via Cover My Meds:  Pending

## 2017-12-11 ENCOUNTER — Inpatient Hospital Stay (HOSPITAL_BASED_OUTPATIENT_CLINIC_OR_DEPARTMENT_OTHER): Payer: Medicare Other | Admitting: Adult Health

## 2017-12-11 ENCOUNTER — Ambulatory Visit: Payer: Medicare Other

## 2017-12-11 ENCOUNTER — Encounter: Payer: Self-pay | Admitting: Adult Health

## 2017-12-11 ENCOUNTER — Encounter: Payer: Self-pay | Admitting: Hematology and Oncology

## 2017-12-11 ENCOUNTER — Inpatient Hospital Stay: Payer: Medicare Other

## 2017-12-11 ENCOUNTER — Telehealth: Payer: Self-pay | Admitting: Adult Health

## 2017-12-11 VITALS — BP 150/55 | HR 96 | Temp 98.9°F | Resp 16 | Ht 59.0 in | Wt 176.0 lb

## 2017-12-11 DIAGNOSIS — C7800 Secondary malignant neoplasm of unspecified lung: Secondary | ICD-10-CM | POA: Diagnosis not present

## 2017-12-11 DIAGNOSIS — C50512 Malignant neoplasm of lower-outer quadrant of left female breast: Secondary | ICD-10-CM | POA: Diagnosis not present

## 2017-12-11 DIAGNOSIS — R188 Other ascites: Secondary | ICD-10-CM | POA: Diagnosis not present

## 2017-12-11 DIAGNOSIS — Z170 Estrogen receptor positive status [ER+]: Secondary | ICD-10-CM | POA: Diagnosis not present

## 2017-12-11 DIAGNOSIS — C787 Secondary malignant neoplasm of liver and intrahepatic bile duct: Secondary | ICD-10-CM | POA: Diagnosis not present

## 2017-12-11 DIAGNOSIS — Z5111 Encounter for antineoplastic chemotherapy: Secondary | ICD-10-CM | POA: Diagnosis not present

## 2017-12-11 LAB — CBC WITH DIFFERENTIAL (CANCER CENTER ONLY)
BASOS PCT: 0 %
Basophils Absolute: 0 10*3/uL (ref 0.0–0.1)
EOS ABS: 0.1 10*3/uL (ref 0.0–0.5)
EOS PCT: 1 %
HCT: 35.7 % (ref 34.8–46.6)
Hemoglobin: 11.4 g/dL — ABNORMAL LOW (ref 11.6–15.9)
LYMPHS ABS: 0.8 10*3/uL — AB (ref 0.9–3.3)
Lymphocytes Relative: 9 %
MCH: 30.3 pg (ref 25.1–34.0)
MCHC: 31.9 g/dL (ref 31.5–36.0)
MCV: 94.9 fL (ref 79.5–101.0)
MONOS PCT: 9 %
Monocytes Absolute: 0.9 10*3/uL (ref 0.1–0.9)
Neutro Abs: 7.9 10*3/uL — ABNORMAL HIGH (ref 1.5–6.5)
Neutrophils Relative %: 81 %
PLATELETS: 223 10*3/uL (ref 145–400)
RBC: 3.76 MIL/uL (ref 3.70–5.45)
RDW: 13.6 % (ref 11.2–14.5)
WBC Count: 9.6 10*3/uL (ref 3.9–10.3)

## 2017-12-11 LAB — CMP (CANCER CENTER ONLY)
ALK PHOS: 85 U/L (ref 40–150)
ALT: 25 U/L (ref 0–55)
AST: 31 U/L (ref 5–34)
Albumin: 2.7 g/dL — ABNORMAL LOW (ref 3.5–5.0)
Anion gap: 9 (ref 3–11)
BUN: 7 mg/dL (ref 7–26)
CALCIUM: 10.7 mg/dL — AB (ref 8.4–10.4)
CO2: 22 mmol/L (ref 22–29)
CREATININE: 0.75 mg/dL (ref 0.60–1.10)
Chloride: 105 mmol/L (ref 98–109)
GFR, Est AFR Am: >60 mL/min (ref >60)
Glucose, Bld: 158 mg/dL — ABNORMAL HIGH (ref 70–140)
Potassium: 3.3 mmol/L — ABNORMAL LOW (ref 3.5–5.1)
SODIUM: 136 mmol/L (ref 136–145)
Total Bilirubin: 0.6 mg/dL (ref 0.2–1.2)
Total Protein: 6 g/dL — ABNORMAL LOW (ref 6.4–8.3)

## 2017-12-11 MED ORDER — HEPARIN SOD (PORK) LOCK FLUSH 100 UNIT/ML IV SOLN
500.0000 [IU] | Freq: Once | INTRAVENOUS | Status: AC | PRN
  Administered 2017-12-11: 500 [IU]
  Filled 2017-12-11: qty 5

## 2017-12-11 MED ORDER — SODIUM CHLORIDE 0.9% FLUSH
10.0000 mL | Freq: Once | INTRAVENOUS | Status: AC | PRN
  Administered 2017-12-11: 10 mL
  Filled 2017-12-11: qty 10

## 2017-12-11 MED ORDER — SODIUM CHLORIDE 0.9 % IV SOLN
Freq: Once | INTRAVENOUS | Status: AC
  Administered 2017-12-11: 10:00:00 via INTRAVENOUS

## 2017-12-11 MED ORDER — PROCHLORPERAZINE MALEATE 10 MG PO TABS
ORAL_TABLET | ORAL | Status: AC
  Filled 2017-12-11: qty 1

## 2017-12-11 MED ORDER — SODIUM CHLORIDE 0.9 % IV SOLN
1.3800 mg/m2 | Freq: Once | INTRAVENOUS | Status: AC
  Administered 2017-12-11: 2.5 mg via INTRAVENOUS
  Filled 2017-12-11: qty 5

## 2017-12-11 MED ORDER — PROCHLORPERAZINE MALEATE 10 MG PO TABS
10.0000 mg | ORAL_TABLET | Freq: Once | ORAL | Status: AC
  Administered 2017-12-11: 10 mg via ORAL

## 2017-12-11 MED ORDER — SODIUM CHLORIDE 0.9% FLUSH
10.0000 mL | INTRAVENOUS | Status: DC | PRN
  Administered 2017-12-11: 10 mL
  Filled 2017-12-11: qty 10

## 2017-12-11 NOTE — Assessment & Plan Note (Addendum)
Left breast invasive ductal carcinoma T3 N1 M0 status post mastectomy, Oncotype DX score 28 status post 4 cycles of Taxotere and Cytoxan followed by chest wall radiation and Arimidex 1 mg daily since March 2013  New lesions on CT A/P show liver, lower lung, and retroperitoneal lesions Liver biopsy: Poorly differentiated carcinoma ER PR negative  PET/CT scan: Metastatic disease involving bilateral lungs, hilar and mediastinal nodes, liver metastases extremely large size, retroperitoneal nodes  Plan: Because her tumor is ER PR negative, she will need systemic chemotherapy.  Today is day 1 cycle 1 of Eribulin.  This will be given on day 1 and 8 of a 21 day cycle. I recommended she pick up her anti emetics.  I again reviewed with her the possible side effects of the treatment with her.  She was given detailed info in her AVS as well.    She also saw Dr. Lindi Adie today due to her ascites.  As she is asymptomatic, we will monitor her abdomen and may need to intervene with paracentesis or drain placement if it progresses and she develops symptoms.    Janann will return in one week for labs, f/u with Dr. Lindi Adie, and cycle 1 day 8 of Eribulin therapy.

## 2017-12-11 NOTE — Progress Notes (Signed)
Received PA determinations from [employer]:  Lidocaine/Prilocaine cream approved 10/13/17 - 10/14/18.  Odansetron approved 10/13/17 - 10/14/18.  Called [facility] pharmacy([place]) to advise of approvals. She states she would run them back through.

## 2017-12-11 NOTE — Patient Instructions (Addendum)
[facility] Discharge Instructions for Patients Receiving Chemotherapy  Today you received the following chemotherapy agents:  Halaven.  To help prevent nausea and vomiting after your treatment, we encourage you to take your nausea medication as directed.   If you develop nausea and vomiting that is not controlled by your nausea medication, call the clinic.   BELOW ARE SYMPTOMS THAT SHOULD BE REPORTED IMMEDIATELY:  *FEVER GREATER THAN 100.5 F  *CHILLS WITH OR WITHOUT FEVER  NAUSEA AND VOMITING THAT IS NOT CONTROLLED WITH YOUR NAUSEA MEDICATION  *UNUSUAL SHORTNESS OF BREATH  *UNUSUAL BRUISING OR BLEEDING  TENDERNESS IN MOUTH AND THROAT WITH OR WITHOUT PRESENCE OF ULCERS  *URINARY PROBLEMS  *BOWEL PROBLEMS  UNUSUAL RASH Items with * indicate a potential emergency and should be followed up as soon as possible.  Feel free to call the clinic should you have any questions or concerns. The clinic phone number is [phone].  Please show the CHEMO ALERT CARD at check-in to the Emergency Department and triage nurse.  Eribulin solution for injection What is this medicine? ERIBULIN (er e bu lin) is a chemotherapy drug. It is used to treat breast cancer and liposarcoma. This medicine may be used for other purposes; ask your health care provider or pharmacist if you have questions. COMMON BRAND NAME(S): Halaven What should I tell my health care provider before I take this medicine? They need to know if you have any of these conditions: -heart disease -history of irregular heartbeat -kidney disease -liver disease -low blood counts, like low white cell, platelet, or red cell counts -low levels of potassium or magnesium in the blood -an unusual or allergic reaction to eribulin, other medicines, foods, dyes, or preservatives -pregnant or trying to get pregnant -breast-feeding How should I use this medicine? This medicine is for infusion into a vein. It is given by  a health care professional in a hospital or clinic setting. Talk to your pediatrician regarding the use of this medicine in children. Special care may be needed. Overdosage: If you think you have taken too much of this medicine contact a poison control center or emergency room at once. NOTE: This medicine is only for you. Do not share this medicine with others. What if I miss a dose? It is important not to miss your dose. Call your doctor or health care professional if you are unable to keep an appointment. What may interact with this medicine? Do not take this medicine with any of the following medications: -amiodarone -astemizole -arsenic trioxide -bepridil -bretylium -chloroquine -chlorpromazine -cisapride -clarithromycin -dextromethorphan, quinidine -disopyramide -dofetilide -droperidol -dronedarone -erythromycin -grepafloxacin -halofantrine -haloperidol -ibutilide -levomethadyl -mesoridazine -methadone -pentamidine -procainamide -quinidine -pimozide -posaconazole -probucol -propafenone -saquinavir -sotalol -sparfloxacin -terfenadine -thioridazine -troleandomycin -ziprasidone This list may not describe all possible interactions. Give your health care provider a list of all the medicines, herbs, non-prescription drugs, or dietary supplements you use. Also tell them if you smoke, drink alcohol, or use illegal drugs. Some items may interact with your medicine. What should I watch for while using this medicine? This drug may make you feel generally unwell. This is not uncommon, as chemotherapy can affect healthy cells as well as cancer cells. Report any side effects. Continue your course of treatment even though you feel ill unless your doctor tells you to stop. Call your doctor or health care professional for advice if you get a fever, chills or sore throat, or other symptoms of a cold or flu. Do not treat yourself. This drug decreases   your body's ability to fight  infections. Try to avoid being around people who are sick. This medicine may increase your risk to bruise or bleed. Call your doctor or health care professional if you notice any unusual bleeding. You may need blood work done while you are taking this medicine. Do not become pregnant while taking this medicine or for 2 weeks after stopping it. Women should inform their doctor if they wish to become pregnant or think they might be pregnant. Men should not father a child while taking this medicine and for 3.5 months after stopping it. There is a potential for serious side effects to an unborn child. Talk to your health care professional or pharmacist for more information. Do not breast-feed an infant while taking this medicine or for 2 weeks after stopping it. What side effects may I notice from receiving this medicine? Side effects that you should report to your doctor or health care professional as soon as possible: -allergic reactions like skin rash, itching or hives, swelling of the face, lips, or tongue -low blood counts - this medicine may decrease the number of white blood cells, red blood cells and platelets. You may be at increased risk for infections and bleeding. -signs of infection - fever or chills, cough, sore throat, pain or difficulty passing urine -signs of decreased platelets or bleeding - bruising, pinpoint red spots on the skin, black, tarry stools, blood in the urine -signs of decreased red blood cells - unusually weak or tired, fainting spells, lightheadedness -pain, tingling, numbness in the hands or feet Side effects that usually do not require medical attention (report to your doctor or health care professional if they continue or are bothersome): -constipation -hair loss -headache -loss of appetite -muscle or joint pain -nausea, vomiting -stomach pain This list may not describe all possible side effects. Call your doctor for medical advice about side effects. You may  report side effects to FDA at 1-800-FDA-1088. Where should I keep my medicine? This drug is given in a hospital or clinic and will not be stored at home. NOTE: This sheet is a summary. It may not cover all possible information. If you have questions about this medicine, talk to your doctor, pharmacist, or health care provider.  2018 [employer] Standard (2015-11-03 10:11:26)    

## 2017-12-11 NOTE — Telephone Encounter (Signed)
No 2/27 los.  

## 2017-12-11 NOTE — Patient Instructions (Signed)
Eribulin solution for injection  What is this medicine?  ERIBULIN (er e bu lin) is a chemotherapy drug. It is used to treat breast cancer and liposarcoma.  This medicine may be used for other purposes; ask your health care provider or pharmacist if you have questions.  COMMON BRAND NAME(S): Halaven  What should I tell my health care provider before I take this medicine?  They need to know if you have any of these conditions:  -heart disease  -history of irregular heartbeat  -kidney disease  -liver disease  -low blood counts, like low white cell, platelet, or red cell counts  -low levels of potassium or magnesium in the blood  -an unusual or allergic reaction to eribulin, other medicines, foods, dyes, or preservatives  -pregnant or trying to get pregnant  -breast-feeding  How should I use this medicine?  This medicine is for infusion into a vein. It is given by a health care professional in a hospital or clinic setting.  Talk to your pediatrician regarding the use of this medicine in children. Special care may be needed.  Overdosage: If you think you have taken too much of this medicine contact a poison control center or emergency room at once.  NOTE: This medicine is only for you. Do not share this medicine with others.  What if I miss a dose?  It is important not to miss your dose. Call your doctor or health care professional if you are unable to keep an appointment.  What may interact with this medicine?  Do not take this medicine with any of the following medications:  -amiodarone  -astemizole  -arsenic trioxide  -bepridil  -bretylium  -chloroquine  -chlorpromazine  -cisapride  -clarithromycin  -dextromethorphan,  quinidine  -disopyramide  -dofetilide  -droperidol  -dronedarone  -erythromycin  -grepafloxacin  -halofantrine  -haloperidol  -ibutilide  -levomethadyl  -mesoridazine  -methadone  -pentamidine  -procainamide  -quinidine  -pimozide  -posaconazole  -probucol  -propafenone  -saquinavir  -sotalol  -sparfloxacin  -terfenadine  -thioridazine  -troleandomycin  -ziprasidone  This list may not describe all possible interactions. Give your health care provider a list of all the medicines, herbs, non-prescription drugs, or dietary supplements you use. Also tell them if you smoke, drink alcohol, or use illegal drugs. Some items may interact with your medicine.  What should I watch for while using this medicine?  This drug may make you feel generally unwell. This is not uncommon, as chemotherapy can affect healthy cells as well as cancer cells. Report any side effects. Continue your course of treatment even though you feel ill unless your doctor tells you to stop.  Call your doctor or health care professional for advice if you get a fever, chills or sore throat, or other symptoms of a cold or flu. Do not treat yourself. This drug decreases your body's ability to fight infections. Try to avoid being around people who are sick.  This medicine may increase your risk to bruise or bleed. Call your doctor or health care professional if you notice any unusual bleeding.  You may need blood work done while you are taking this medicine.  Do not become pregnant while taking this medicine or for 2 weeks after stopping it. Women should inform their doctor if they wish to become pregnant or think they might be pregnant. Men should not father a child while taking this medicine and for 3.5 months after stopping it. There is a potential for serious side effects to an unborn child.   Talk to your health care professional or pharmacist for more information. Do not breast-feed an infant while taking this medicine or for 2 weeks after stopping  it.  What side effects may I notice from receiving this medicine?  Side effects that you should report to your doctor or health care professional as soon as possible:  -allergic reactions like skin rash, itching or hives, swelling of the face, lips, or tongue  -low blood counts - this medicine may decrease the number of white blood cells, red blood cells and platelets. You may be at increased risk for infections and bleeding.  -signs of infection - fever or chills, cough, sore throat, pain or difficulty passing urine  -signs of decreased platelets or bleeding - bruising, pinpoint red spots on the skin, black, tarry stools, blood in the urine  -signs of decreased red blood cells - unusually weak or tired, fainting spells, lightheadedness  -pain, tingling, numbness in the hands or feet  Side effects that usually do not require medical attention (report to your doctor or health care professional if they continue or are bothersome):  -constipation  -hair loss  -headache  -loss of appetite  -muscle or joint pain  -nausea, vomiting  -stomach pain  This list may not describe all possible side effects. Call your doctor for medical advice about side effects. You may report side effects to FDA at 1-800-FDA-1088.  Where should I keep my medicine?  This drug is given in a hospital or clinic and will not be stored at home.  NOTE: This sheet is a summary. It may not cover all possible information. If you have questions about this medicine, talk to your doctor, pharmacist, or health care provider.   2018 [employer] Standard (2015-11-03 10:11:26)

## 2017-12-11 NOTE — Progress Notes (Addendum)
[facility] Follow up:    Christina Horn, [address] [id number]   DIAGNOSIS: Cancer Staging No matching staging information was found for the patient.  SUMMARY OF ONCOLOGIC HISTORY:   Breast cancer of lower-outer quadrant of left female breast ([place])   06/07/2011 Surgery     left mastectomy : multiple foci of invasive ductal carcinoma grade 2 5.1 cm and 1 cm with high-grade DCIS, lymphovascular invasion , 1/7 Oscar lymph node ER 100%, PR and her percent, Ki-67 50%, HER-2 equivocal ratio 2 , Oncotype 28      06/20/2011 - 09/11/2011 Chemotherapy    TC X 4      10/08/2011 - 11/13/2011 Radiation Therapy    chest wall radiation therapy       12/25/2011 -  Anti-estrogen oral therapy    Arimidex 1 mg daily       11/12/2017 Relapse/Recurrence    CT scan demonstrates lesion in liver, lung, and retroperitoneal lymph node.        12/11/2017 -  Chemotherapy    Eribulin given on day 1 and day 8 of a 21 day cycle.        CURRENT THERAPY: Eribulin  INTERVAL HISTORY: Christina Horn 72 y.o. female returns for evaluation prior to receiving adjuvant chemotherapy for her metastatic breast cancer.  She is doing well today.  She has had some nausea and upper abdomen pain in the past week.  She notes her abdomen is getting larger, and she has gained about 4 pounds since her last appointment with [place].  She is going to pick her medications up today.  She denies any issues today otherwise.     Patient Active Problem List   Diagnosis Date Noted  . Hypercalcemia 11/20/2017  . Liver metastasis ([place]) 11/14/2017  . Lung metastases ([place]) 11/14/2017  . Depression with anxiety 08/18/2016  . Dyspnea 08/18/2016  . Non-insulin dependent type 2 diabetes mellitus ([place]) 08/18/2016  . Hypertension 08/18/2016  . OSA on CPAP 08/18/2016  . Kidney disease 08/18/2016  . SOB (shortness of breath) 08/18/2016  . COPD (chronic obstructive pulmonary disease) ([place])  08/18/2016  . Elevated troponin 08/18/2016  . Osteopenia 10/27/2015  . Breast cancer of lower-outer quadrant of left female breast ([place]) 05/09/2011    has No Known Allergies.  MEDICAL HISTORY: Past Medical History:  Diagnosis Date  . Anxiety    h/o anxiety attack  . Arthritis    back meds not required  . Breast cancer ([place])    left breast  . Breast cancer, left breast ([place]) 05/09/2011  . COPD (chronic obstructive pulmonary disease) ([place])   . Diabetes mellitus    borderline  . Hypertension   . Pneumonia   . Sleep apnea    Uses CPAP    SURGICAL HISTORY: Past Surgical History:  Procedure Laterality Date  . ABDOMINAL HYSTERECTOMY    . BREAST SURGERY    . CHOLECYSTECTOMY    . IR FLUORO GUIDE PORT INSERTION RIGHT  12/10/2017  . IR US GUIDE VASC ACCESS RIGHT  12/10/2017  . left leg vein striping    . MASTECTOMY  06/07/11   left   . TRACHEOSTOMY    . VASCULAR SURGERY      SOCIAL HISTORY: Social History   Socioeconomic History  . Marital status: Widowed    Spouse name: Not on file  . Number of children: Not on file  . Years of education: Not on file  .  Highest education level: Not on file  Social Needs  . Financial resource strain: Not on file  . Food insecurity - worry: Not on file  . Food insecurity - inability: Not on file  . Transportation needs - medical: Not on file  . Transportation needs - non-medical: Not on file  Occupational History  . Not on file  Tobacco Use  . Smoking status: Former Smoker    Packs/day: 1.50    Years: 25.00    Pack years: 37.50    Types: Cigarettes    Last attempt to quit: 10/19/2001    Years since quitting: 16.1  . Smokeless tobacco: Never Used  Substance and Sexual Activity  . Alcohol use: No  . Drug use: No  . Sexual activity: Not Currently  Other Topics Concern  . Not on file  Social History Narrative  . Not on file    FAMILY HISTORY: Family History  Problem Relation Age of Onset  . Cancer Mother        breast  .  Cancer Sister        lung  . Cancer Maternal Aunt        lung  . Cancer Maternal Grandmother        unknown  . Hypertension Sister     Review of Systems  Constitutional: Negative for appetite change, chills, fatigue, fever and unexpected weight change.  HENT:   Negative for hearing loss and lump/mass.   Eyes: Negative for eye problems and icterus.  Respiratory: Negative for chest tightness, cough and shortness of breath.   Cardiovascular: Negative for chest pain, leg swelling and palpitations.  Gastrointestinal: Positive for nausea. Negative for abdominal distention, abdominal pain, diarrhea and vomiting.  Endocrine: Negative for hot flashes.  Musculoskeletal: Negative for arthralgias.  Skin: Negative for itching and rash.  Neurological: Negative for dizziness, extremity weakness, headaches and numbness.  Hematological: Negative for adenopathy. Does not bruise/bleed easily.  Psychiatric/Behavioral: Negative for depression. The patient is not nervous/anxious.       PHYSICAL EXAMINATION  ECOG PERFORMANCE STATUS: 1 - Symptomatic but completely ambulatory  Vitals:   12/11/17 0817  BP: (!) 150/55  Pulse: 96  Resp: 16  Temp: 98.9 F (37.2 C)  SpO2: 96%    Physical Exam  Constitutional: She is oriented to person, place, and time and well-developed, well-nourished, and in no distress.  HENT:  Head: Normocephalic and atraumatic.  Mouth/Throat: Oropharynx is clear and moist. No oropharyngeal exudate.  Eyes: Pupils are equal, round, and reactive to light. No scleral icterus.  Neck: Neck supple.  Cardiovascular: Normal rate, regular rhythm and normal heart sounds.  Pulmonary/Chest: Effort normal and breath sounds normal.  Abdominal: Soft. She exhibits distension. There is no tenderness. There is no rebound and no guarding.  + hepatomegaly, + ascites  Musculoskeletal: She exhibits no edema.  Lymphadenopathy:    She has no cervical adenopathy.  Neurological: She is alert and  oriented to person, place, and time.  Skin: Skin is warm and dry. No rash noted.  Psychiatric: Mood and affect normal.    LABORATORY DATA:  CBC    Component Value Date/Time   WBC 9.6 12/11/2017 0749   WBC 8.0 12/10/2017 1247   RBC 3.76 12/11/2017 0749   HGB 12.3 12/10/2017 1247   HGB 12.2 10/25/2016 1211   HCT 35.7 12/11/2017 0749   HCT 37.2 10/25/2016 1211   PLT 223 12/11/2017 0749   PLT 274 10/25/2016 1211   MCV 94.9 12/11/2017 0749  MCV 94.7 10/25/2016 1211   MCH 30.3 12/11/2017 0749   MCHC 31.9 12/11/2017 0749   RDW 13.6 12/11/2017 0749   RDW 13.3 10/25/2016 1211   LYMPHSABS 0.8 (L) 12/11/2017 0749   LYMPHSABS 1.7 10/25/2016 1211   MONOABS 0.9 12/11/2017 0749   MONOABS 0.6 10/25/2016 1211   EOSABS 0.1 12/11/2017 0749   EOSABS 0.2 10/25/2016 1211   BASOSABS 0.0 12/11/2017 0749   BASOSABS 0.1 10/25/2016 1211    CMP     Component Value Date/Time   NA 139 12/02/2017 1331   NA 143 10/25/2016 1211   K 4.9 12/02/2017 1331   K 4.7 10/25/2016 1211   CL 103 12/02/2017 1331   CL 100 07/30/2012 0939   CO2 27 12/02/2017 1331   CO2 28 10/25/2016 1211   GLUCOSE 92 12/02/2017 1331   GLUCOSE 108 10/25/2016 1211   GLUCOSE 167 (H) 07/30/2012 0939   BUN 10 12/02/2017 1331   BUN 13.4 10/25/2016 1211   CREATININE 0.83 12/02/2017 1331   CREATININE 0.8 10/25/2016 1211   CALCIUM 12.8 (H) 12/02/2017 1331   CALCIUM 9.4 10/25/2016 1211   PROT 6.4 10/25/2016 1211   ALBUMIN 3.4 (L) 10/25/2016 1211   AST 18 10/25/2016 1211   ALT 21 10/25/2016 1211   ALKPHOS 55 10/25/2016 1211   BILITOT 0.41 10/25/2016 1211   GFRNONAA >60 12/02/2017 1331   GFRAA >60 12/02/2017 1331       PENDING LABS:   RADIOGRAPHIC STUDIES:  Ir US Guide Vasc Access Right  Result Date: 12/10/2017 INDICATION: History of left-sided metastatic breast cancer. Please perform Port a catheter placement for durable intravenous access for chemotherapy administration. EXAM: IMPLANTED PORT A CATH PLACEMENT WITH  ULTRASOUND AND FLUOROSCOPIC GUIDANCE COMPARISON:  PET-CT - 11/29/2017 MEDICATIONS: Ancef 2 gm IV; The antibiotic was administered within an appropriate time interval prior to skin puncture. ANESTHESIA/SEDATION: Moderate (conscious) sedation was employed during this procedure. A total of Versed 1.5 mg and Fentanyl 75 mcg was administered intravenously. Moderate Sedation Time: 27 minutes. The patient's level of consciousness and vital signs were monitored continuously by radiology nursing throughout the procedure under my direct supervision. CONTRAST:  None FLUOROSCOPY TIME:  30 seconds (15 mGy) COMPLICATIONS: None immediate. PROCEDURE: The procedure, risks, benefits, and alternatives were explained to the patient. Questions regarding the procedure were encouraged and answered. The patient understands and consents to the procedure. The right neck and chest were prepped with chlorhexidine in a sterile fashion, and a sterile drape was applied covering the operative field. Maximum barrier sterile technique with sterile gowns and gloves were used for the procedure. A timeout was performed prior to the initiation of the procedure. Local anesthesia was provided with 1% lidocaine with epinephrine. After creating a small venotomy incision, a micropuncture kit was utilized to access the internal jugular vein. Real-time ultrasound guidance was utilized for vascular access including the acquisition of a permanent ultrasound image documenting patency of the accessed vessel. The microwire was utilized to measure appropriate catheter length. A subcutaneous port pocket was then created along the upper chest wall utilizing a combination of sharp and blunt dissection. The pocket was irrigated with sterile saline. A single lumen ISP power injectable port was chosen for placement. The 8 Fr catheter was tunneled from the port pocket site to the venotomy incision. The port was placed in the pocket. The external catheter was trimmed to  appropriate length. At the venotomy, an 8 Fr peel-away sheath was placed over a guidewire under fluoroscopic guidance. The catheter  was then placed through the sheath and the sheath was removed. Final catheter positioning was confirmed and documented with a fluoroscopic spot radiograph. The port was accessed with a Huber needle, aspirated and flushed with heparinized saline. The venotomy site was closed with an interrupted 4-0 Vicryl suture. The port pocket incision was closed with interrupted 2-0 Vicryl suture and the skin was opposed with a running subcuticular 4-0 Vicryl suture. Dermabond and Steri-strips were applied to both incisions. Dressings were placed. The patient tolerated the procedure well without immediate post procedural complication. FINDINGS: After catheter placement, the tip lies within the superior cavoatrial junction. The catheter aspirates and flushes normally and is ready for immediate use. IMPRESSION: Successful placement of a right internal jugular approach power injectable Port-A-Cath. The catheter is ready for immediate use. Electronically Signed   By: Sandi Mariscal M.D.   On: 12/10/2017 18:20   Ir Fluoro Guide Port Insertion Right  Result Date: 12/10/2017 INDICATION: History of left-sided metastatic breast cancer. Please perform Port a catheter placement for durable intravenous access for chemotherapy administration. EXAM: IMPLANTED PORT A CATH PLACEMENT WITH ULTRASOUND AND FLUOROSCOPIC GUIDANCE COMPARISON:  PET-CT - 11/29/2017 MEDICATIONS: Ancef 2 gm IV; The antibiotic was administered within an appropriate time interval prior to skin puncture. ANESTHESIA/SEDATION: Moderate (conscious) sedation was employed during this procedure. A total of Versed 1.5 mg and Fentanyl 75 mcg was administered intravenously. Moderate Sedation Time: 27 minutes. The patient's level of consciousness and vital signs were monitored continuously by radiology nursing throughout the procedure under my direct  supervision. CONTRAST:  None FLUOROSCOPY TIME:  30 seconds (15 mGy) COMPLICATIONS: None immediate. PROCEDURE: The procedure, risks, benefits, and alternatives were explained to the patient. Questions regarding the procedure were encouraged and answered. The patient understands and consents to the procedure. The right neck and chest were prepped with chlorhexidine in a sterile fashion, and a sterile drape was applied covering the operative field. Maximum barrier sterile technique with sterile gowns and gloves were used for the procedure. A timeout was performed prior to the initiation of the procedure. Local anesthesia was provided with 1% lidocaine with epinephrine. After creating a small venotomy incision, a micropuncture kit was utilized to access the internal jugular vein. Real-time ultrasound guidance was utilized for vascular access including the acquisition of a permanent ultrasound image documenting patency of the accessed vessel. The microwire was utilized to measure appropriate catheter length. A subcutaneous port pocket was then created along the upper chest wall utilizing a combination of sharp and blunt dissection. The pocket was irrigated with sterile saline. A single lumen ISP power injectable port was chosen for placement. The 8 Fr catheter was tunneled from the port pocket site to the venotomy incision. The port was placed in the pocket. The external catheter was trimmed to appropriate length. At the venotomy, an 8 Fr peel-away sheath was placed over a guidewire under fluoroscopic guidance. The catheter was then placed through the sheath and the sheath was removed. Final catheter positioning was confirmed and documented with a fluoroscopic spot radiograph. The port was accessed with a Huber needle, aspirated and flushed with heparinized saline. The venotomy site was closed with an interrupted 4-0 Vicryl suture. The port pocket incision was closed with interrupted 2-0 Vicryl suture and the skin was  opposed with a running subcuticular 4-0 Vicryl suture. Dermabond and Steri-strips were applied to both incisions. Dressings were placed. The patient tolerated the procedure well without immediate post procedural complication. FINDINGS: After catheter placement, the tip lies within the  superior cavoatrial junction. The catheter aspirates and flushes normally and is ready for immediate use. IMPRESSION: Successful placement of a right internal jugular approach power injectable Port-A-Cath. The catheter is ready for immediate use. Electronically Signed   By: Sandi Mariscal M.D.   On: 12/10/2017 18:20      ASSESSMENT and PLAN:   Breast cancer of lower-outer quadrant of left female breast ([place]) Left breast invasive ductal carcinoma T3 N1 M0 status post mastectomy, Oncotype DX score 28 status post 4 cycles of Taxotere and Cytoxan followed by chest wall radiation and Arimidex 1 mg daily since March 2013  New lesions on CT A/P show liver, lower lung, and retroperitoneal lesions Liver biopsy: Poorly differentiated carcinoma ER PR negative  PET/CT scan: Metastatic disease involving bilateral lungs, hilar and mediastinal nodes, liver metastases extremely large size, retroperitoneal nodes  Plan: Because her tumor is ER PR negative, she will need systemic chemotherapy.  Today is day 1 cycle 1 of Eribulin.  This will be given on day 1 and 8 of a 21 day cycle. I recommended she pick up her anti emetics.  I again reviewed with her the possible side effects of the treatment with her.  She was given detailed info in her AVS as well.    She also saw Dr. Lindi Adie today due to her ascites.  As she is asymptomatic, we will monitor her abdomen and may need to intervene with paracentesis or drain placement if it progresses and she develops symptoms.    Quinesha will return in one week for labs, f/u with Dr. Lindi Adie, and cycle 1 day 8 of Eribulin therapy.   All questions were answered. The patient knows to call the clinic  with any problems, questions or concerns. We can certainly see the patient much sooner if necessary.  A total of (30) minutes of face-to-face time was spent with this patient with greater than 50% of that time in counseling and care-coordination.  This note was electronically signed. Scot Dock, NP 12/11/2017   Attending Note  I personally saw and examined Addison Bailey. The plan of care was discussed with her. I agree with the assessment and plan as documented above. Patient has moderate ascites that is not causing her symptoms.  I recommended watchful monitoring. She is starting chemotherapy and we will see her for toxicity evaluations. Signed Harriette Ohara, MD

## 2017-12-12 ENCOUNTER — Telehealth: Payer: Self-pay

## 2017-12-12 NOTE — Telephone Encounter (Signed)
Called and left VM for patient to follow up after first treatment yesterday. Left office number to call with any questions or concerns.  Cyndia Bent RN

## 2017-12-12 NOTE — Telephone Encounter (Signed)
-----   Message from Rennis Harding, RN sent at 12/11/2017 12:31 PM EST ----- Regarding: Dr. Lindi Adie 1st time chemo f/u 1st time chemo f/u

## 2017-12-19 ENCOUNTER — Inpatient Hospital Stay: Payer: Medicare Other

## 2017-12-19 ENCOUNTER — Inpatient Hospital Stay: Payer: Medicare Other | Attending: Hematology and Oncology

## 2017-12-19 ENCOUNTER — Other Ambulatory Visit: Payer: Self-pay | Admitting: Oncology

## 2017-12-19 ENCOUNTER — Inpatient Hospital Stay (HOSPITAL_BASED_OUTPATIENT_CLINIC_OR_DEPARTMENT_OTHER): Payer: Medicare Other | Admitting: Hematology and Oncology

## 2017-12-19 VITALS — BP 128/43 | HR 99 | Temp 98.2°F | Resp 16 | Ht 59.0 in | Wt 169.9 lb

## 2017-12-19 DIAGNOSIS — Z170 Estrogen receptor positive status [ER+]: Secondary | ICD-10-CM | POA: Diagnosis not present

## 2017-12-19 DIAGNOSIS — C7800 Secondary malignant neoplasm of unspecified lung: Secondary | ICD-10-CM

## 2017-12-19 DIAGNOSIS — C787 Secondary malignant neoplasm of liver and intrahepatic bile duct: Secondary | ICD-10-CM | POA: Diagnosis not present

## 2017-12-19 DIAGNOSIS — R110 Nausea: Secondary | ICD-10-CM | POA: Insufficient documentation

## 2017-12-19 DIAGNOSIS — Z95828 Presence of other vascular implants and grafts: Secondary | ICD-10-CM

## 2017-12-19 DIAGNOSIS — Z79811 Long term (current) use of aromatase inhibitors: Secondary | ICD-10-CM | POA: Diagnosis not present

## 2017-12-19 DIAGNOSIS — C50512 Malignant neoplasm of lower-outer quadrant of left female breast: Secondary | ICD-10-CM

## 2017-12-19 DIAGNOSIS — Z5111 Encounter for antineoplastic chemotherapy: Secondary | ICD-10-CM | POA: Insufficient documentation

## 2017-12-19 LAB — CBC WITH DIFFERENTIAL (CANCER CENTER ONLY)
BASOS ABS: 0 10*3/uL (ref 0.0–0.1)
Basophils Relative: 1 %
Eosinophils Absolute: 0 10*3/uL (ref 0.0–0.5)
Eosinophils Relative: 1 %
HEMATOCRIT: 35.4 % (ref 34.8–46.6)
Hemoglobin: 11.4 g/dL — ABNORMAL LOW (ref 11.6–15.9)
Lymphocytes Relative: 26 %
Lymphs Abs: 1.2 10*3/uL (ref 0.9–3.3)
MCH: 29.5 pg (ref 25.1–34.0)
MCHC: 32.2 g/dL (ref 31.5–36.0)
MCV: 91.7 fL (ref 79.5–101.0)
Monocytes Absolute: 0.7 10*3/uL (ref 0.1–0.9)
Monocytes Relative: 15 %
NEUTROS ABS: 2.7 10*3/uL (ref 1.5–6.5)
Neutrophils Relative %: 57 %
Platelet Count: 325 10*3/uL (ref 145–400)
RBC: 3.86 MIL/uL (ref 3.70–5.45)
RDW: 13.6 % (ref 11.2–14.5)
WBC: 4.6 10*3/uL (ref 3.9–10.3)

## 2017-12-19 LAB — CMP (CANCER CENTER ONLY)
ALBUMIN: 3 g/dL — AB (ref 3.5–5.0)
ALT: 15 U/L (ref 0–55)
ANION GAP: 9 (ref 3–11)
AST: 25 U/L (ref 5–34)
Alkaline Phosphatase: 92 U/L (ref 40–150)
BILIRUBIN TOTAL: 0.3 mg/dL (ref 0.2–1.2)
BUN: 8 mg/dL (ref 7–26)
CO2: 27 mmol/L (ref 22–29)
Calcium: 12.3 mg/dL — ABNORMAL HIGH (ref 8.4–10.4)
Chloride: 103 mmol/L (ref 98–109)
Creatinine: 1 mg/dL (ref 0.60–1.10)
GFR, EST NON AFRICAN AMERICAN: 55 mL/min — AB (ref >60)
GFR, Est AFR Am: >60 mL/min (ref >60)
GLUCOSE: 96 mg/dL (ref 70–140)
POTASSIUM: 4.2 mmol/L (ref 3.5–5.1)
Sodium: 139 mmol/L (ref 136–145)
TOTAL PROTEIN: 6.2 g/dL — AB (ref 6.4–8.3)

## 2017-12-19 MED ORDER — PALONOSETRON HCL INJECTION 0.25 MG/5ML
0.2500 mg | Freq: Once | INTRAVENOUS | Status: AC
  Administered 2017-12-19: 0.25 mg via INTRAVENOUS

## 2017-12-19 MED ORDER — SODIUM CHLORIDE 0.9% FLUSH
10.0000 mL | INTRAVENOUS | Status: DC | PRN
  Administered 2017-12-19: 10 mL via INTRAVENOUS
  Filled 2017-12-19: qty 10

## 2017-12-19 MED ORDER — HEPARIN SOD (PORK) LOCK FLUSH 100 UNIT/ML IV SOLN
500.0000 [IU] | Freq: Once | INTRAVENOUS | Status: DC
  Filled 2017-12-19: qty 5

## 2017-12-19 MED ORDER — PALONOSETRON HCL INJECTION 0.25 MG/5ML
INTRAVENOUS | Status: AC
  Filled 2017-12-19: qty 5

## 2017-12-19 MED ORDER — HEPARIN SOD (PORK) LOCK FLUSH 100 UNIT/ML IV SOLN
500.0000 [IU] | Freq: Once | INTRAVENOUS | Status: AC | PRN
  Administered 2017-12-19: 500 [IU]
  Filled 2017-12-19: qty 5

## 2017-12-19 MED ORDER — ZOLEDRONIC ACID 4 MG/100ML IV SOLN
4.0000 mg | Freq: Once | INTRAVENOUS | Status: AC
  Administered 2017-12-19: 4 mg via INTRAVENOUS
  Filled 2017-12-19: qty 100

## 2017-12-19 MED ORDER — SODIUM CHLORIDE 0.9% FLUSH
10.0000 mL | INTRAVENOUS | Status: DC | PRN
  Administered 2017-12-19: 10 mL
  Filled 2017-12-19: qty 10

## 2017-12-19 MED ORDER — SODIUM CHLORIDE 0.9 % IV SOLN
Freq: Once | INTRAVENOUS | Status: AC
  Administered 2017-12-19: 16:00:00 via INTRAVENOUS

## 2017-12-19 MED ORDER — SODIUM CHLORIDE 0.9 % IV SOLN
1.3800 mg/m2 | Freq: Once | INTRAVENOUS | Status: AC
  Administered 2017-12-19: 2.5 mg via INTRAVENOUS
  Filled 2017-12-19: qty 5

## 2017-12-19 NOTE — Progress Notes (Signed)
Mccaffrey's calcium was elevated today at greater than 12.  Her creatinine is 1.0.  I wrote to give her zolendronate 4 mg as well as a liter of normal saline with Lasix given halfway through the liter.

## 2017-12-19 NOTE — Progress Notes (Signed)
Pt to receive 1L NS and Zometa with treatment today per Jonelle Sidle, RN, per MD Magrinat

## 2017-12-19 NOTE — Assessment & Plan Note (Signed)
Left breast invasive ductal carcinoma T3 N1 M0 status post mastectomy, Oncotype DX score 28 status post 4 cycles of Taxotere and Cytoxan followed by chest wall radiation and Arimidex 1 mg daily since March 2013  New lesions on CT A/P show liver, lower lung, and retroperitoneal lesions Liver biopsy: Poorly differentiated carcinoma ER PR negative  PET/CT scan: Metastatic disease involving bilateral lungs, hilar and mediastinal nodes, liver metastases extremely large size, retroperitoneal nodes  Plan: Cycle 1 day 8 eribulin  Chemo toxicities: 1.  Nausea: I will add Aloxi to her treatment regimen 2. Fatigue  Return to clinic in 2 weeks for cycle 2

## 2017-12-19 NOTE — Patient Instructions (Addendum)
[facility] Discharge Instructions for Patients Receiving Chemotherapy  Today you received the following chemotherapy agents [place]  To help prevent nausea and vomiting after your treatment, we encourage you to take your nausea medication as directed.   If you develop nausea and vomiting that is not controlled by your nausea medication, call the clinic.   BELOW ARE SYMPTOMS THAT SHOULD BE REPORTED IMMEDIATELY:  *FEVER GREATER THAN 100.5 F  *CHILLS WITH OR WITHOUT FEVER  NAUSEA AND VOMITING THAT IS NOT CONTROLLED WITH YOUR NAUSEA MEDICATION  *UNUSUAL SHORTNESS OF BREATH  *UNUSUAL BRUISING OR BLEEDING  TENDERNESS IN MOUTH AND THROAT WITH OR WITHOUT PRESENCE OF ULCERS  *URINARY PROBLEMS  *BOWEL PROBLEMS  UNUSUAL RASH Items with * indicate a potential emergency and should be followed up as soon as possible.  Feel free to call the clinic should you have any questions or concerns. The clinic phone number is [phone].  Please show the [facility] at check-in to the Emergency Department and triage nurse.  Zoledronic Acid injection (Hypercalcemia, Oncology) What is this medicine? ZOLEDRONIC ACID (ZOE le dron ik AS id) lowers the amount of calcium loss from bone. It is used to treat too much calcium in your blood from cancer. It is also used to prevent complications of cancer that has spread to the bone. This medicine may be used for other purposes; ask your health care provider or pharmacist if you have questions. COMMON BRAND NAME(S): Zometa What should I tell my health care provider before I take this medicine? They need to know if you have any of these conditions: -aspirin-sensitive asthma -cancer, especially if you are receiving medicines used to treat cancer -dental disease or wear dentures -infection -kidney disease -receiving corticosteroids like dexamethasone or prednisone -an unusual or allergic reaction to zoledronic acid, other medicines,  foods, dyes, or preservatives -pregnant or trying to get pregnant -breast-feeding How should I use this medicine? This medicine is for infusion into a vein. It is given by a health care professional in a hospital or clinic setting. Talk to your pediatrician regarding the use of this medicine in children. Special care may be needed. Overdosage: If you think you have taken too much of this medicine contact a poison control center or emergency room at once. NOTE: This medicine is only for you. Do not share this medicine with others. What if I miss a dose? It is important not to miss your dose. Call your doctor or health care professional if you are unable to keep an appointment. What may interact with this medicine? -certain antibiotics given by injection -NSAIDs, medicines for pain and inflammation, like ibuprofen or naproxen -some diuretics like bumetanide, furosemide -teriparatide -thalidomide This list may not describe all possible interactions. Give your health care provider a list of all the medicines, herbs, non-prescription drugs, or dietary supplements you use. Also tell them if you smoke, drink alcohol, or use illegal drugs. Some items may interact with your medicine. What should I watch for while using this medicine? Visit your doctor or health care professional for regular checkups. It may be some time before you see the benefit from this medicine. Do not stop taking your medicine unless your doctor tells you to. Your doctor may order blood tests or other tests to see how you are doing. Women should inform their doctor if they wish to become pregnant or think they might be pregnant. There is a potential for serious side effects to an unborn child. Talk to  health care professional or pharmacist for more information. You should make sure that you get enough calcium and vitamin D while you are taking this medicine. Discuss the foods you eat and the vitamins you take with your health  care professional. Some people who take this medicine have severe bone, joint, and/or muscle pain. This medicine may also increase your risk for jaw problems or a broken thigh bone. Tell your doctor right away if you have severe pain in your jaw, bones, joints, or muscles. Tell your doctor if you have any pain that does not go away or that gets worse. Tell your dentist and dental surgeon that you are taking this medicine. You should not have major dental surgery while on this medicine. See your dentist to have a dental exam and fix any dental problems before starting this medicine. Take good care of your teeth while on this medicine. Make sure you see your dentist for regular follow-up appointments. What side effects may I notice from receiving this medicine? Side effects that you should report to your doctor or health care professional as soon as possible: -allergic reactions like skin rash, itching or hives, swelling of the face, lips, or tongue -anxiety, confusion, or depression -breathing problems -changes in vision -eye pain -feeling faint or lightheaded, falls -jaw pain, especially after dental work -mouth sores -muscle cramps, stiffness, or weakness -redness, blistering, peeling or loosening of the skin, including inside the mouth -trouble passing urine or change in the amount of urine Side effects that usually do not require medical attention (report to your doctor or health care professional if they continue or are bothersome): -bone, joint, or muscle pain -constipation -diarrhea -fever -hair loss -irritation at site where injected -loss of appetite -nausea, vomiting -stomach upset -trouble sleeping -trouble swallowing -weak or tired This list may not describe all possible side effects. Call your doctor for medical advice about side effects. You may report side effects to FDA at 1-800-FDA-1088. Where should I keep my medicine? This drug is given in a hospital or clinic and  will not be stored at home. NOTE: This sheet is a summary. It may not cover all possible information. If you have questions about this medicine, talk to your doctor, pharmacist, or health care provider.  2018 Elsevier/Gold Standard (2014-02-27 14:19:39)    

## 2017-12-19 NOTE — Progress Notes (Signed)
Patient Care Team: [name], MD as PCP - General (Internal Medicine) [name], MD (Internal Medicine) Thea Silversmith, MD (Inactive) as Attending Physician (Radiation Oncology) Christene Slates, MD as Attending Physician (Radiology)  DIAGNOSIS:  Encounter Diagnoses  Name Primary?  . Liver metastasis ([place]) Yes  . Malignant neoplasm of lower-outer quadrant of left breast of female, estrogen receptor positive ([place])     SUMMARY OF ONCOLOGIC HISTORY:   Breast cancer of lower-outer quadrant of left female breast ([place])   06/07/2011 Surgery     left mastectomy : multiple foci of invasive ductal carcinoma grade 2 5.1 cm and 1 cm with high-grade DCIS, lymphovascular invasion , 1/7 Oscar lymph node ER 100%, PR and her percent, Ki-67 50%, HER-2 equivocal ratio 2 , Oncotype 28      06/20/2011 - 09/11/2011 Chemotherapy    TC X 4      10/08/2011 - 11/13/2011 Radiation Therapy    chest wall radiation therapy       12/25/2011 -  Anti-estrogen oral therapy    Arimidex 1 mg daily       11/12/2017 Relapse/Recurrence    CT scan demonstrates lesion in liver, lung, and retroperitoneal lymph node.        12/11/2017 -  Chemotherapy    Eribulin given on day 1 and day 8 of a 21 day cycle.        CHIEF COMPLIANT: Cycle 1 day 8 Halaven  INTERVAL HISTORY: Christina Horn is a 72 year old with above-mentioned some metastatic breast cancer that is ER PR negative and is currently receiving Halaven.  Today is cycle 1 day 8.  She tolerated cycle 1 fairly well.  She did have nausea for which she took nausea medication.  She also had poor taste.  REVIEW OF SYSTEMS:   Constitutional: Denies fevers, chills or abnormal weight loss Eyes: Denies blurriness of vision Ears, nose, mouth, throat, and face: Denies mucositis or sore throat Respiratory: Denies cough, dyspnea or wheezes Cardiovascular: Denies palpitation, chest discomfort Gastrointestinal:  Denies nausea, heartburn or change in bowel habits Skin:  Denies abnormal skin rashes Lymphatics: Denies new lymphadenopathy or easy bruising Neurological:Denies numbness, tingling or new weaknesses Behavioral/Psych: Mood is stable, no new changes  Extremities: No lower extremity edema All other systems were reviewed with the patient and are negative.  I have reviewed the past medical history, past surgical history, social history and family history with the patient and they are unchanged from previous note.  ALLERGIES:  has No Known Allergies.  MEDICATIONS:  Current Outpatient Medications  Medication Sig Dispense Refill  . amLODipine (NORVASC) 5 MG tablet Take 5 mg by mouth daily.      Marland Kitchen anastrozole (ARIMIDEX) 1 MG tablet Take 1 tablet (1 mg total) by mouth daily. 90 tablet 3  . aspirin 81 MG tablet Take 81 mg by mouth daily.      . citalopram (CELEXA) 20 MG tablet Take 20 mg by mouth daily.      . fish oil-omega-3 fatty acids 1000 MG capsule Take 2 g by mouth daily.      . INCRUSE ELLIPTA 62.5 MCG/INH AEPB Inhale 1 puff into the lungs daily.    Marland Kitchen KLOR-CON M20 20 MEQ tablet Take 1 tablet by mouth daily.    Marland Kitchen lidocaine-prilocaine (EMLA) cream Apply to affected area once (Patient not taking: Reported on 12/11/2017) 30 g 3  . LORazepam (ATIVAN) 0.5 MG tablet Take 1 tablet (0.5 mg total) by mouth at bedtime as needed (Nausea or  vomiting). (Patient not taking: Reported on 12/11/2017) 30 tablet 0  . losartan (COZAAR) 25 MG tablet     . Melatonin 3 MG CAPS Take by mouth.    . metFORMIN (GLUCOPHAGE) 500 MG tablet Take 500 mg by mouth 2 (two) times daily with a meal.    . ondansetron (ZOFRAN) 8 MG tablet Take 1 tablet (8 mg total) by mouth 2 (two) times daily as needed (Nausea or vomiting). (Patient not taking: Reported on 12/11/2017) 30 tablet 1  . oxybutynin (DITROPAN) 5 MG tablet daily.    . prochlorperazine (COMPAZINE) 10 MG tablet Take 1 tablet (10 mg total) by mouth every 6 (six) hours as needed (Nausea or vomiting). (Patient not taking: Reported on  12/11/2017) 30 tablet 1  . VITAMIN D, CHOLECALCIFEROL, PO Take by mouth.      Alveda Reasons STARTER PACK 15 & 20 MG TBPK Take 15 mg by mouth 2 (two) times daily.     No current facility-administered medications for this visit.     PHYSICAL EXAMINATION: ECOG PERFORMANCE STATUS: 1 - Symptomatic but completely ambulatory  Vitals:   12/19/17 1439  BP: (!) 128/43  Pulse: 99  Resp: 16  Temp: 98.2 F (36.8 C)  SpO2: 99%   Filed Weights   12/19/17 1439  Weight: 169 lb 14.4 oz (77.1 kg)    GENERAL:alert, no distress and comfortable SKIN: skin color, texture, turgor are normal, no rashes or significant lesions EYES: normal, Conjunctiva are pink and non-injected, sclera clear OROPHARYNX:no exudate, no erythema and lips, buccal mucosa, and tongue normal  NECK: supple, thyroid normal size, non-tender, without nodularity LYMPH:  no palpable lymphadenopathy in the cervical, axillary or inguinal LUNGS: clear to auscultation and percussion with normal breathing effort HEART: regular rate & rhythm and no murmurs and no lower extremity edema ABDOMEN:abdomen soft, non-tender and normal bowel sounds MUSCULOSKELETAL:no cyanosis of digits and no clubbing  NEURO: alert & oriented x 3 with fluent speech, no focal motor/sensory deficits EXTREMITIES: No lower extremity edema  LABORATORY DATA:  I have reviewed the data as listed CMP Latest Ref Rng & Units 12/11/2017 12/02/2017 11/20/2017  Glucose 70 - 140 mg/dL 158(H) 92 108(H)  BUN 7 - 26 mg/dL _0 Creatinine 0.60 - 1.10 mg/dL 0.75 0.83 0.74  Sodium 136 - 145 mmol/L 136 139 138  Potassium 3.5 - 5.1 mmol/L 3.3(L) 4.9 4.3  Chloride 98 - 109 mmol/L 105 103 103  CO2 22 - 29 mmol/L _1 Calcium 8.4 - 10.4 mg/dL 10.7(H) 12.8(H) 13.1(HH)  Total Protein 6.4 - 8.3 g/dL 6.0(L) - -  Total Bilirubin 0.2 - 1.2 mg/dL 0.6 - -  Alkaline Phos 40 - 150 U/L 85 - -  AST 5 - 34 U/L 31 - -  ALT 0 - 55 U/L 25 - -    Lab Results  Component Value Date   WBC  9.6 12/11/2017   HGB 12.3 12/10/2017   HCT 35.7 12/11/2017   MCV 94.9 12/11/2017   PLT 223 12/11/2017   NEUTROABS 7.9 (H) 12/11/2017    ASSESSMENT & PLAN:  Breast cancer of lower-outer quadrant of left female breast ([place]) Left breast invasive ductal carcinoma T3 N1 M0 status post mastectomy, Oncotype DX score 28 status post 4 cycles of Taxotere and Cytoxan followed by chest wall radiation and Arimidex 1 mg daily since March 2013  New lesions on CT A/P show liver, lower lung, and retroperitoneal lesions Liver biopsy: Poorly differentiated carcinoma ER PR negative  PET/CT scan: Metastatic disease involving bilateral lungs, hilar and mediastinal nodes, liver metastases extremely large size, retroperitoneal nodes  Plan: Cycle 1 day 8 eribulin  Chemo toxicities: 1.  Nausea: I will add Aloxi to her treatment regimen 2. Fatigue  Return to clinic in 2 weeks for cycle 2  I spent 25 minutes talking to the patient of which more than half was spent in counseling and coordination of care.  No orders of the defined types were placed in this encounter.  The patient has a good understanding of the overall plan. she agrees with it. she will call with any problems that may develop before the next visit here.   Harriette Ohara, MD 12/19/17

## 2017-12-27 DIAGNOSIS — K439 Ventral hernia without obstruction or gangrene: Secondary | ICD-10-CM | POA: Diagnosis not present

## 2017-12-27 DIAGNOSIS — C50919 Malignant neoplasm of unspecified site of unspecified female breast: Secondary | ICD-10-CM | POA: Diagnosis not present

## 2017-12-27 DIAGNOSIS — J301 Allergic rhinitis due to pollen: Secondary | ICD-10-CM | POA: Diagnosis not present

## 2018-01-01 ENCOUNTER — Inpatient Hospital Stay (HOSPITAL_BASED_OUTPATIENT_CLINIC_OR_DEPARTMENT_OTHER): Payer: Medicare Other | Admitting: Adult Health

## 2018-01-01 ENCOUNTER — Inpatient Hospital Stay: Payer: Medicare Other

## 2018-01-01 ENCOUNTER — Telehealth: Payer: Self-pay | Admitting: Adult Health

## 2018-01-01 ENCOUNTER — Encounter: Payer: Self-pay | Admitting: Adult Health

## 2018-01-01 DIAGNOSIS — Z5111 Encounter for antineoplastic chemotherapy: Secondary | ICD-10-CM | POA: Diagnosis not present

## 2018-01-01 DIAGNOSIS — C50512 Malignant neoplasm of lower-outer quadrant of left female breast: Secondary | ICD-10-CM | POA: Diagnosis not present

## 2018-01-01 DIAGNOSIS — C7800 Secondary malignant neoplasm of unspecified lung: Secondary | ICD-10-CM

## 2018-01-01 DIAGNOSIS — R110 Nausea: Secondary | ICD-10-CM | POA: Diagnosis not present

## 2018-01-01 DIAGNOSIS — Z170 Estrogen receptor positive status [ER+]: Secondary | ICD-10-CM

## 2018-01-01 DIAGNOSIS — C787 Secondary malignant neoplasm of liver and intrahepatic bile duct: Secondary | ICD-10-CM | POA: Diagnosis not present

## 2018-01-01 DIAGNOSIS — Z79811 Long term (current) use of aromatase inhibitors: Secondary | ICD-10-CM

## 2018-01-01 LAB — CMP (CANCER CENTER ONLY)
ALBUMIN: 3.2 g/dL — AB (ref 3.5–5.0)
ALT: 11 U/L (ref 0–55)
AST: 25 U/L (ref 5–34)
Alkaline Phosphatase: 70 U/L (ref 40–150)
Anion gap: 8 (ref 3–11)
BUN: 10 mg/dL (ref 7–26)
CHLORIDE: 101 mmol/L (ref 98–109)
CO2: 26 mmol/L (ref 22–29)
Calcium: 12.8 mg/dL — ABNORMAL HIGH (ref 8.4–10.4)
Creatinine: 0.93 mg/dL (ref 0.60–1.10)
GFR, Est AFR Am: >60 mL/min (ref >60)
GFR, Estimated: 60 mL/min — ABNORMAL LOW (ref >60)
GLUCOSE: 99 mg/dL (ref 70–140)
POTASSIUM: 3.8 mmol/L (ref 3.5–5.1)
Sodium: 135 mmol/L — ABNORMAL LOW (ref 136–145)
Total Bilirubin: 0.4 mg/dL (ref 0.2–1.2)
Total Protein: 6.1 g/dL — ABNORMAL LOW (ref 6.4–8.3)

## 2018-01-01 LAB — CBC WITH DIFFERENTIAL (CANCER CENTER ONLY)
BASOS ABS: 0 10*3/uL (ref 0.0–0.1)
BASOS PCT: 1 %
EOS PCT: 0 %
Eosinophils Absolute: 0 10*3/uL (ref 0.0–0.5)
HEMATOCRIT: 37.1 % (ref 34.8–46.6)
Hemoglobin: 12 g/dL (ref 11.6–15.9)
Lymphocytes Relative: 22 %
Lymphs Abs: 1.2 10*3/uL (ref 0.9–3.3)
MCH: 29.4 pg (ref 25.1–34.0)
MCHC: 32.4 g/dL (ref 31.5–36.0)
MCV: 91 fL (ref 79.5–101.0)
MONO ABS: 0.8 10*3/uL (ref 0.1–0.9)
Monocytes Relative: 14 %
Neutro Abs: 3.6 10*3/uL (ref 1.5–6.5)
Neutrophils Relative %: 63 %
PLATELETS: 195 10*3/uL (ref 145–400)
RBC: 4.08 MIL/uL (ref 3.70–5.45)
RDW: 14.4 % (ref 11.2–14.5)
WBC Count: 5.6 10*3/uL (ref 3.9–10.3)

## 2018-01-01 MED ORDER — SODIUM CHLORIDE 0.9 % IV SOLN
Freq: Once | INTRAVENOUS | Status: AC
  Administered 2018-01-01: 15:00:00 via INTRAVENOUS

## 2018-01-01 MED ORDER — PALONOSETRON HCL INJECTION 0.25 MG/5ML
0.2500 mg | Freq: Once | INTRAVENOUS | Status: AC
  Administered 2018-01-01: 0.25 mg via INTRAVENOUS

## 2018-01-01 MED ORDER — SODIUM CHLORIDE 0.9% FLUSH
10.0000 mL | INTRAVENOUS | Status: DC | PRN
  Administered 2018-01-01: 10 mL
  Filled 2018-01-01: qty 10

## 2018-01-01 MED ORDER — HEPARIN SOD (PORK) LOCK FLUSH 100 UNIT/ML IV SOLN
500.0000 [IU] | Freq: Once | INTRAVENOUS | Status: AC | PRN
  Administered 2018-01-01: 500 [IU]
  Filled 2018-01-01: qty 5

## 2018-01-01 MED ORDER — SODIUM CHLORIDE 0.9% FLUSH
10.0000 mL | Freq: Once | INTRAVENOUS | Status: AC | PRN
  Administered 2018-01-01: 10 mL
  Filled 2018-01-01: qty 10

## 2018-01-01 MED ORDER — PALONOSETRON HCL INJECTION 0.25 MG/5ML
INTRAVENOUS | Status: AC
  Filled 2018-01-01: qty 5

## 2018-01-01 MED ORDER — SODIUM CHLORIDE 0.9 % IV SOLN
1.3800 mg/m2 | Freq: Once | INTRAVENOUS | Status: AC
  Administered 2018-01-01: 2.5 mg via INTRAVENOUS
  Filled 2018-01-01: qty 5

## 2018-01-01 MED ORDER — ZOLEDRONIC ACID 4 MG/100ML IV SOLN
4.0000 mg | Freq: Once | INTRAVENOUS | Status: AC
  Administered 2018-01-01: 4 mg via INTRAVENOUS
  Filled 2018-01-01: qty 100

## 2018-01-01 NOTE — Assessment & Plan Note (Addendum)
Left breast invasive ductal carcinoma T3 N1 M0 status post mastectomy, Oncotype DX score 28 status post 4 cycles of Taxotere and Cytoxan followed by chest wall radiation and Arimidex 1 mg daily since March 2013  New lesions on CT A/P show liver, lower lung, and retroperitoneal lesions Liver biopsy: Poorly differentiated carcinoma ER PR negative  PET/CT scan: Metastatic disease involving bilateral lungs, hilar and mediastinal nodes, liver metastases extremely large size, retroperitoneal nodes  Plan: Cycle 2 day 1 eribulin  Chemo toxicities: 1. Nausea: Improved 2. Fatigue 3. Weight loss: Counseled her on supplementing her diet with Glucerna or Ensure BID.  She may need nutrition evaluation if it continues.    She will return next week for cycle 2 day 8 Eribulin, we will see her back in 3 weeks for labs, follow up and cycle 3 day 1 of treatment.  She will undergo repeat scans after 3 cycles of treatment.

## 2018-01-01 NOTE — Progress Notes (Signed)
Intervention:  Corrected Ca++ 13.44  Contacted MD give dose of Zometa 4 mg IVPB today.  T.O. Dr Vanessa Kick  Henreitta Leber, PharmD

## 2018-01-01 NOTE — Telephone Encounter (Signed)
Gave patient AVs and calendar of upcoming march and April appointments.  °

## 2018-01-01 NOTE — Progress Notes (Addendum)
[facility] Follow up:    Christina Horn, [address] [place] [address]   DIAGNOSIS: Cancer Staging No matching staging information was found for the patient.  SUMMARY OF ONCOLOGIC HISTORY:   Breast cancer of lower-outer quadrant of left female breast ([place])   06/07/2011 Surgery     left mastectomy : multiple foci of invasive ductal carcinoma grade 2 5.1 cm and 1 cm with high-grade DCIS, lymphovascular invasion , 1/7 Oscar lymph node ER 100%, PR and her percent, Ki-67 50%, HER-2 equivocal ratio 2 , Oncotype 28      06/20/2011 - 09/11/2011 Chemotherapy    TC X 4      10/08/2011 - 11/13/2011 Radiation Therapy    chest wall radiation therapy       12/25/2011 -  Anti-estrogen oral therapy    Arimidex 1 mg daily       11/12/2017 Relapse/Recurrence    CT scan demonstrates lesion in liver, lung, and retroperitoneal lymph node.        12/11/2017 -  Chemotherapy    Eribulin given on day 1 and day 8 of a 21 day cycle.        CURRENT THERAPY: Eribulin cycle 2 day 1  INTERVAL HISTORY: Christina Horn 72 y.o. female returns for evaluation prior to receiving her second cycle of chemotherapy with Eribulin. She receives treatment on day 1 and day 8 of a 21 day cycle.  She has noted some taste changes and her hair coming out.  She continues to have upper abdominal discomfort.  She has an upcoming appt with Dr. Johney Maine.  She denies any concerns or new pain/uncontrolled symptoms today.    Patient Active Problem List   Diagnosis Date Noted  . Hypercalcemia 11/20/2017  . Liver metastasis ([place]) 11/14/2017  . Lung metastases ([place]) 11/14/2017  . Depression with anxiety 08/18/2016  . Dyspnea 08/18/2016  . Non-insulin dependent type 2 diabetes mellitus ([place]) 08/18/2016  . Hypertension 08/18/2016  . OSA on CPAP 08/18/2016  . Kidney disease 08/18/2016  . SOB (shortness of breath) 08/18/2016  . COPD (chronic obstructive pulmonary disease) ([place]) 08/18/2016   . Elevated troponin 08/18/2016  . Osteopenia 10/27/2015  . Breast cancer of lower-outer quadrant of left female breast ([place]) 05/09/2011    has No Known Allergies.  MEDICAL HISTORY: Past Medical History:  Diagnosis Date  . Anxiety    h/o anxiety attack  . Arthritis    back meds not required  . Breast cancer ([place])    left breast  . Breast cancer, left breast ([place]) 05/09/2011  . COPD (chronic obstructive pulmonary disease) ([place])   . Diabetes mellitus    borderline  . Hypertension   . Pneumonia   . Sleep apnea    Uses CPAP    SURGICAL HISTORY: Past Surgical History:  Procedure Laterality Date  . ABDOMINAL HYSTERECTOMY    . BREAST SURGERY    . CHOLECYSTECTOMY    . IR FLUORO GUIDE PORT INSERTION RIGHT  12/10/2017  . IR US GUIDE VASC ACCESS RIGHT  12/10/2017  . left leg vein striping    . MASTECTOMY  06/07/11   left   . TRACHEOSTOMY    . VASCULAR SURGERY      SOCIAL HISTORY: Social History   Socioeconomic History  . Marital status: Widowed    Spouse name: Not on file  . Number of children: Not on file  . Years of education: Not on file  . Highest education  level: Not on file  Social Needs  . Financial resource strain: Not on file  . Food insecurity - worry: Not on file  . Food insecurity - inability: Not on file  . Transportation needs - medical: Not on file  . Transportation needs - non-medical: Not on file  Occupational History  . Not on file  Tobacco Use  . Smoking status: Former Smoker    Packs/day: 1.50    Years: 25.00    Pack years: 37.50    Types: Cigarettes    Last attempt to quit: 10/19/2001    Years since quitting: 16.2  . Smokeless tobacco: Never Used  Substance and Sexual Activity  . Alcohol use: No  . Drug use: No  . Sexual activity: Not Currently  Other Topics Concern  . Not on file  Social History Narrative  . Not on file    FAMILY HISTORY: Family History  Problem Relation Age of Onset  . Cancer Mother        breast  . Cancer  Sister        lung  . Cancer Maternal Aunt        lung  . Cancer Maternal Grandmother        unknown  . Hypertension Sister     Review of Systems  Constitutional: Negative for appetite change, chills, fatigue, fever and unexpected weight change.  HENT:   Negative for hearing loss, lump/mass and trouble swallowing.   Eyes: Negative for eye problems and icterus.  Respiratory: Negative for chest tightness, cough and shortness of breath.   Cardiovascular: Negative for chest pain, leg swelling and palpitations.  Gastrointestinal: Positive for abdominal distention and nausea. Negative for abdominal pain, constipation, diarrhea and vomiting.  Endocrine: Negative for hot flashes.  Musculoskeletal: Negative for arthralgias.  Skin: Negative for itching and rash.  Neurological: Negative for dizziness, extremity weakness, headaches and numbness.  Hematological: Negative for adenopathy. Does not bruise/bleed easily.  Psychiatric/Behavioral: Negative for depression. The patient is not nervous/anxious.       PHYSICAL EXAMINATION  ECOG PERFORMANCE STATUS: 2 - Symptomatic, <50% confined to bed  Vitals:   01/01/18 1348  BP: (!) 144/60  Pulse: 93  Resp: 18  Temp: 98.4 F (36.9 C)  SpO2: 98%    Physical Exam  Constitutional: She is oriented to person, place, and time and well-developed, well-nourished, and in no distress.  Older woman, appears chronically ill  HENT:  Head: Normocephalic and atraumatic.  Mouth/Throat: Oropharynx is clear and moist. No oropharyngeal exudate.  Eyes: Pupils are equal, round, and reactive to light. No scleral icterus.  Neck: Neck supple.  Cardiovascular: Normal rate, regular rhythm and normal heart sounds.  Pulmonary/Chest: Effort normal and breath sounds normal. No respiratory distress. She has no wheezes. She has no rales.  Abdominal: Soft. She exhibits distension. She exhibits no mass. There is no tenderness. There is no rebound and no guarding.  +  ascites present  Musculoskeletal: She exhibits no edema.  Lymphadenopathy:    She has no cervical adenopathy.  Neurological: She is alert and oriented to person, place, and time.  Skin: Skin is warm and dry. No rash noted. No erythema.  Psychiatric: Mood and affect normal.    LABORATORY DATA:  CBC    Component Value Date/Time   WBC 5.6 01/01/2018 1250   WBC 8.0 12/10/2017 1247   RBC 4.08 01/01/2018 1250   HGB 12.3 12/10/2017 1247   HGB 12.2 10/25/2016 1211   HCT 37.1 01/01/2018 1250  HCT 37.2 10/25/2016 1211   PLT 195 01/01/2018 1250   PLT 274 10/25/2016 1211   MCV 91.0 01/01/2018 1250   MCV 94.7 10/25/2016 1211   MCH 29.4 01/01/2018 1250   MCHC 32.4 01/01/2018 1250   RDW 14.4 01/01/2018 1250   RDW 13.3 10/25/2016 1211   LYMPHSABS 1.2 01/01/2018 1250   LYMPHSABS 1.7 10/25/2016 1211   MONOABS 0.8 01/01/2018 1250   MONOABS 0.6 10/25/2016 1211   EOSABS 0.0 01/01/2018 1250   EOSABS 0.2 10/25/2016 1211   BASOSABS 0.0 01/01/2018 1250   BASOSABS 0.1 10/25/2016 1211    CMP     Component Value Date/Time   NA 135 (L) 01/01/2018 1250   NA 143 10/25/2016 1211   K 3.8 01/01/2018 1250   K 4.7 10/25/2016 1211   CL 101 01/01/2018 1250   CL 100 07/30/2012 0939   CO2 26 01/01/2018 1250   CO2 28 10/25/2016 1211   GLUCOSE 99 01/01/2018 1250   GLUCOSE 108 10/25/2016 1211   GLUCOSE 167 (H) 07/30/2012 0939   BUN 10 01/01/2018 1250   BUN 13.4 10/25/2016 1211   CREATININE 0.93 01/01/2018 1250   CREATININE 0.8 10/25/2016 1211   CALCIUM 12.8 (H) 01/01/2018 1250   CALCIUM 9.4 10/25/2016 1211   PROT 6.1 (L) 01/01/2018 1250   PROT 6.4 10/25/2016 1211   ALBUMIN 3.2 (L) 01/01/2018 1250   ALBUMIN 3.4 (L) 10/25/2016 1211   AST 25 01/01/2018 1250   AST 18 10/25/2016 1211   ALT 11 01/01/2018 1250   ALT 21 10/25/2016 1211   ALKPHOS 70 01/01/2018 1250   ALKPHOS 55 10/25/2016 1211   BILITOT 0.4 01/01/2018 1250   BILITOT 0.41 10/25/2016 1211   GFRNONAA 60 (L) 01/01/2018 1250   GFRAA  >60 01/01/2018 1250     ASSESSMENT and PLAN:   Breast cancer of lower-outer quadrant of left female breast ([place]) Left breast invasive ductal carcinoma T3 N1 M0 status post mastectomy, Oncotype DX score 28 status post 4 cycles of Taxotere and Cytoxan followed by chest wall radiation and Arimidex 1 mg daily since March 2013  New lesions on CT A/P show liver, lower lung, and retroperitoneal lesions Liver biopsy: Poorly differentiated carcinoma ER PR negative  PET/CT scan: Metastatic disease involving bilateral lungs, hilar and mediastinal nodes, liver metastases extremely large size, retroperitoneal nodes  Plan: Cycle  2 day 1 eribulin  Chemo toxicities: 1. Nausea:  Improved 2. Fatigue 3. Weight loss: Counseled her on supplementing her diet with Glucerna or Ensure BID.  She may need nutrition evaluation if it continues.    She will return next week for cycle 2 day 8 Eribulin, we will see her back in 3 weeks for labs, follow up and cycle 3 day 1 of treatment.  She will undergo repeat scans after 3 cycles of treatment.      All questions were answered. The patient knows to call the clinic with any problems, questions or concerns. We can certainly see the patient much sooner if necessary.  A total of (30) minutes of face-to-face time was spent with this patient with greater than 50% of that time in counseling and care-coordination.  This note was electronically signed. Scot Dock, NP 01/01/2018   Attending Note  I personally saw and examined Christina Horn. The plan of care was discussed with her. I agree with the assessment and plan as documented above. Patient has hypercalcemia corrected calcium was 13.5: She received a dose of Zometa earlier this month.  We plan to give another dose of Zometa today. Monitoring closely for chemo toxicities.  She has nausea related to chemotherapy which is being well controlled with antiemetics. Lack of taste causing her to not eat well which  is causing some weight loss issues.  I discussed with her about taking Ensure. Return to clinic every 3 weeks for follow-up. Signed Harriette Ohara, MD

## 2018-01-01 NOTE — Patient Instructions (Addendum)
[facility] Discharge Instructions for Patients Receiving Chemotherapy  Today you received the following chemotherapy agents: Eriblulin mesylate ([place]).  To help prevent nausea and vomiting after your treatment, we encourage you to take your nausea medication as prescribed. Received Aloxi during treatment today-->take Compazine (not Zofran) for the next 3 days as needed. If you develop nausea and vomiting that is not controlled by your nausea medication, call the clinic.   BELOW ARE SYMPTOMS THAT SHOULD BE REPORTED IMMEDIATELY:  *FEVER GREATER THAN 100.5 F  *CHILLS WITH OR WITHOUT FEVER  NAUSEA AND VOMITING THAT IS NOT CONTROLLED WITH YOUR NAUSEA MEDICATION  *UNUSUAL SHORTNESS OF BREATH  *UNUSUAL BRUISING OR BLEEDING  TENDERNESS IN MOUTH AND THROAT WITH OR WITHOUT PRESENCE OF ULCERS  *URINARY PROBLEMS  *BOWEL PROBLEMS  UNUSUAL RASH Items with * indicate a potential emergency and should be followed up as soon as possible.  Feel free to call the clinic should you have any questions or concerns. The clinic phone number is [phone].  Please show the [place] [facility] at check-in to the Emergency Department and triage nurse.  Zoledronic Acid injection (Hypercalcemia, Oncology) What is this medicine? ZOLEDRONIC ACID (ZOE le dron ik AS id) lowers the amount of calcium loss from bone. It is used to treat too much calcium in your blood from cancer. It is also used to prevent complications of cancer that has spread to the bone. This medicine may be used for other purposes; ask your health care provider or pharmacist if you have questions. COMMON BRAND NAME(S): Zometa What should I tell my health care provider before I take this medicine? They need to know if you have any of these conditions: -aspirin-sensitive asthma -cancer, especially if you are receiving medicines used to treat cancer -dental disease or wear dentures -infection -kidney disease -receiving  corticosteroids like dexamethasone or prednisone -an unusual or allergic reaction to zoledronic acid, other medicines, foods, dyes, or preservatives -pregnant or trying to get pregnant -breast-feeding How should I use this medicine? This medicine is for infusion into a vein. It is given by a health care professional in a hospital or clinic setting. Talk to your pediatrician regarding the use of this medicine in children. Special care may be needed. Overdosage: If you think you have taken too much of this medicine contact a poison control center or emergency room at once. NOTE: This medicine is only for you. Do not share this medicine with others. What if I miss a dose? It is important not to miss your dose. Call your doctor or health care professional if you are unable to keep an appointment. What may interact with this medicine? -certain antibiotics given by injection -NSAIDs, medicines for pain and inflammation, like ibuprofen or naproxen -some diuretics like bumetanide, furosemide -teriparatide -thalidomide This list may not describe all possible interactions. Give your health care provider a list of all the medicines, herbs, non-prescription drugs, or dietary supplements you use. Also tell them if you smoke, drink alcohol, or use illegal drugs. Some items may interact with your medicine. What should I watch for while using this medicine? Visit your doctor or health care professional for regular checkups. It may be some time before you see the benefit from this medicine. Do not stop taking your medicine unless your doctor tells you to. Your doctor may order blood tests or other tests to see how you are doing. Women should inform their doctor if they wish to become pregnant or think they might be  pregnant. There is a potential for serious side effects to an unborn child. Talk to your health care professional or pharmacist for more information. You should make sure that you get enough calcium  and vitamin D while you are taking this medicine. Discuss the foods you eat and the vitamins you take with your health care professional. Some people who take this medicine have severe bone, joint, and/or muscle pain. This medicine may also increase your risk for jaw problems or a broken thigh bone. Tell your doctor right away if you have severe pain in your jaw, bones, joints, or muscles. Tell your doctor if you have any pain that does not go away or that gets worse. Tell your dentist and dental surgeon that you are taking this medicine. You should not have major dental surgery while on this medicine. See your dentist to have a dental exam and fix any dental problems before starting this medicine. Take good care of your teeth while on this medicine. Make sure you see your dentist for regular follow-up appointments. What side effects may I notice from receiving this medicine? Side effects that you should report to your doctor or health care professional as soon as possible: -allergic reactions like skin rash, itching or hives, swelling of the face, lips, or tongue -anxiety, confusion, or depression -breathing problems -changes in vision -eye pain -feeling faint or lightheaded, falls -jaw pain, especially after dental work -mouth sores -muscle cramps, stiffness, or weakness -redness, blistering, peeling or loosening of the skin, including inside the mouth -trouble passing urine or change in the amount of urine Side effects that usually do not require medical attention (report to your doctor or health care professional if they continue or are bothersome): -bone, joint, or muscle pain -constipation -diarrhea -fever -hair loss -irritation at site where injected -loss of appetite -nausea, vomiting -stomach upset -trouble sleeping -trouble swallowing -weak or tired This list may not describe all possible side effects. Call your doctor for medical advice about side effects. You may report side  effects to FDA at 1-800-FDA-1088. Where should I keep my medicine? This drug is given in a hospital or clinic and will not be stored at home. NOTE: This sheet is a summary. It may not cover all possible information. If you have questions about this medicine, talk to your doctor, pharmacist, or health care provider.  2018 Elsevier/Gold Standard (2014-02-27 14:19:39)

## 2018-01-07 DIAGNOSIS — C50919 Malignant neoplasm of unspecified site of unspecified female breast: Secondary | ICD-10-CM | POA: Diagnosis not present

## 2018-01-07 DIAGNOSIS — C787 Secondary malignant neoplasm of liver and intrahepatic bile duct: Secondary | ICD-10-CM | POA: Diagnosis not present

## 2018-01-07 DIAGNOSIS — K429 Umbilical hernia without obstruction or gangrene: Secondary | ICD-10-CM | POA: Diagnosis not present

## 2018-01-09 ENCOUNTER — Inpatient Hospital Stay: Payer: Medicare Other

## 2018-01-09 ENCOUNTER — Other Ambulatory Visit: Payer: Self-pay

## 2018-01-09 VITALS — BP 132/80 | HR 98 | Temp 97.9°F | Resp 18

## 2018-01-09 DIAGNOSIS — C50512 Malignant neoplasm of lower-outer quadrant of left female breast: Secondary | ICD-10-CM

## 2018-01-09 DIAGNOSIS — Z79811 Long term (current) use of aromatase inhibitors: Secondary | ICD-10-CM | POA: Diagnosis not present

## 2018-01-09 DIAGNOSIS — Z170 Estrogen receptor positive status [ER+]: Secondary | ICD-10-CM

## 2018-01-09 DIAGNOSIS — R110 Nausea: Secondary | ICD-10-CM | POA: Diagnosis not present

## 2018-01-09 DIAGNOSIS — C7800 Secondary malignant neoplasm of unspecified lung: Secondary | ICD-10-CM

## 2018-01-09 DIAGNOSIS — C787 Secondary malignant neoplasm of liver and intrahepatic bile duct: Secondary | ICD-10-CM | POA: Diagnosis not present

## 2018-01-09 DIAGNOSIS — Z5111 Encounter for antineoplastic chemotherapy: Secondary | ICD-10-CM | POA: Diagnosis not present

## 2018-01-09 LAB — CMP (CANCER CENTER ONLY)
ALK PHOS: 79 U/L (ref 40–150)
ALT: 27 U/L (ref 0–55)
AST: 45 U/L — ABNORMAL HIGH (ref 5–34)
Albumin: 3.3 g/dL — ABNORMAL LOW (ref 3.5–5.0)
Anion gap: 7 (ref 3–11)
BILIRUBIN TOTAL: 0.5 mg/dL (ref 0.2–1.2)
BUN: 13 mg/dL (ref 7–26)
CALCIUM: 14.6 mg/dL — AB (ref 8.4–10.4)
CO2: 27 mmol/L (ref 22–29)
CREATININE: 0.85 mg/dL (ref 0.60–1.10)
Chloride: 103 mmol/L (ref 98–109)
GFR, Est AFR Am: >60 mL/min (ref >60)
Glucose, Bld: 94 mg/dL (ref 70–140)
Potassium: 4.3 mmol/L (ref 3.5–5.1)
Sodium: 137 mmol/L (ref 136–145)
TOTAL PROTEIN: 6.2 g/dL — AB (ref 6.4–8.3)

## 2018-01-09 LAB — CBC WITH DIFFERENTIAL (CANCER CENTER ONLY)
BASOS PCT: 1 %
Basophils Absolute: 0 10*3/uL (ref 0.0–0.1)
EOS ABS: 0 10*3/uL (ref 0.0–0.5)
EOS PCT: 0 %
HCT: 35 % (ref 34.8–46.6)
Hemoglobin: 11.6 g/dL (ref 11.6–15.9)
Lymphocytes Relative: 16 %
Lymphs Abs: 0.9 10*3/uL (ref 0.9–3.3)
MCH: 30.1 pg (ref 25.1–34.0)
MCHC: 33.1 g/dL (ref 31.5–36.0)
MCV: 91.1 fL (ref 79.5–101.0)
Monocytes Absolute: 0.5 10*3/uL (ref 0.1–0.9)
Monocytes Relative: 9 %
NEUTROS PCT: 74 %
Neutro Abs: 4.1 10*3/uL (ref 1.5–6.5)
PLATELETS: 213 10*3/uL (ref 145–400)
RBC: 3.84 MIL/uL (ref 3.70–5.45)
RDW: 14.9 % — ABNORMAL HIGH (ref 11.2–14.5)
WBC: 5.6 10*3/uL (ref 3.9–10.3)

## 2018-01-09 MED ORDER — ZOLEDRONIC ACID 4 MG/100ML IV SOLN
4.0000 mg | Freq: Once | INTRAVENOUS | Status: AC
  Administered 2018-01-09: 4 mg via INTRAVENOUS
  Filled 2018-01-09: qty 100

## 2018-01-09 MED ORDER — SODIUM CHLORIDE 0.9 % IV SOLN
INTRAVENOUS | Status: DC
  Administered 2018-01-09: 11:00:00 via INTRAVENOUS

## 2018-01-09 MED ORDER — SODIUM CHLORIDE 0.9 % IV SOLN: Freq: Once | INTRAVENOUS | Status: DC

## 2018-01-09 MED ORDER — SODIUM CHLORIDE 0.9 % IV SOLN
1.3800 mg/m2 | Freq: Once | INTRAVENOUS | Status: AC
  Administered 2018-01-09: 2.5 mg via INTRAVENOUS
  Filled 2018-01-09: qty 5

## 2018-01-09 MED ORDER — PALONOSETRON HCL INJECTION 0.25 MG/5ML
0.2500 mg | Freq: Once | INTRAVENOUS | Status: AC
  Administered 2018-01-09: 0.25 mg via INTRAVENOUS

## 2018-01-09 MED ORDER — SODIUM CHLORIDE 0.9% FLUSH
10.0000 mL | INTRAVENOUS | Status: DC | PRN
  Administered 2018-01-09: 10 mL
  Filled 2018-01-09: qty 10

## 2018-01-09 MED ORDER — HEPARIN SOD (PORK) LOCK FLUSH 100 UNIT/ML IV SOLN
500.0000 [IU] | Freq: Once | INTRAVENOUS | Status: AC | PRN
  Administered 2018-01-09: 500 [IU]
  Filled 2018-01-09: qty 5

## 2018-01-09 MED ORDER — PALONOSETRON HCL INJECTION 0.25 MG/5ML
INTRAVENOUS | Status: AC
  Filled 2018-01-09: qty 5

## 2018-01-09 NOTE — Patient Instructions (Signed)
[facility] Discharge Instructions for Patients Receiving Chemotherapy  Today you received the following chemotherapy agents: Eriblulin mesylate (Halaven) & Zometa To help prevent nausea and vomiting after your treatment, we encourage you to take your nausea medication as prescribed. Received Aloxi during treatment today-->take Compazine (not Zofran) for the next 3 days as needed. If you develop nausea and vomiting that is not controlled by your nausea medication, call the clinic.   BELOW ARE SYMPTOMS THAT SHOULD BE REPORTED IMMEDIATELY:  *FEVER GREATER THAN 100.5 F  *CHILLS WITH OR WITHOUT FEVER  NAUSEA AND VOMITING THAT IS NOT CONTROLLED WITH YOUR NAUSEA MEDICATION  *UNUSUAL SHORTNESS OF BREATH  *UNUSUAL BRUISING OR BLEEDING  TENDERNESS IN MOUTH AND THROAT WITH OR WITHOUT PRESENCE OF ULCERS  *URINARY PROBLEMS  *BOWEL PROBLEMS  UNUSUAL RASH Items with * indicate a potential emergency and should be followed up as soon as possible.  Feel free to call the clinic should you have any questions or concerns. The clinic phone number is [phone].  Please show the [place] at check-in to the Emergency Department and triage nurse.

## 2018-01-09 NOTE — Progress Notes (Signed)
Received call for critical calcium 14.9. Notified Dr.Gudena. Pt to receive zometa today with Halaven treatment and add IVF's 1L. Notified pharmacy and infusion RN.

## 2018-01-15 ENCOUNTER — Telehealth: Payer: Self-pay

## 2018-01-15 NOTE — Telephone Encounter (Signed)
Received message from patients son. Returned call and left VM for Kennyth Lose to call back.  Cyndia Bent RN

## 2018-01-16 ENCOUNTER — Telehealth: Payer: Self-pay

## 2018-01-16 NOTE — Telephone Encounter (Signed)
Christina Horn, patients son stated his mom has been extremely weak this past week and has had multiple falls. Informed him she will most likely need home health physical therapy to work on strength and may need to be seen by [facility] or Lucianne Lei. Will speak with Dr. Lindi Adie and returns Jackie's call with more information.  Cyndia Bent RN

## 2018-01-16 NOTE — Telephone Encounter (Signed)
Christina Horn returned my call and he is unable to bring her today. Earliest he can bring her is tomorrow afternoon because his wife has a treatment tomorrow. Attentively set an appt for tomorrow afternoon at 3p with [name], NP. If unable to make it he will let me know. He is worried if she is too weak to get out of the house. I suggested that if this is the case she may need to go to the emergency room to be evaluated. He has understanding. He will call and let me know what they decide. Cyndia Bent RN

## 2018-01-17 ENCOUNTER — Other Ambulatory Visit: Payer: Self-pay

## 2018-01-17 ENCOUNTER — Telehealth: Payer: Self-pay

## 2018-01-17 ENCOUNTER — Inpatient Hospital Stay: Payer: Medicare Other

## 2018-01-17 ENCOUNTER — Inpatient Hospital Stay: Payer: Medicare Other | Attending: Hematology and Oncology | Admitting: Adult Health

## 2018-01-17 ENCOUNTER — Encounter (HOSPITAL_COMMUNITY): Payer: Self-pay | Admitting: *Deleted

## 2018-01-17 ENCOUNTER — Inpatient Hospital Stay (HOSPITAL_COMMUNITY)
Admission: AD | Admit: 2018-01-17 | Discharge: 2018-01-20 | DRG: 640 | Disposition: A | Discharge status: Home / Self Care | Payer: Medicare Other | Source: Ambulatory Visit | Attending: Nephrology | Admitting: Nephrology

## 2018-01-17 DIAGNOSIS — Z86718 Personal history of other venous thrombosis and embolism: Secondary | ICD-10-CM

## 2018-01-17 DIAGNOSIS — Z9221 Personal history of antineoplastic chemotherapy: Secondary | ICD-10-CM | POA: Diagnosis not present

## 2018-01-17 DIAGNOSIS — Z9049 Acquired absence of other specified parts of digestive tract: Secondary | ICD-10-CM | POA: Diagnosis not present

## 2018-01-17 DIAGNOSIS — I8290 Acute embolism and thrombosis of unspecified vein: Secondary | ICD-10-CM

## 2018-01-17 DIAGNOSIS — W19XXXA Unspecified fall, initial encounter: Secondary | ICD-10-CM | POA: Diagnosis not present

## 2018-01-17 DIAGNOSIS — C50512 Malignant neoplasm of lower-outer quadrant of left female breast: Secondary | ICD-10-CM | POA: Diagnosis not present

## 2018-01-17 DIAGNOSIS — Z853 Personal history of malignant neoplasm of breast: Secondary | ICD-10-CM

## 2018-01-17 DIAGNOSIS — N179 Acute kidney failure, unspecified: Secondary | ICD-10-CM | POA: Diagnosis present

## 2018-01-17 DIAGNOSIS — Z801 Family history of malignant neoplasm of trachea, bronchus and lung: Secondary | ICD-10-CM

## 2018-01-17 DIAGNOSIS — E860 Dehydration: Secondary | ICD-10-CM | POA: Diagnosis not present

## 2018-01-17 DIAGNOSIS — Z90710 Acquired absence of both cervix and uterus: Secondary | ICD-10-CM

## 2018-01-17 DIAGNOSIS — M1990 Unspecified osteoarthritis, unspecified site: Secondary | ICD-10-CM | POA: Diagnosis present

## 2018-01-17 DIAGNOSIS — Z7982 Long term (current) use of aspirin: Secondary | ICD-10-CM

## 2018-01-17 DIAGNOSIS — G9341 Metabolic encephalopathy: Secondary | ICD-10-CM | POA: Diagnosis present

## 2018-01-17 DIAGNOSIS — Z923 Personal history of irradiation: Secondary | ICD-10-CM

## 2018-01-17 DIAGNOSIS — R410 Disorientation, unspecified: Secondary | ICD-10-CM | POA: Diagnosis not present

## 2018-01-17 DIAGNOSIS — J449 Chronic obstructive pulmonary disease, unspecified: Secondary | ICD-10-CM | POA: Diagnosis present

## 2018-01-17 DIAGNOSIS — I129 Hypertensive chronic kidney disease with stage 1 through stage 4 chronic kidney disease, or unspecified chronic kidney disease: Secondary | ICD-10-CM | POA: Diagnosis present

## 2018-01-17 DIAGNOSIS — Z9181 History of falling: Secondary | ICD-10-CM

## 2018-01-17 DIAGNOSIS — E876 Hypokalemia: Secondary | ICD-10-CM | POA: Diagnosis not present

## 2018-01-17 DIAGNOSIS — G4733 Obstructive sleep apnea (adult) (pediatric): Secondary | ICD-10-CM | POA: Diagnosis not present

## 2018-01-17 DIAGNOSIS — I951 Orthostatic hypotension: Secondary | ICD-10-CM | POA: Diagnosis present

## 2018-01-17 DIAGNOSIS — C787 Secondary malignant neoplasm of liver and intrahepatic bile duct: Secondary | ICD-10-CM

## 2018-01-17 DIAGNOSIS — Z9012 Acquired absence of left breast and nipple: Secondary | ICD-10-CM

## 2018-01-17 DIAGNOSIS — Z8249 Family history of ischemic heart disease and other diseases of the circulatory system: Secondary | ICD-10-CM

## 2018-01-17 DIAGNOSIS — Z170 Estrogen receptor positive status [ER+]: Secondary | ICD-10-CM

## 2018-01-17 DIAGNOSIS — C7800 Secondary malignant neoplasm of unspecified lung: Secondary | ICD-10-CM | POA: Diagnosis present

## 2018-01-17 DIAGNOSIS — E1122 Type 2 diabetes mellitus with diabetic chronic kidney disease: Secondary | ICD-10-CM | POA: Diagnosis present

## 2018-01-17 DIAGNOSIS — R531 Weakness: Secondary | ICD-10-CM | POA: Diagnosis not present

## 2018-01-17 DIAGNOSIS — N182 Chronic kidney disease, stage 2 (mild): Secondary | ICD-10-CM | POA: Diagnosis present

## 2018-01-17 DIAGNOSIS — I10 Essential (primary) hypertension: Secondary | ICD-10-CM | POA: Diagnosis not present

## 2018-01-17 DIAGNOSIS — Z5111 Encounter for antineoplastic chemotherapy: Secondary | ICD-10-CM | POA: Insufficient documentation

## 2018-01-17 DIAGNOSIS — Z803 Family history of malignant neoplasm of breast: Secondary | ICD-10-CM | POA: Diagnosis not present

## 2018-01-17 DIAGNOSIS — C786 Secondary malignant neoplasm of retroperitoneum and peritoneum: Secondary | ICD-10-CM | POA: Diagnosis present

## 2018-01-17 DIAGNOSIS — C778 Secondary and unspecified malignant neoplasm of lymph nodes of multiple regions: Secondary | ICD-10-CM | POA: Insufficient documentation

## 2018-01-17 DIAGNOSIS — Z171 Estrogen receptor negative status [ER-]: Secondary | ICD-10-CM | POA: Insufficient documentation

## 2018-01-17 DIAGNOSIS — Z7901 Long term (current) use of anticoagulants: Secondary | ICD-10-CM

## 2018-01-17 DIAGNOSIS — F411 Generalized anxiety disorder: Secondary | ICD-10-CM | POA: Diagnosis present

## 2018-01-17 DIAGNOSIS — Z87891 Personal history of nicotine dependence: Secondary | ICD-10-CM | POA: Diagnosis not present

## 2018-01-17 DIAGNOSIS — R627 Adult failure to thrive: Secondary | ICD-10-CM | POA: Diagnosis not present

## 2018-01-17 DIAGNOSIS — C799 Secondary malignant neoplasm of unspecified site: Secondary | ICD-10-CM | POA: Diagnosis not present

## 2018-01-17 LAB — CMP (CANCER CENTER ONLY)
ALT: 32 U/L (ref 0–55)
ANION GAP: 8 (ref 3–11)
AST: 63 U/L — ABNORMAL HIGH (ref 5–34)
Albumin: 3.2 g/dL — ABNORMAL LOW (ref 3.5–5.0)
Alkaline Phosphatase: 85 U/L (ref 40–150)
BUN: 21 mg/dL (ref 7–26)
CALCIUM: 13.9 mg/dL — AB (ref 8.4–10.4)
CHLORIDE: 101 mmol/L (ref 98–109)
CO2: 25 mmol/L (ref 22–29)
Creatinine: 1.21 mg/dL — ABNORMAL HIGH (ref 0.60–1.10)
GFR, EST AFRICAN AMERICAN: 51 mL/min — AB (ref >60)
GFR, EST NON AFRICAN AMERICAN: 44 mL/min — AB (ref >60)
Glucose, Bld: 101 mg/dL (ref 70–140)
Potassium: 4.7 mmol/L (ref 3.5–5.1)
Sodium: 134 mmol/L — ABNORMAL LOW (ref 136–145)
Total Bilirubin: 0.5 mg/dL (ref 0.2–1.2)
Total Protein: 6.3 g/dL — ABNORMAL LOW (ref 6.4–8.3)

## 2018-01-17 LAB — CBC WITH DIFFERENTIAL (CANCER CENTER ONLY)
BASOS PCT: 1 %
Basophils Absolute: 0 10*3/uL (ref 0.0–0.1)
Eosinophils Absolute: 0 10*3/uL (ref 0.0–0.5)
Eosinophils Relative: 0 %
HEMATOCRIT: 36.3 % (ref 34.8–46.6)
HEMOGLOBIN: 11.9 g/dL (ref 11.6–15.9)
Lymphocytes Relative: 28 %
Lymphs Abs: 0.9 10*3/uL (ref 0.9–3.3)
MCH: 29.9 pg (ref 25.1–34.0)
MCHC: 32.7 g/dL (ref 31.5–36.0)
MCV: 91.4 fL (ref 79.5–101.0)
MONOS PCT: 21 %
Monocytes Absolute: 0.7 10*3/uL (ref 0.1–0.9)
NEUTROS ABS: 1.7 10*3/uL (ref 1.5–6.5)
NEUTROS PCT: 50 %
Platelet Count: 284 10*3/uL (ref 145–400)
RBC: 3.97 MIL/uL (ref 3.70–5.45)
RDW: 15 % — ABNORMAL HIGH (ref 11.2–14.5)
WBC: 3.3 10*3/uL — AB (ref 3.9–10.3)

## 2018-01-17 LAB — MAGNESIUM: MAGNESIUM: 2 mg/dL (ref 1.5–2.5)

## 2018-01-17 MED ORDER — GLUCERNA SHAKE PO LIQD
237.0000 mL | Freq: Three times a day (TID) | ORAL | Status: DC
  Administered 2018-01-18 – 2018-01-19 (×3): 237 mL via ORAL
  Filled 2018-01-17 (×9): qty 237

## 2018-01-17 MED ORDER — LORAZEPAM 0.5 MG PO TABS: 0.5000 mg | ORAL_TABLET | Freq: Every evening | ORAL | Status: DC | PRN

## 2018-01-17 MED ORDER — CALCITONIN (SALMON) 200 UNIT/ML IJ SOLN
4.0000 [IU]/kg | Freq: Two times a day (BID) | INTRAMUSCULAR | Status: AC
  Administered 2018-01-17 – 2018-01-19 (×4): 290 [IU] via SUBCUTANEOUS
  Filled 2018-01-17 (×4): qty 1.45

## 2018-01-17 MED ORDER — UMECLIDINIUM BROMIDE 62.5 MCG/INH IN AEPB
1.0000 | INHALATION_SPRAY | Freq: Every day | RESPIRATORY_TRACT | Status: DC
  Administered 2018-01-18 – 2018-01-20 (×3): 1 via RESPIRATORY_TRACT
  Filled 2018-01-17: qty 7

## 2018-01-17 MED ORDER — ANASTROZOLE 1 MG PO TABS
1.0000 mg | ORAL_TABLET | Freq: Every day | ORAL | Status: DC
  Administered 2018-01-18 – 2018-01-20 (×3): 1 mg via ORAL
  Filled 2018-01-17 (×3): qty 1

## 2018-01-17 MED ORDER — PROMETHAZINE HCL 25 MG/ML IJ SOLN: 12.5000 mg | Freq: Four times a day (QID) | INTRAMUSCULAR | Status: DC | PRN

## 2018-01-17 MED ORDER — RIVAROXABAN 20 MG PO TABS
20.0000 mg | ORAL_TABLET | Freq: Every day | ORAL | Status: DC
  Administered 2018-01-17 – 2018-01-19 (×3): 20 mg via ORAL
  Filled 2018-01-17 (×3): qty 1

## 2018-01-17 MED ORDER — SODIUM CHLORIDE 0.9 % IV SOLN
INTRAVENOUS | Status: DC
  Administered 2018-01-17 – 2018-01-18 (×3): via INTRAVENOUS
  Administered 2018-01-19: 125 mL/h via INTRAVENOUS
  Administered 2018-01-20: 12:00:00 via INTRAVENOUS

## 2018-01-17 MED ORDER — ONDANSETRON HCL 4 MG PO TABS: 4.0000 mg | ORAL_TABLET | Freq: Four times a day (QID) | ORAL | Status: DC | PRN

## 2018-01-17 MED ORDER — SODIUM CHLORIDE 0.9 % IV BOLUS
1000.0000 mL | Freq: Once | INTRAVENOUS | Status: AC
  Administered 2018-01-17: 1000 mL via INTRAVENOUS

## 2018-01-17 MED ORDER — POTASSIUM CHLORIDE CRYS ER 20 MEQ PO TBCR
20.0000 meq | EXTENDED_RELEASE_TABLET | Freq: Every day | ORAL | Status: DC
  Administered 2018-01-18 – 2018-01-20 (×3): 20 meq via ORAL
  Filled 2018-01-17 (×4): qty 1

## 2018-01-17 MED ORDER — ONDANSETRON HCL 4 MG/2ML IJ SOLN
4.0000 mg | Freq: Four times a day (QID) | INTRAMUSCULAR | Status: DC | PRN
  Administered 2018-01-18: 4 mg via INTRAVENOUS
  Filled 2018-01-17: qty 2

## 2018-01-17 MED ORDER — CITALOPRAM HYDROBROMIDE 20 MG PO TABS
20.0000 mg | ORAL_TABLET | Freq: Every day | ORAL | Status: DC
  Administered 2018-01-18 – 2018-01-20 (×3): 20 mg via ORAL
  Filled 2018-01-17 (×3): qty 1

## 2018-01-17 MED ORDER — MELATONIN 3 MG PO TABS
3.0000 mg | ORAL_TABLET | Freq: Every evening | ORAL | Status: DC | PRN
  Administered 2018-01-17 – 2018-01-18 (×2): 3 mg via ORAL
  Filled 2018-01-17 (×2): qty 1

## 2018-01-17 MED ORDER — ASPIRIN EC 81 MG PO TBEC
81.0000 mg | DELAYED_RELEASE_TABLET | Freq: Every day | ORAL | Status: DC
  Administered 2018-01-18 – 2018-01-20 (×3): 81 mg via ORAL
  Filled 2018-01-17 (×3): qty 1

## 2018-01-17 MED ORDER — ZOLEDRONIC ACID 4 MG/5ML IV CONC
4.0000 mg | Freq: Once | INTRAVENOUS | Status: AC
  Administered 2018-01-17: 4 mg via INTRAVENOUS
  Filled 2018-01-17: qty 5

## 2018-01-17 MED ORDER — ACETAMINOPHEN 325 MG PO TABS: 650.0000 mg | ORAL_TABLET | Freq: Four times a day (QID) | ORAL | Status: DC | PRN

## 2018-01-17 MED ORDER — SODIUM CHLORIDE 0.9 % IV SOLN
Freq: Once | INTRAVENOUS | Status: AC
  Administered 2018-01-17: 16:00:00 via INTRAVENOUS

## 2018-01-17 MED ORDER — ACETAMINOPHEN 650 MG RE SUPP: 650.0000 mg | Freq: Four times a day (QID) | RECTAL | Status: DC | PRN

## 2018-01-17 NOTE — Progress Notes (Addendum)
ANTICOAGULATION CONSULT NOTE - Initial Consult  Pharmacy Consult for Xarelto Indication: VTE treatment (hx of portal vein thrombosis)  No Known Allergies  Patient Measurements: Height: 4\' 11"  (149.9 cm) Weight: 160 lb 0.9 oz (72.6 kg) IBW/kg (Calculated) : 43.2  Vital Signs: Temp: 97.9 F (36.6 C) (04/05 1642) Temp Source: Oral (04/05 1642) BP: 129/81 (04/05 1642) Pulse Rate: 81 (04/05 1642)  Labs: Recent Labs    01/17/18 1449  HCT 36.3  PLT 284  CREATININE 1.21*    Estimated Creatinine Clearance: 36.5 mL/min (A) (by C-G formula based on SCr of 1.21 mg/dL (H)).   Medical History: Past Medical History:  Diagnosis Date  . Anxiety    h/o anxiety attack  . Arthritis    back meds not required  . Breast cancer ([place])    left breast  . Breast cancer, left breast ([place]) 05/09/2011  . COPD (chronic obstructive pulmonary disease) ([place])   . Diabetes mellitus    borderline  . Hypertension   . Pneumonia   . Sleep apnea    Uses CPAP    Medications:  Medications Prior to Admission  Medication Sig Dispense Refill Last Dose  . amLODipine (NORVASC) 5 MG tablet Take 5 mg by mouth daily.     01/17/2018 at Unknown time  . anastrozole (ARIMIDEX) 1 MG tablet Take 1 tablet (1 mg total) by mouth daily. 90 tablet 3 01/17/2018 at 0800  . aspirin 81 MG tablet Take 81 mg by mouth daily.     Past Week at Unknown time  . citalopram (CELEXA) 20 MG tablet Take 20 mg by mouth daily.     Past Week at Unknown time  . INCRUSE ELLIPTA 62.5 MCG/INH AEPB Inhale 1 puff into the lungs daily.   Past Week at Unknown time  . KLOR-CON M20 20 MEQ tablet Take 1 tablet by mouth daily.   Past Week at Unknown time  . lidocaine-prilocaine (EMLA) cream Apply to affected area once 30 g 3 Past Week at Unknown time  . losartan (COZAAR) 25 MG tablet Take 25 mg by mouth daily.    01/17/2018 at Unknown time  . Melatonin 3 MG CAPS Take 3 mg by mouth at bedtime.    Past Week at Unknown time  . metFORMIN (GLUCOPHAGE) 500 MG  tablet Take 500 mg by mouth 2 (two) times daily with a meal.   01/17/2018 at Unknown time  . ondansetron (ZOFRAN) 8 MG tablet Take 1 tablet (8 mg total) by mouth 2 (two) times daily as needed (Nausea or vomiting). 30 tablet 1 Past Month at Unknown time  . oxybutynin (DITROPAN) 5 MG tablet Take 5 mg by mouth every 8 (eight) hours as needed for bladder spasms.    01/17/2018 at Unknown time  . prochlorperazine (COMPAZINE) 10 MG tablet Take 1 tablet (10 mg total) by mouth every 6 (six) hours as needed (Nausea or vomiting). 30 tablet 1 Past Month at Unknown time  . VITAMIN D, CHOLECALCIFEROL, PO Take 1 tablet by mouth daily.    01/17/2018 at Unknown time  . alendronate (FOSAMAX) 70 MG tablet Take 70 mg by mouth once a week.       Scheduled:  . [START ON 01/18/2018] anastrozole  1 mg Oral Daily  . [START ON 01/18/2018] aspirin EC  81 mg Oral Daily  . calcitonin  4 Units/kg Subcutaneous BID  . [START ON 01/18/2018] citalopram  20 mg Oral Daily  . [START ON 01/18/2018] feeding supplement (GLUCERNA SHAKE)  237 mL Oral  TID BM  . potassium chloride SA  20 mEq Oral Daily  . umeclidinium bromide  1 puff Inhalation Daily   Infusions:  . sodium chloride 125 mL/hr at 01/17/18 1724  . zoledronic acid (ZOMETA) IV      Assessment: 48 yoF admitted 4/5 with hypercalcemia, AMS, weakness, falls, dehydration.  PMH significant for breast cancer (most recent chemo, cycle 2 Eribulin given on 3/20, 3/28) and portal vein thrombosis previously on rivaroxaban, but stopped in the past month, pt cannot remember exactly when.  Pt reports she "finished the course, the doctor told me I was done with it."  Pharmacy is consulted to continue rivaroxaban for VTE treatment.  Today, 01/17/2018: CBC: Hgb 11.9, Plt 284 SCr 1.21, CrCl ~ 36 ml/min   Goal of Therapy:  Monitor platelets by anticoagulation protocol: Yes   Plan:   Xarelto 20mg  PO daily with supper.  Continue to monitor SCr, CBC.  Gretta Arab PharmD, BCPS Pager  [phone] 01/17/2018 6:00 PM

## 2018-01-17 NOTE — Assessment & Plan Note (Addendum)
Left breast invasive ductal carcinoma T3 N1 M0 status post mastectomy, Oncotype DX score 28 status post 4 cycles of Taxotere and Cytoxan followed by chest wall radiation and Arimidex 1 mg daily since March 2013  New lesions on CT A/P show liver, lower lung, and retroperitoneal lesions Liver biopsy: Poorly differentiated carcinoma ER PR negative  PET/CT scan: Metastatic disease involving bilateral lungs, hilar and mediastinal nodes, liver metastases extremely large size, retroperitoneal nodes   Cycle 2 day 20 Eribulin  Christina Horn is weak, hypotensive, and clearly dehydrated from the chemotherapy.  She continues to have hypercalcemia despite receiving Zometa on 2/6, 3/7, 3/20, and 3/28.  She is not safe to go home due to her dehydration and recent falls.  Dr. Jana Hakim met with Christina Horn, her son, and her son's uncle today.  Her recommended that we get [facility] admitted to the hospital for IV fluids, and management of her hypercalcemia.    I spoke with Dr. Roxan Hockey who has graciously accepted to care for [name] in the hospital in a tele bed.

## 2018-01-17 NOTE — Progress Notes (Addendum)
[facility] Follow up:    Christina Horn, [place] [address]   DIAGNOSIS: Cancer Staging No matching staging information was found for the patient.  SUMMARY OF ONCOLOGIC HISTORY:   Breast cancer of lower-outer quadrant of left female breast ([place])   06/07/2011 Surgery     left mastectomy : multiple foci of invasive ductal carcinoma grade 2 5.1 cm and 1 cm with high-grade DCIS, lymphovascular invasion , 1/7 [place] lymph node ER 100%, PR and her percent, Ki-67 50%, HER-2 equivocal ratio 2 , Oncotype 28      06/20/2011 - 09/11/2011 Chemotherapy    TC X 4      10/08/2011 - 11/13/2011 Radiation Therapy    chest wall radiation therapy       12/25/2011 -  Anti-estrogen oral therapy    Arimidex 1 mg daily       11/12/2017 Relapse/Recurrence    CT scan demonstrates lesion in liver, lung, and retroperitoneal lymph node.        12/11/2017 -  Chemotherapy    Eribulin given on day 1 and day 8 of a 21 day cycle.        CURRENT THERAPY: Eribulin given on day 1 and 8  INTERVAL HISTORY: Christina Horn 72 y.o. female returns for evaluation of progressive weakness over the past 4-5 days.  Christina Horn received chemotherapy last on 01/09/18.  Since then she has been progressively weaker.  She has fallen twice and her son has had a hard time helping her up.  She can no longer stand without assistance.  She has been able to take in very little PO.  Her son has said that she is confused at times.  Her weight has dropped 11 pounds in the past month.    Patient Active Problem List   Diagnosis Date Noted  . Hypercalcemia 11/20/2017  . Liver metastasis ([place]) 11/14/2017  . Lung metastases ([place]) 11/14/2017  . Depression with anxiety 08/18/2016  . Dyspnea 08/18/2016  . Non-insulin dependent type 2 diabetes mellitus ([place]) 08/18/2016  . Hypertension 08/18/2016  . OSA on CPAP 08/18/2016  . Kidney disease 08/18/2016  . SOB (shortness of breath) 08/18/2016   . COPD (chronic obstructive pulmonary disease) ([place]) 08/18/2016  . Elevated troponin 08/18/2016  . Osteopenia 10/27/2015  . Breast cancer of lower-outer quadrant of left female breast ([place]) 05/09/2011    has No Known Allergies.  MEDICAL HISTORY: Past Medical History:  Diagnosis Date  . Anxiety    h/o anxiety attack  . Arthritis    back meds not required  . Breast cancer ([place])    left breast  . Breast cancer, left breast ([place]) 05/09/2011  . COPD (chronic obstructive pulmonary disease) ([place])   . Diabetes mellitus    borderline  . Hypertension   . Pneumonia   . Sleep apnea    Uses CPAP    SURGICAL HISTORY: Past Surgical History:  Procedure Laterality Date  . ABDOMINAL HYSTERECTOMY    . BREAST SURGERY    . CHOLECYSTECTOMY    . IR FLUORO GUIDE PORT INSERTION RIGHT  12/10/2017  . IR US GUIDE VASC ACCESS RIGHT  12/10/2017  . left leg vein striping    . MASTECTOMY  06/07/11   left   . TRACHEOSTOMY    . VASCULAR SURGERY      SOCIAL HISTORY: Social History   Socioeconomic History  . Marital status: Widowed    Spouse name: Not on file  .  Number of children: Not on file  . Years of education: Not on file  . Highest education level: Not on file  Occupational History  . Not on file  Social Needs  . Financial resource strain: Not on file  . Food insecurity:    Worry: Not on file    Inability: Not on file  . Transportation needs:    Medical: Not on file    Non-medical: Not on file  Tobacco Use  . Smoking status: Former Smoker    Packs/day: 1.50    Years: 25.00    Pack years: 37.50    Types: Cigarettes    Last attempt to quit: 10/19/2001    Years since quitting: 16.2  . Smokeless tobacco: Never Used  Substance and Sexual Activity  . Alcohol use: No  . Drug use: No  . Sexual activity: Not Currently  Lifestyle  . Physical activity:    Days per week: Not on file    Minutes per session: Not on file  . Stress: Not on file  Relationships  . Social connections:     Talks on phone: Not on file    Gets together: Not on file    Attends religious service: Not on file    Active member of club or organization: Not on file    Attends meetings of clubs or organizations: Not on file    Relationship status: Not on file  . Intimate partner violence:    Fear of current or ex partner: Not on file    Emotionally abused: Not on file    Physically abused: Not on file    Forced sexual activity: Not on file  Other Topics Concern  . Not on file  Social History Narrative  . Not on file    FAMILY HISTORY: Family History  Problem Relation Age of Onset  . Cancer Mother        breast  . Cancer Sister        lung  . Cancer Maternal Aunt        lung  . Cancer Maternal Grandmother        unknown  . Hypertension Sister     Review of Systems  Constitutional: Positive for appetite change, fatigue and unexpected weight change. Negative for chills, diaphoresis and fever.  HENT:   Negative for hearing loss, lump/mass, mouth sores and trouble swallowing.   Eyes: Negative for eye problems and icterus.  Respiratory: Negative for chest tightness, cough and shortness of breath.   Cardiovascular: Negative for chest pain, leg swelling and palpitations.  Gastrointestinal: Negative for abdominal distention, abdominal pain, constipation, diarrhea, nausea and vomiting.  Endocrine: Negative for hot flashes.  Skin: Negative for itching.  Neurological: Negative for extremity weakness and headaches.  Psychiatric/Behavioral: Negative for depression. The patient is not nervous/anxious.       PHYSICAL EXAMINATION  ECOG PERFORMANCE STATUS: 3 - Symptomatic, >50% confined to bed  Vitals:   01/17/18 1500  BP: (!) 81/63  Pulse: 85  Resp: 18  Temp: 98.3 F (36.8 C)  SpO2: 100%    Physical Exam  Constitutional: She is oriented to person, place, and time.  Appears unwell, non toxic  HENT:  Head: Normocephalic and atraumatic.  Mouth/Throat: Oropharynx is clear and moist.  No oropharyngeal exudate.  Eyes: Pupils are equal, round, and reactive to light. No scleral icterus.  Neck: Neck supple.  Cardiovascular: Normal rate, regular rhythm and normal heart sounds.  Pulmonary/Chest: Effort normal and breath sounds normal.  Abdominal: Soft. She exhibits distension.  Hypoactive bowel sounds present  Musculoskeletal: She exhibits no edema.  Lymphadenopathy:    She has no cervical adenopathy.  Neurological: She is alert and oriented to person, place, and time.  Skin: Skin is warm and dry.  Psychiatric: Mood and affect normal.    LABORATORY DATA:  CBC    Component Value Date/Time   WBC 3.3 (L) 01/17/2018 1449   WBC 8.0 12/10/2017 1247   RBC 3.97 01/17/2018 1449   HGB 12.3 12/10/2017 1247   HGB 12.2 10/25/2016 1211   HCT 36.3 01/17/2018 1449   HCT 37.2 10/25/2016 1211   PLT 284 01/17/2018 1449   PLT 274 10/25/2016 1211   MCV 91.4 01/17/2018 1449   MCV 94.7 10/25/2016 1211   MCH 29.9 01/17/2018 1449   MCHC 32.7 01/17/2018 1449   RDW 15.0 (H) 01/17/2018 1449   RDW 13.3 10/25/2016 1211   LYMPHSABS 0.9 01/17/2018 1449   LYMPHSABS 1.7 10/25/2016 1211   MONOABS 0.7 01/17/2018 1449   MONOABS 0.6 10/25/2016 1211   EOSABS 0.0 01/17/2018 1449   EOSABS 0.2 10/25/2016 1211   BASOSABS 0.0 01/17/2018 1449   BASOSABS 0.1 10/25/2016 1211    CMP     Component Value Date/Time   NA 134 (L) 01/17/2018 1449   NA 143 10/25/2016 1211   K 4.7 01/17/2018 1449   K 4.7 10/25/2016 1211   CL 101 01/17/2018 1449   CL 100 07/30/2012 0939   CO2 25 01/17/2018 1449   CO2 28 10/25/2016 1211   GLUCOSE 101 01/17/2018 1449   GLUCOSE 108 10/25/2016 1211   GLUCOSE 167 (H) 07/30/2012 0939   BUN 21 01/17/2018 1449   BUN 13.4 10/25/2016 1211   CREATININE 1.21 (H) 01/17/2018 1449   CREATININE 0.8 10/25/2016 1211   CALCIUM 13.9 (HH) 01/17/2018 1449   CALCIUM 9.4 10/25/2016 1211   PROT 6.3 (L) 01/17/2018 1449   PROT 6.4 10/25/2016 1211   ALBUMIN 3.2 (L) 01/17/2018 1449    ALBUMIN 3.4 (L) 10/25/2016 1211   AST 63 (H) 01/17/2018 1449   AST 18 10/25/2016 1211   ALT 32 01/17/2018 1449   ALT 21 10/25/2016 1211   ALKPHOS 85 01/17/2018 1449   ALKPHOS 55 10/25/2016 1211   BILITOT 0.5 01/17/2018 1449   BILITOT 0.41 10/25/2016 1211   GFRNONAA 44 (L) 01/17/2018 1449   GFRAA 51 (L) 01/17/2018 1449     ASSESSMENT and PLAN:   Breast cancer of lower-outer quadrant of left female breast ([place]) Left breast invasive ductal carcinoma T3 N1 M0 status post mastectomy, Oncotype DX score 28 status post 4 cycles of Taxotere and Cytoxan followed by chest wall radiation and Arimidex 1 mg daily since March 2013  New lesions on CT A/P show liver, lower lung, and retroperitoneal lesions Liver biopsy: Poorly differentiated carcinoma ER PR negative  PET/CT scan: Metastatic disease involving bilateral lungs, hilar and mediastinal nodes, liver metastases extremely large size, retroperitoneal nodes   Cycle 2 day 20 Eribulin  Raley is weak, hypotensive, and clearly dehydrated from the chemotherapy.  She continues to have hypercalcemia despite receiving Zometa on 2/6, 3/7, 3/20, and 3/28.  She is not safe to go home due to her dehydration and recent falls.  Dr. Jana Hakim met with Hoyle Sauer, her son, and her son's uncle today.  Her recommended that we get [facility] admitted to the hospital for IV fluids, and management of her hypercalcemia.    I spoke with Dr. Roxan Hockey who has graciously accepted  to care for [facility] in the hospital in a tele bed.        Orders Placed This Encounter  Procedures  . CBC with Differential (Cancer Center Only)    Standing Status:   Future    Number of Occurrences:   1    Standing Expiration Date:   01/18/2019  . CMP ([facility] only)    Standing Status:   Future    Number of Occurrences:   1    Standing Expiration Date:   01/18/2019  . Magnesium    Standing Status:   Future    Number of Occurrences:   1    Standing Expiration Date:   01/17/2019     All questions were answered. The patient knows to call the clinic with any problems, questions or concerns. We can certainly see the patient much sooner if necessary. This note was electronically signed. Scot Dock, NP 01/17/2018    ADDENDUM: This elderly White woman with metastatic breast cancer is at nadir from her second cycle of chemotherapy; she is very weak (found on floor by family), hypotensive, and hypercalcemic; she will benefit from admission for hydration and further therapy of the hypercalcemia (note she received Zometa 01/09/2018)  I wil alert Dr Lindi Adie re admission-- he will return Monday.  Perhaps changing her chemo to Q2W would be feasible  I personally saw this patient and performed a substantive portion of this encounter with the listed APP documented above.   Chauncey Cruel, MD Medical Oncology and Hematology [facility] [address] [place], [place] [address] Tel. [phone]    Fax. [phone]

## 2018-01-17 NOTE — H&P (Signed)
History and Physical    VI BIDDINGER GBT:[id number] DOB: 07-Dec-1945 DOA: 01/17/2018  Referring MD/NP/PA: Wilber Bihari, direct admission from [place] PCP: [name], MD   Patient coming from: home ([place] direct admit)  Chief Complaint: fatigue, fuzzy thinking, dehydration, and hypercalcemia  HPI: Christina Horn is a 72 y.o. female with history of breast cancer of the left breast initially treated with left mastectomy, chemo and radiation in 2012 with a relapse discovered on CT in January 2019, COPD, borderline diabetes mellitus, hypertension, obstructive sleep apnea, anxiety who presents with fatigue, fuzzy thinking and dehydration.  Patient is followed by Dr. Jana Hakim and has been receiving Eribulin chemotherapy since February, last chemo on 01/09/2018.  Her course has been complicated by a portal vein thrombus for which she has on anticoagulation and persistent hypercalcemia for which she has been receiving frequent Zometa, last dose on 3/28.  Over the weekend she developed increasing nausea and was unable to tolerate much food or drink.  She has become increasingly weak and she has twice passed out when standing up once on Monday, and again on Tuesday suggestive of orthostasis.  She only eats a few bites and takes a few sips of fluids over the course of the day.  She was seen in the oncology office today and was found to be weak, hypotensive and obviously dehydrated.  She was started on IV fluids and given 750 mL of saline and then directly admitted to [facility] long hospital because her calcium remains elevated.    Review of Systems:  She denies fevers or chills.  She has been coughing a little bit with some phlegm and has very dry lips and a sore belly which has been going on since this weekend.  She denies constipation or diarrhea.  Complete 12 point review of systems reviewed with patient and negative except as mentioned above.    Past Medical History:  Diagnosis Date  .  Anxiety    h/o anxiety attack  . Arthritis    back meds not required  . Breast cancer ([place])    left breast  . Breast cancer, left breast ([place]) 05/09/2011  . COPD (chronic obstructive pulmonary disease) ([place])   . Diabetes mellitus    borderline  . Hypertension   . Pneumonia   . Sleep apnea    Uses CPAP    Past Surgical History:  Procedure Laterality Date  . ABDOMINAL HYSTERECTOMY    . BREAST SURGERY    . CHOLECYSTECTOMY    . IR FLUORO GUIDE PORT INSERTION RIGHT  12/10/2017  . IR US GUIDE VASC ACCESS RIGHT  12/10/2017  . left leg vein striping    . MASTECTOMY  06/07/11   left   . TRACHEOSTOMY    . VASCULAR SURGERY       reports that she quit smoking about 16 years ago. Her smoking use included cigarettes. She has a 37.50 pack-year smoking history. She has never used smokeless tobacco. She reports that she does not drink alcohol or use drugs.  No Known Allergies  Family History  Problem Relation Age of Onset  . Cancer Mother        breast  . Cancer Sister        lung  . Cancer Maternal Aunt        lung  . Cancer Maternal Grandmother        unknown  . Hypertension Sister     Prior to Admission medications   Medication Sig Start  Date End Date Taking? Authorizing Provider  amLODipine (NORVASC) 5 MG tablet Take 5 mg by mouth daily.      [provider]  anastrozole (ARIMIDEX) 1 MG tablet Take 1 tablet (1 mg total) by mouth daily. 10/25/17   Nicholas Lose, MD  aspirin 81 MG tablet Take 81 mg by mouth daily.      [provider]  citalopram (CELEXA) 20 MG tablet Take 20 mg by mouth daily.      [provider]  fish oil-omega-3 fatty acids 1000 MG capsule Take 2 g by mouth daily.      [provider]  INCRUSE ELLIPTA 62.5 MCG/INH AEPB Inhale 1 puff into the lungs daily. 08/08/16   [provider]  KLOR-CON M20 20 MEQ tablet Take 1 tablet by mouth daily. 05/30/16   [provider]  lidocaine-prilocaine (EMLA) cream Apply to  affected area once Patient not taking: Reported on 12/11/2017 12/09/17   Nicholas Lose, MD  LORazepam (ATIVAN) 0.5 MG tablet Take 1 tablet (0.5 mg total) by mouth at bedtime as needed (Nausea or vomiting). Patient not taking: Reported on 12/11/2017 12/09/17   Nicholas Lose, MD  losartan (COZAAR) 25 MG tablet  02/05/13   [provider]  Melatonin 3 MG CAPS Take by mouth.    [provider]  metFORMIN (GLUCOPHAGE) 500 MG tablet Take 500 mg by mouth 2 (two) times daily with a meal.    [provider]  ondansetron (ZOFRAN) 8 MG tablet Take 1 tablet (8 mg total) by mouth 2 (two) times daily as needed (Nausea or vomiting). Patient not taking: Reported on 12/11/2017 12/09/17   Nicholas Lose, MD  oxybutynin (DITROPAN) 5 MG tablet daily. 12/05/13   [provider]  prochlorperazine (COMPAZINE) 10 MG tablet Take 1 tablet (10 mg total) by mouth every 6 (six) hours as needed (Nausea or vomiting). Patient not taking: Reported on 12/11/2017 12/09/17   Nicholas Lose, MD  VITAMIN D, CHOLECALCIFEROL, PO Take by mouth.      [provider]  Dollene Primrose PACK 15 & 20 MG TBPK Take 15 mg by mouth 2 (two) times daily. 11/13/17   [provider]    Physical Exam: Vitals:   01/17/18 1642  BP: 129/81  Pulse: 81  Resp: 19  Temp: 97.9 F (36.6 C)  TempSrc: Oral  SpO2: 99%  Weight: 72.6 kg (160 lb 0.9 oz)  Height: 4\' 11"  (1.499 m)    Constitutional: Adult female, NAD, calm, comfortable Eyes: PERRL, lids and conjunctivae normal ENMT: Dry mucous membranes with a smooth tongue.  Oropharynx nonerythematous, no exudates.   Neck:  No nuchal rigidity, no masses Respiratory:  No wheezes, rales, or rhonchi Cardiovascular: Regular rate and rhythm, no murmurs / rubs / gallops.  2+ radial pulses. Abdomen:  Normal active bowel sounds, soft, nondistended, mildly tender to palpation in the epigastric area, right upper quadrant of her liver which is enlarged Musculoskeletal:  Normal muscle tone and bulk.  No contractures.  Skin:  no rashes, abrasions, or ulcers Neurologic:  CN 2-12 grossly intact. Sensation intact to light touch, strength 5/5 throughout Psychiatric:  Alert and oriented x 3. Normal affect.   Labs on Admission: I have personally reviewed following labs and imaging studies  CBC: Recent Labs  Lab 01/17/18 1449  WBC 3.3*  NEUTROABS 1.7  HCT 36.3  MCV 91.4  PLT 716   Basic Metabolic Panel: Recent Labs  Lab 01/17/18 1449  NA 134*  K 4.7  CL  101  CO2 25  GLUCOSE 101  BUN 21  CREATININE 1.21*  CALCIUM 13.9*  MG 2.0   GFR: Estimated Creatinine Clearance: 36.5 mL/min (A) (by C-G formula based on SCr of 1.21 mg/dL (H)). Liver Function Tests: Recent Labs  Lab 01/17/18 1449  AST 63*  ALT 32  ALKPHOS 85  BILITOT 0.5  PROT 6.3*  ALBUMIN 3.2*   No results for input(s): LIPASE, AMYLASE in the last 168 hours. No results for input(s): AMMONIA in the last 168 hours. Coagulation Profile: No results for input(s): INR, PROTIME in the last 168 hours. Cardiac Enzymes: No results for input(s): CKTOTAL, CKMB, CKMBINDEX, TROPONINI in the last 168 hours. BNP (last 3 results) No results for input(s): PROBNP in the last 8760 hours. HbA1C: No results for input(s): HGBA1C in the last 72 hours. CBG: No results for input(s): GLUCAP in the last 168 hours. Lipid Profile: No results for input(s): CHOL, HDL, LDLCALC, TRIG, CHOLHDL, LDLDIRECT in the last 72 hours. Thyroid Function Tests: No results for input(s): TSH, T4TOTAL, FREET4, T3FREE, THYROIDAB in the last 72 hours. Anemia Panel: No results for input(s): VITAMINB12, FOLATE, FERRITIN, TIBC, IRON, RETICCTPCT in the last 72 hours. Urine analysis:    Component Value Date/Time   COLORURINE YELLOW 08/18/2016 2344   APPEARANCEUR HAZY (A) 08/18/2016 2344   LABSPEC 1.015 08/18/2016 2344   PHURINE 8.0 08/18/2016 2344   GLUCOSEU NEGATIVE 08/18/2016 2344   HGBUR NEGATIVE 08/18/2016 2344    BILIRUBINUR NEGATIVE 08/18/2016 2344   KETONESUR NEGATIVE 08/18/2016 2344   PROTEINUR NEGATIVE 08/18/2016 2344   NITRITE NEGATIVE 08/18/2016 2344   LEUKOCYTESUR NEGATIVE 08/18/2016 2344   Sepsis Labs: @LABRCNTIP (procalcitonin:4,lacticidven:4) )No results found for this or any previous visit (from the past 240 hour(s)).   Radiological Exams on Admission: No results found.  EKG: Independently reviewed.  Pending  Assessment/Plan Active Problems:   Hypercalcemia   Hypercalcemia not responding to Zometa with abdominal pains, confusion, falls -We will give another dose of Zometa 4 mg IV once now -Continue IV fluids as above -Start calcitonin 4 units/kg -Repeat calcium in a.m. -Consider Xgeva as outpatient as alternative to Zometa (restricted to outpatient use)  Falls 2/2 dehydration probably from poor oral intake and hypercalcemia - Orthostatic vital signs in a.m. - Symptomatically orthostatic on exam currently -PT evaluation tomorrow  Orthostatic hypotension and mild elevation of creatinine due to dehydration, BP improving with IVF -2 L IV fluid bolus followed by IV fluids overnight -Creatinine in a.m. - hold losartan and norvasc  Poor oral intake due to recent chemotherapy -  IVF -  Nutrition consult -  Supplements -  Regular diet  Breast cancer, recurrent -  Management per Dr. Jana Hakim  -  Continue anastrozle  Portal vein thrombus -  Continue xarelto (consider changing to apixaban which has better data and outcomes in malignancy but will defer to ONC)  OSA, stable, continue   COPD, stable, continue incruse ellipta  Anxiety, stable, continue ativan prn and celexa  Borderline diabetes, last A1c < 6 -  Hold metformin for now and no insulin unless CBG > 140 consistently  DVT prophylaxis: xarelto  Code Status: full code Family Communication:  Patient and her son and brother Disposition Plan: likely to home  Consults called: none  Admission status: inpatient,  telemetry due to    Janece Canterbury MD [facility] Pager [phone]  If 7PM-7AM, please contact night-coverage [email or web address] Password [facility]  01/17/2018, 5:41 PM

## 2018-01-17 NOTE — Telephone Encounter (Signed)
Patient received 761ml out of 1L bolus. Patient and family taken to admissions.  Cyndia Bent RN

## 2018-01-17 NOTE — Patient Instructions (Signed)
Dehydration, Adult Dehydration is when there is not enough fluid or water in your body. This happens when you lose more fluids than you take in. Dehydration can range from mild to very bad. It should be treated right away to keep it from getting very bad. Symptoms of mild dehydration may include:  Thirst.  Dry lips.  Slightly dry mouth.  Dry, warm skin.  Dizziness. Symptoms of moderate dehydration may include:  Very dry mouth.  Muscle cramps.  Dark pee (urine). Pee may be the color of tea.  Your body making less pee.  Your eyes making fewer tears.  Heartbeat that is uneven or faster than normal (palpitations).  Headache.  Light-headedness, especially when you stand up from sitting.  Fainting (syncope). Symptoms of very bad dehydration may include:  Changes in skin, such as: ? Cold and clammy skin. ? Blotchy (mottled) or pale skin. ? Skin that does not quickly return to normal after being lightly pinched and let go (poor skin turgor).  Changes in body fluids, such as: ? Feeling very thirsty. ? Your eyes making fewer tears. ? Not sweating when body temperature is high, such as in hot weather. ? Your body making very little pee.  Changes in vital signs, such as: ? Weak pulse. ? Pulse that is more than 100 beats a minute when you are sitting still. ? Fast breathing. ? Low blood pressure.  Other changes, such as: ? Sunken eyes. ? Cold hands and feet. ? Confusion. ? Lack of energy (lethargy). ? Trouble waking up from sleep. ? Short-term weight loss. ? Unconsciousness. Follow these instructions at home:  If told by your doctor, drink an ORS: ? Make an ORS by using instructions on the package. ? Start by drinking small amounts, about  cup (120 mL) every 5-10 minutes. ? Slowly drink more until you have had the amount that your doctor said to have.  Drink enough clear fluid to keep your pee clear or pale yellow. If you were told to drink an ORS, finish the ORS  first, then start slowly drinking clear fluids. Drink fluids such as: ? Water. Do not drink only water by itself. Doing that can make the salt (sodium) level in your body get too low (hyponatremia). ? Ice chips. ? Fruit juice that you have added water to (diluted). ? Low-calorie sports drinks.  Avoid: ? Alcohol. ? Drinks that have a lot of sugar. These include high-calorie sports drinks, fruit juice that does not have water added, and soda. ? Caffeine. ? Foods that are greasy or have a lot of fat or sugar.  Take over-the-counter and prescription medicines only as told by your doctor.  Do not take salt tablets. Doing that can make the salt level in your body get too high (hypernatremia).  Eat foods that have minerals (electrolytes). Examples include bananas, oranges, potatoes, tomatoes, and spinach.  Keep all follow-up visits as told by your doctor. This is important. Contact a doctor if:  You have belly (abdominal) pain that: ? Gets worse. ? Stays in one area (localizes).  You have a rash.  You have a stiff neck.  You get angry or annoyed more easily than normal (irritability).  You are more sleepy than normal.  You have a harder time waking up than normal.  You feel: ? Weak. ? Dizzy. ? Very thirsty.  You have peed (urinated) only a small amount of very dark pee during 6-8 hours. Get help right away if:  You have symptoms of   very bad dehydration.  You cannot drink fluids without throwing up (vomiting).  Your symptoms get worse with treatment.  You have a fever.  You have a very bad headache.  You are throwing up or having watery poop (diarrhea) and it: ? Gets worse. ? Does not go away.  You have blood or something green (bile) in your throw-up.  You have blood in your poop (stool). This may cause poop to look black and tarry.  You have not peed in 6-8 hours.  You pass out (faint).  Your heart rate when you are sitting still is more than 100 beats a  minute.  You have trouble breathing. This information is not intended to replace advice given to you by your health care provider. Make sure you discuss any questions you have with your health care provider. Document Released: 07/28/2009 Document Revised: 04/20/2016 Document Reviewed: 11/25/2015 Elsevier Interactive Patient Education  2018 [employer]  

## 2018-01-17 NOTE — Telephone Encounter (Signed)
Called report to 4W for patients admission.  Cyndia Bent RN

## 2018-01-18 DIAGNOSIS — G9341 Metabolic encephalopathy: Secondary | ICD-10-CM

## 2018-01-18 DIAGNOSIS — R627 Adult failure to thrive: Secondary | ICD-10-CM

## 2018-01-18 LAB — CBC
HCT: 35.2 % — ABNORMAL LOW (ref 36.0–46.0)
Hemoglobin: 11 g/dL — ABNORMAL LOW (ref 12.0–15.0)
MCH: 29.5 pg (ref 26.0–34.0)
MCHC: 31.3 g/dL (ref 30.0–36.0)
MCV: 94.4 fL (ref 78.0–100.0)
PLATELETS: 278 10*3/uL (ref 150–400)
RBC: 3.73 MIL/uL — AB (ref 3.87–5.11)
RDW: 15.9 % — AB (ref 11.5–15.5)
WBC: 3.5 10*3/uL — AB (ref 4.0–10.5)

## 2018-01-18 LAB — BASIC METABOLIC PANEL
Anion gap: 6 (ref 5–15)
BUN: 15 mg/dL (ref 6–20)
CALCIUM: 11.5 mg/dL — AB (ref 8.9–10.3)
CO2: 26 mmol/L (ref 22–32)
CREATININE: 0.92 mg/dL (ref 0.44–1.00)
Chloride: 109 mmol/L (ref 101–111)
GFR calc non Af Amer: >60 mL/min (ref >60)
Glucose, Bld: 117 mg/dL — ABNORMAL HIGH (ref 65–99)
Potassium: 3.5 mmol/L (ref 3.5–5.1)
SODIUM: 141 mmol/L (ref 135–145)

## 2018-01-18 NOTE — Progress Notes (Signed)
PT Note  Patient Details Name: Christina Horn MRN: [id number] DOB: November 20, 1945   Pt sleeping soundly. Spoke with nursing staff , pt was up in recliner today and now just back to bed. Per nursing staff pt with contact guard assist to get up and mobilize to recliner today with no AD.   Will assess pt tomorrow.   Clide Dales 01/18/2018, 5:12 PM  Clide Dales, PT Pager: [phone] 01/18/2018

## 2018-01-18 NOTE — Progress Notes (Signed)
PROGRESS NOTE  Christina Horn WPY:[id number] DOB: 05-04-1946 DOA: 01/17/2018 PCP: [name], MD  HPI/Recap of past 24 hours:  Feeling better, sitting in chair, denies pain, no fever, no sob No confusion  Assessment/Plan: Active Problems:   Hypercalcemia  Hypercalcemia in the setting of maligancy -presented with progressive weakness, abdominal pains, confusion, falls, dehydration -she received several dose of Zometa from oncology clinic but calcium continue to rise  -s/p Zometa 4 mg IV x1 on admission,  -Continue IV fluids -continue calcitonin 4 units/kg -Repeat calcium in a.m. -oncology to decide on denosumab  AKI on ckd II: -Bun /cr on presentation is 21/1.2 -hold cozaar, hold metformin -improving on hydration, bun/cr today 15/0.9 -continue hydration  Metastatic cancer: -liver, lower lung, and retroperitoneal lesions -Liver biopsy: Poorly differentiated carcinoma, ER PR negative  -S/p eribilin on 3/28 -management per oncology  Abdomen distended, she denies pain, consider ab [place] to r/o ascites   H/o cancer of lower-outer quadrant of left female breast in 2012 s/p left mastectomy, adjuvant chemo and XRT, maintained on arimidex  HTN: presented with dehydration/aki -hold home bp meds norvasc/cozaar. Continue asa  Portal vein thrombus -  Continue xarelto (consider changing to apixaban which has better data and outcomes in malignancy but will defer to ONC)  OSA, stable, continue   COPD, stable, continue incruse ellipta  Anxiety, stable, continue ativan prn and celexa  Borderline diabetes, last A1c < 6 -  Hold metformin for now and no insulin unless CBG > 140 consistently  FTT: will get PT eval  Code Status: full  Family Communication: patient   Disposition Plan: not ready to discharge   Consultants:  oncology  Procedures:  none  Antibiotics:  none   Objective: BP (!) 130/50 (BP Location: Right Wrist)   Pulse 76   Temp 97.6 F (36.4 C)  (Oral)   Resp 18   Ht 4\' 11"  (1.499 m)   Wt 72.6 kg (160 lb 0.9 oz)   SpO2 98%   BMI 32.33 kg/m   Intake/Output Summary (Last 24 hours) at 01/18/2018 1642 Last data filed at 01/18/2018 1100 Gross per 24 hour  Intake 3820.41 ml  Output 2400 ml  Net 1420.41 ml   Filed Weights   01/17/18 1642  Weight: 72.6 kg (160 lb 0.9 oz)    Exam: Patient is examined daily including today on 01/18/2018, exams remain the same as of yesterday except that has changed    General:  Frail elderly, NAD  Cardiovascular: RRR  Respiratory: CTABL, chest port inplace  Abdomen: Soft/ND/NT, positive BS  Musculoskeletal: No Edema  Neuro: alert, oriented   Data Reviewed: Basic Metabolic Panel: Recent Labs  Lab 01/17/18 1449 01/18/18 0411  NA 134* 141  K 4.7 3.5  CL 101 109  CO2 25 26  GLUCOSE 101 117*  BUN 21 15  CREATININE 1.21* 0.92  CALCIUM 13.9* 11.5*  MG 2.0  --    Liver Function Tests: Recent Labs  Lab 01/17/18 1449  AST 63*  ALT 32  ALKPHOS 85  BILITOT 0.5  PROT 6.3*  ALBUMIN 3.2*   No results for input(s): LIPASE, AMYLASE in the last 168 hours. No results for input(s): AMMONIA in the last 168 hours. CBC: Recent Labs  Lab 01/17/18 1449 01/18/18 0411  WBC 3.3* 3.5*  NEUTROABS 1.7  --   HGB  --  11.0*  HCT 36.3 35.2*  MCV 91.4 94.4  PLT 284 278   Cardiac Enzymes:   No results for input(s): CKTOTAL,  CKMB, CKMBINDEX, TROPONINI in the last 168 hours. BNP (last 3 results) No results for input(s): BNP in the last 8760 hours.  ProBNP (last 3 results) No results for input(s): PROBNP in the last 8760 hours.  CBG: No results for input(s): GLUCAP in the last 168 hours.  No results found for this or any previous visit (from the past 240 hour(s)).   Studies: No results found.  Scheduled Meds: . anastrozole  1 mg Oral Daily  . aspirin EC  81 mg Oral Daily  . calcitonin  4 Units/kg Subcutaneous BID  . citalopram  20 mg Oral Daily  . feeding supplement (GLUCERNA  SHAKE)  237 mL Oral TID BM  . potassium chloride SA  20 mEq Oral Daily  . rivaroxaban  20 mg Oral Q supper  . umeclidinium bromide  1 puff Inhalation Daily    Continuous Infusions: . sodium chloride 125 mL/hr at 01/18/18 1027     Time spent: 34mins I have personally reviewed and interpreted on  01/18/2018 daily labs, tele strips, imagings as discussed above under date review session and assessment and plans.  I reviewed all nursing notes, pharmacy notes,  vitals, pertinent old records  I have discussed plan of care as described above with RN , patient  on 01/18/2018   Florencia Reasons MD, PhD  [facility] Pager [phone]. If 7PM-7AM, please contact night-coverage at [email or web address], password [facility] 01/18/2018, 4:42 PM  LOS: 1 day

## 2018-01-19 LAB — BASIC METABOLIC PANEL
Anion gap: 5 (ref 5–15)
BUN: 7 mg/dL (ref 6–20)
CHLORIDE: 112 mmol/L — AB (ref 101–111)
CO2: 23 mmol/L (ref 22–32)
Calcium: 10.5 mg/dL — ABNORMAL HIGH (ref 8.9–10.3)
Creatinine, Ser: 0.76 mg/dL (ref 0.44–1.00)
GFR calc Af Amer: >60 mL/min (ref >60)
GFR calc non Af Amer: >60 mL/min (ref >60)
GLUCOSE: 131 mg/dL — AB (ref 65–99)
POTASSIUM: 3.2 mmol/L — AB (ref 3.5–5.1)
SODIUM: 140 mmol/L (ref 135–145)

## 2018-01-19 LAB — CBC WITH DIFFERENTIAL/PLATELET
Basophils Absolute: 0 10*3/uL (ref 0.0–0.1)
Basophils Relative: 1 %
EOS PCT: 0 %
Eosinophils Absolute: 0 10*3/uL (ref 0.0–0.7)
HCT: 36.1 % (ref 36.0–46.0)
Hemoglobin: 11.4 g/dL — ABNORMAL LOW (ref 12.0–15.0)
LYMPHS ABS: 0.9 10*3/uL (ref 0.7–4.0)
LYMPHS PCT: 29 %
MCH: 29.7 pg (ref 26.0–34.0)
MCHC: 31.6 g/dL (ref 30.0–36.0)
MCV: 94 fL (ref 78.0–100.0)
MONO ABS: 0.8 10*3/uL (ref 0.1–1.0)
MONOS PCT: 26 %
Neutro Abs: 1.4 10*3/uL — ABNORMAL LOW (ref 1.7–7.7)
Neutrophils Relative %: 44 %
PLATELETS: 249 10*3/uL (ref 150–400)
RBC: 3.84 MIL/uL — ABNORMAL LOW (ref 3.87–5.11)
RDW: 16.2 % — AB (ref 11.5–15.5)
WBC: 3.2 10*3/uL — ABNORMAL LOW (ref 4.0–10.5)

## 2018-01-19 LAB — HEMOGLOBIN A1C
HEMOGLOBIN A1C: 5.5 % (ref 4.8–5.6)
Mean Plasma Glucose: 111.15 mg/dL

## 2018-01-19 LAB — MAGNESIUM: Magnesium: 1.4 mg/dL — ABNORMAL LOW (ref 1.7–2.4)

## 2018-01-19 MED ORDER — POTASSIUM CHLORIDE CRYS ER 20 MEQ PO TBCR
40.0000 meq | EXTENDED_RELEASE_TABLET | Freq: Once | ORAL | Status: AC
  Administered 2018-01-19: 40 meq via ORAL
  Filled 2018-01-19: qty 2

## 2018-01-19 MED ORDER — MAGNESIUM SULFATE 2 GM/50ML IV SOLN
2.0000 g | Freq: Once | INTRAVENOUS | Status: AC
  Administered 2018-01-19: 2 g via INTRAVENOUS
  Filled 2018-01-19: qty 50

## 2018-01-19 NOTE — Evaluation (Signed)
Physical Therapy Evaluation Patient Details Name: Christina Horn MRN: [id number] DOB: 1946-08-27 Today's Date: 01/19/2018   History of Present Illness  72 yo female admitted with hypercalcemia, falls, orthostatic hypotension. Hx of COPD, DM, breast cancer with mets-on chemo    Clinical Impression  On eval, pt was Min guard assist for mobility. She walked ~100 feet with a RW. Pt presents with general weakness, decreased activity tolerance, and impaired gait and balance. Will follow and progress activity as tolerated. Discussed d/c plan-pt plans to return home. She does not have any assistance available at home.     Follow Up Recommendations Home health PT; Intermittent Supervision/Assist     Equipment Recommendations  None recommended by PT    Recommendations for Other Services       Precautions / Restrictions Precautions Precautions: Fall Restrictions Weight Bearing Restrictions: No      Mobility  Bed Mobility Overal bed mobility: Needs Assistance Bed Mobility: Supine to Sit     Supine to sit: Min guard;HOB elevated     General bed mobility comments: close guard for safety.   Transfers Overall transfer level: Needs assistance Equipment used: Rolling walker (2 wheeled) Transfers: Sit to/from Stand Sit to Stand: Min guard         General transfer comment: close guard for safety. VCs safety,hand placement   Ambulation/Gait Ambulation/Gait assistance: Min guard Ambulation Distance (Feet): 100 Feet Assistive device: Rolling walker (2 wheeled) Gait Pattern/deviations: Step-through pattern;Decreased stride length     General Gait Details: close guard for safety. slow gait speed. Pt fatigued fairly easily.   Stairs            Wheelchair Mobility    Modified Rankin (Stroke Patients Only)       Balance Overall balance assessment: Needs assistance         Standing balance support: Bilateral upper extremity supported Standing balance-Leahy Scale:  Poor                               Pertinent Vitals/Pain Pain Assessment: 0-10 Pain Score: 7  Pain Location: abdomen Pain Descriptors / Indicators: Aching;Sore Pain Intervention(s): Monitored during session;Repositioned    Home Living Family/patient expects to be discharged to:: (P) Private residence Living Arrangements: (P) Alone   Type of Home: (P) Mobile home Home Access: (P) Stairs to enter Entrance Stairs-Rails: (P) Right;Left Entrance Stairs-Number of Steps: (P) 5 Home Layout: (P) One level Home Equipment: (P) Walker - 2 wheels;Cane - single point      Prior Function                 Hand Dominance        Extremity/Trunk Assessment   Upper Extremity Assessment Upper Extremity Assessment: Generalized weakness    Lower Extremity Assessment Lower Extremity Assessment: Generalized weakness    Cervical / Trunk Assessment Cervical / Trunk Assessment: Normal  Communication      Cognition Arousal/Alertness: Awake/alert Behavior During Therapy: WFL for tasks assessed/performed Overall Cognitive Status: Impaired/Different from baseline                                 General Comments: mildly confused. Pt thought there was a box cutter in the bed. She stated "I'm sure I saw something. I hope Im not hallucinating"      General Comments      Exercises  Assessment/Plan    PT Assessment Patient needs continued PT services  PT Problem List Decreased strength;Decreased balance;Decreased mobility;Decreased activity tolerance;Decreased cognition;Decreased knowledge of use of DME       PT Treatment Interventions DME instruction;Gait training;Functional mobility training;Therapeutic activities;Balance training;Patient/family education;Therapeutic exercise    PT Goals (Current goals can be found in the Care Plan section)  Acute Rehab PT Goals Patient Stated Goal: to feet better and go home PT Goal Formulation: With patient Time  For Goal Achievement: 02/02/18 Potential to Achieve Goals: Good    Frequency Min 3X/week   Barriers to discharge Decreased caregiver support      Co-evaluation               AM-PAC PT "6 Clicks" Daily Activity  Outcome Measure Difficulty turning over in bed (including adjusting bedclothes, sheets and blankets)?: A Little Difficulty moving from lying on back to sitting on the side of the bed? : A Little Difficulty sitting down on and standing up from a chair with arms (e.g., wheelchair, bedside commode, etc,.)?: A Little Help needed moving to and from a bed to chair (including a wheelchair)?: A Little Help needed walking in hospital room?: A Little Help needed climbing 3-5 steps with a railing? : A Little 6 Click Score: 18    End of Session Equipment Utilized During Treatment: Gait belt Activity Tolerance: Patient tolerated treatment well Patient left: in chair;with call bell/phone within reach   PT Visit Diagnosis: Muscle weakness (generalized) (M62.81);Difficulty in walking, not elsewhere classified (R26.2)    Time: 1194-1740 PT Time Calculation (min) (ACUTE ONLY): 23 min   Charges:   PT Evaluation $PT Eval Moderate Complexity: 1 Mod PT Treatments $Gait Training: 8-22 mins   PT G Codes:          Weston Anna, MPT Pager: [phone]

## 2018-01-19 NOTE — Progress Notes (Signed)
Initial Nutrition Assessment  DOCUMENTATION CODES:   Obesity unspecified  INTERVENTION:   Continue Glucerna Shake po TID, each supplement provides 220 kcal and 10 grams of protein  NUTRITION DIAGNOSIS:   Increased nutrient needs related to cancer and cancer related treatments as evidenced by estimated needs.  GOAL:   Patient will meet greater than or equal to 90% of their needs  MONITOR:   PO intake, Supplement acceptance, Labs, Weight trends, I & O's  REASON FOR ASSESSMENT:   Consult Assessment of nutrition requirement/status  ASSESSMENT:   72 y.o. female with history of breast cancer of the left breast initially treated with left mastectomy, chemo and radiation in 2012 with a relapse discovered on CT in January 2019, COPD, borderline diabetes mellitus, hypertension, obstructive sleep apnea, anxiety who presents with fatigue, fuzzy thinking and dehydration.  Patient reports poor appetite and foods not tasting right since chemotherapy began in February 2019. Pt states she has had nausea since the beginning as well. She denies vomiting, states she is just coughing phlegm up mostly. Pt states she swallows fine but does have a hard time with breads.  Per chart, pt has been eating 100% of meals. Pt seemed to have no appetite today. States she drinks Glucerna shakes at home and had an unopened bottle at bedside. Encouraged pt to open supplement soon.  Per patient, UBW is ~175 lb. Pt has lost 9% wt loss x 1.5 months, significant for time frame.   Medications: K-DUR tablet daily Labs reviewed: Low K, Mg   NUTRITION - FOCUSED PHYSICAL EXAM:  Nutrition focused physical exam shows no sign of depletion of muscle mass or body fat.  Diet Order:  Diet regular Room service appropriate? Yes; Fluid consistency: Thin  EDUCATION NEEDS:   No education needs have been identified at this time  Skin:  Skin Assessment: Reviewed RN Assessment  Last BM:  4/7  Height:   Ht Readings from  Last 1 Encounters:  01/17/18 4\' 11"  (1.499 m)    Weight:   Wt Readings from Last 1 Encounters:  01/17/18 160 lb 0.9 oz (72.6 kg)    Ideal Body Weight:  44.7 kg  BMI:  Body mass index is 32.33 kg/m.  Estimated Nutritional Needs:   Kcal:  1600-1800  Protein:  65-75g  Fluid:  1.8L/day  Clayton Bibles, MS, RD, LDN [facility] Pager: [phone] After Hours Pager: [phone]

## 2018-01-19 NOTE — Progress Notes (Signed)
PROGRESS NOTE  Christina Horn BHA:[id number] DOB: 1945-12-17 DOA: 01/17/2018 PCP: [name], MD  HPI/Recap of past 24 hours:  Feeling better, sitting in chair, denies pain, no fever, no sob No confusion  Assessment/Plan: Active Problems:   Hypercalcemia  Hypercalcemia in the setting of malignancy/metabolic encephalopathy -she received several dose of Zometa from oncology clinic but calcium continue to rise, she is sent from oncology clinic to the hospital due to hypercalcemia, progressive weakness, abdominal pains, confusion, falls, dehydration --s/p Zometa 4 mg IV x1 on admission,  -s/p calcitonin 4 units/kg bid , last dose on 4/7. -ca 14.6 on admission, coming down to 10.5 today on 4/7. Confusion resolved, Continue IV fluids for another 24hrs, Repeat calcium in a.m. -oncology to decide on denosumab  AKI on CKD II: -Bun /cr on presentation is 21/1.2 --improving on hydration, bun/cr 7/0.7  On 4/7 -continue hydration for another 24hrs, continue to hold metformin, resume cozaar today on 4/7 -repeat lab in am  Hypokalemia/hypomagnesemia: Replace k/mag, repeat lab in am  Metastatic cancer: -liver, lower lung, and retroperitoneal lesions -Liver biopsy: Poorly differentiated carcinoma, ER PR negative  -S/p eribilin on 3/28 -management per oncology  Abdomen distended, she denies pain, consider ab [place]  If symptomatic.   H/o cancer of lower-outer quadrant of left female breast in 2012 s/p left mastectomy, adjuvant chemo and XRT, maintained on arimidex  HTN:  presented with hypotension (sbp in the 80's)/dehydration/aki -home bp meds norvasc/cozaar held on admission, bp improving, resume low dose cozaar, continue hold norvasc -Continue asa  Portal vein thrombus -  Continue xarelto (consider changing to apixaban which has better data and outcomes in malignancy but will defer to ONC)  OSA, stable, continue cpap  COPD, stable, continue incruse ellipta  Anxiety, stable,  continue ativan prn and celexa  Borderline diabetes,  A1c < 6 -  Hold metformin for now and no insulin unless CBG > 140 consistently  FTT:  PT eval  Code Status: full  Family Communication: patient   Disposition Plan: home with home health in 24-48hrs   Consultants:  oncology  Procedures:  none  Antibiotics:  none   Objective: BP (!) 122/48 (BP Location: Right Arm)   Pulse 85   Temp 98.2 F (36.8 C) (Oral)   Resp 18   Ht 4\' 11"  (1.499 m)   Wt 72.6 kg (160 lb 0.9 oz)   SpO2 95%   BMI 32.33 kg/m   Intake/Output Summary (Last 24 hours) at 01/19/2018 0819 Last data filed at 01/19/2018 0500 Gross per 24 hour  Intake 3880.42 ml  Output 2300 ml  Net 1580.42 ml   Filed Weights   01/17/18 1642  Weight: 72.6 kg (160 lb 0.9 oz)    Exam: Patient is examined daily including today on 01/19/2018, exams remain the same as of yesterday except that has changed    General:  Frail elderly, NAD, aaox3  Cardiovascular: RRR  Respiratory: CTABL, chest port inplace  Abdomen: ab distended, nontender, Soft/ND/NT, positive BS  Musculoskeletal: No Edema  Neuro: alert, oriented   Data Reviewed: Basic Metabolic Panel: Recent Labs  Lab 01/17/18 1449 01/18/18 0411 01/19/18 0504  NA 134* 141 140  K 4.7 3.5 3.2*  CL 101 109 112*  CO2 25 26 23   GLUCOSE 101 117* 131*  BUN 21 15 7   CREATININE 1.21* 0.92 0.76  CALCIUM 13.9* 11.5* 10.5*  MG 2.0  --  1.4*   Liver Function Tests: Recent Labs  Lab 01/17/18 1449  AST 63*  ALT 32  ALKPHOS 85  BILITOT 0.5  PROT 6.3*  ALBUMIN 3.2*   No results for input(s): LIPASE, AMYLASE in the last 168 hours. No results for input(s): AMMONIA in the last 168 hours. CBC: Recent Labs  Lab 01/17/18 1449 01/18/18 0411 01/19/18 0504  WBC 3.3* 3.5* 3.2*  NEUTROABS 1.7  --  1.4*  HGB  --  11.0* 11.4*  HCT 36.3 35.2* 36.1  MCV 91.4 94.4 94.0  PLT 284 278 249   Cardiac Enzymes:   No results for input(s): CKTOTAL, CKMB, CKMBINDEX,  TROPONINI in the last 168 hours. BNP (last 3 results) No results for input(s): BNP in the last 8760 hours.  ProBNP (last 3 results) No results for input(s): PROBNP in the last 8760 hours.  CBG: No results for input(s): GLUCAP in the last 168 hours.  No results found for this or any previous visit (from the past 240 hour(s)).   Studies: No results found.  Scheduled Meds: . anastrozole  1 mg Oral Daily  . aspirin EC  81 mg Oral Daily  . calcitonin  4 Units/kg Subcutaneous BID  . citalopram  20 mg Oral Daily  . feeding supplement (GLUCERNA SHAKE)  237 mL Oral TID BM  . potassium chloride SA  20 mEq Oral Daily  . potassium chloride  40 mEq Oral Once  . rivaroxaban  20 mg Oral Q supper  . umeclidinium bromide  1 puff Inhalation Daily    Continuous Infusions: . sodium chloride 125 mL/hr (01/19/18 0205)  . magnesium sulfate 1 - 4 g bolus IVPB       Time spent: 93mins I have personally reviewed and interpreted on  01/19/2018 daily labs, tele strips, imagings as discussed above under date review session and assessment and plans.  I reviewed all nursing notes, pharmacy notes,  vitals, pertinent old records  I have discussed plan of care as described above with RN , patient  on 01/19/2018   Florencia Reasons MD, PhD  [facility] Pager [phone]. If 7PM-7AM, please contact night-coverage at [email or web address], password [facility] 01/19/2018, 8:19 AM  LOS: 2 days

## 2018-01-20 ENCOUNTER — Telehealth: Payer: Self-pay | Admitting: Adult Health

## 2018-01-20 DIAGNOSIS — C799 Secondary malignant neoplasm of unspecified site: Secondary | ICD-10-CM

## 2018-01-20 DIAGNOSIS — N179 Acute kidney failure, unspecified: Secondary | ICD-10-CM

## 2018-01-20 DIAGNOSIS — I10 Essential (primary) hypertension: Secondary | ICD-10-CM

## 2018-01-20 DIAGNOSIS — R410 Disorientation, unspecified: Secondary | ICD-10-CM

## 2018-01-20 DIAGNOSIS — R531 Weakness: Secondary | ICD-10-CM

## 2018-01-20 DIAGNOSIS — W19XXXA Unspecified fall, initial encounter: Secondary | ICD-10-CM

## 2018-01-20 DIAGNOSIS — E860 Dehydration: Secondary | ICD-10-CM

## 2018-01-20 DIAGNOSIS — I951 Orthostatic hypotension: Secondary | ICD-10-CM

## 2018-01-20 LAB — BASIC METABOLIC PANEL
ANION GAP: 6 (ref 5–15)
BUN: <5 mg/dL — ABNORMAL LOW (ref 6–20)
CHLORIDE: 114 mmol/L — AB (ref 101–111)
CO2: 19 mmol/L — AB (ref 22–32)
Calcium: 9.9 mg/dL (ref 8.9–10.3)
Creatinine, Ser: 0.61 mg/dL (ref 0.44–1.00)
GFR calc non Af Amer: >60 mL/min (ref >60)
Glucose, Bld: 112 mg/dL — ABNORMAL HIGH (ref 65–99)
POTASSIUM: 3.5 mmol/L (ref 3.5–5.1)
Sodium: 139 mmol/L (ref 135–145)

## 2018-01-20 LAB — CBC WITH DIFFERENTIAL/PLATELET
BASOS ABS: 0 10*3/uL (ref 0.0–0.1)
BASOS PCT: 1 %
Eosinophils Absolute: 0 10*3/uL (ref 0.0–0.7)
Eosinophils Relative: 0 %
HEMATOCRIT: 36.8 % (ref 36.0–46.0)
HEMOGLOBIN: 11.4 g/dL — AB (ref 12.0–15.0)
LYMPHS PCT: 20 %
Lymphs Abs: 0.8 10*3/uL (ref 0.7–4.0)
MCH: 29.5 pg (ref 26.0–34.0)
MCHC: 31 g/dL (ref 30.0–36.0)
MCV: 95.3 fL (ref 78.0–100.0)
Monocytes Absolute: 1.1 10*3/uL — ABNORMAL HIGH (ref 0.1–1.0)
Monocytes Relative: 29 %
NEUTROS ABS: 1.9 10*3/uL (ref 1.7–7.7)
NEUTROS PCT: 50 %
Platelets: 220 10*3/uL (ref 150–400)
RBC: 3.86 MIL/uL — ABNORMAL LOW (ref 3.87–5.11)
RDW: 16.6 % — ABNORMAL HIGH (ref 11.5–15.5)
WBC: 3.8 10*3/uL — ABNORMAL LOW (ref 4.0–10.5)

## 2018-01-20 LAB — MAGNESIUM: Magnesium: 1.6 mg/dL — ABNORMAL LOW (ref 1.7–2.4)

## 2018-01-20 MED ORDER — RIVAROXABAN 20 MG PO TABS: 20.0000 mg | ORAL_TABLET | Freq: Every day | ORAL | 3 refills | Status: DC

## 2018-01-20 MED ORDER — MAGNESIUM SULFATE 2 GM/50ML IV SOLN
2.0000 g | Freq: Once | INTRAVENOUS | Status: AC
  Administered 2018-01-20: 2 g via INTRAVENOUS
  Filled 2018-01-20: qty 50

## 2018-01-20 MED ORDER — HEPARIN SOD (PORK) LOCK FLUSH 100 UNIT/ML IV SOLN
500.0000 [IU] | INTRAVENOUS | Status: AC | PRN
  Administered 2018-01-20: 500 [IU]

## 2018-01-20 MED ORDER — POTASSIUM CHLORIDE CRYS ER 20 MEQ PO TBCR
40.0000 meq | EXTENDED_RELEASE_TABLET | Freq: Two times a day (BID) | ORAL | Status: DC
  Administered 2018-01-20: 40 meq via ORAL
  Filled 2018-01-20: qty 2

## 2018-01-20 NOTE — Care Management Important Message (Signed)
Important Message  Patient Details  Name: Christina Horn MRN: [id number] Date of Birth: [birth year]   Medicare Important Message Given:  Yes    Kerin Salen 01/20/2018, 10:30 AMImportant Message  Patient Details  Name: Christina Horn MRN: [id number] Date of Birth: 1946-07-19   Medicare Important Message Given:  Yes    Kerin Salen 01/20/2018, 10:30 AM

## 2018-01-20 NOTE — Progress Notes (Signed)
Physical Therapy Treatment Patient Details Name: Christina Horn MRN: [id number] DOB: 11/08/1945 Today's Date: 01/20/2018    History of Present Illness 72 yo female admitted with hypercalcemia, falls, orthostatic hypotension. Hx of COPD, DM, breast cancer with mets-on chemo    PT Comments    Pt tolerated increased ambulation distance of 160' with RW, no loss of balance, no dizziness. Distance limited by 2/4 dyspnea (pt reports this is baseline 2* COPD). Pt is progressing well and reports she's close to her baseline with mobility.  Follow Up Recommendations  Home health PT     Equipment Recommendations  None recommended by PT    Recommendations for Other Services       Precautions / Restrictions Precautions Precautions: Fall Precaution Comments: h/o 3 falls in past 1 year Restrictions Weight Bearing Restrictions: No    Mobility  Bed Mobility               General bed mobility comments: up in recliner  Transfers Overall transfer level: Needs assistance Equipment used: Rolling walker (2 wheeled) Transfers: Sit to/from Stand Sit to Stand: Supervision         General transfer comment:  VCs safety,hand placement   Ambulation/Gait Ambulation/Gait assistance: Supervision Ambulation Distance (Feet): 160 Feet Assistive device: Rolling walker (2 wheeled) Gait Pattern/deviations: Step-through pattern;Decreased stride length   Gait velocity interpretation: Below normal speed for age/gender General Gait Details:  slow gait speed. Pt fatigued fairly easily. 2/4 dyspnea (h/o COPD, uses O2 at night only), no loss of balance   Stairs            Wheelchair Mobility    Modified Rankin (Stroke Patients Only)       Balance Overall balance assessment: Needs assistance   Sitting balance-Leahy Scale: Good     Standing balance support: Bilateral upper extremity supported Standing balance-Leahy Scale: Fair                              Cognition  Arousal/Alertness: Awake/alert Behavior During Therapy: WFL for tasks assessed/performed Overall Cognitive Status: Within Functional Limits for tasks assessed                                        Exercises      General Comments        Pertinent Vitals/Pain Pain Assessment: No/denies pain    Home Living                      Prior Function            PT Goals (current goals can now be found in the care plan section) Acute Rehab PT Goals Patient Stated Goal: to feet better and go home, likes to shop at [facility] PT Goal Formulation: With patient Time For Goal Achievement: 02/02/18 Potential to Achieve Goals: Good Progress towards PT goals: Progressing toward goals    Frequency    Min 3X/week      PT Plan Current plan remains appropriate    Co-evaluation              AM-PAC PT "6 Clicks" Daily Activity  Outcome Measure  Difficulty turning over in bed (including adjusting bedclothes, sheets and blankets)?: A Little Difficulty moving from lying on back to sitting on the side of the bed? : A Little Difficulty sitting down  on and standing up from a chair with arms (e.g., wheelchair, bedside commode, etc,.)?: A Little Help needed moving to and from a bed to chair (including a wheelchair)?: A Little Help needed walking in hospital room?: A Little Help needed climbing 3-5 steps with a railing? : A Little 6 Click Score: 18    End of Session Equipment Utilized During Treatment: Gait belt Activity Tolerance: Patient tolerated treatment well Patient left: in chair;with call bell/phone within reach Nurse Communication: Mobility status PT Visit Diagnosis: Muscle weakness (generalized) (M62.81);Difficulty in walking, not elsewhere classified (R26.2)     Time: 1751-0258 PT Time Calculation (min) (ACUTE ONLY): 15 min  Charges:  $Gait Training: 8-22 mins                    G Codes:          Philomena Doheny 01/20/2018, 11:04  AM [phone]

## 2018-01-20 NOTE — Discharge Summary (Signed)
Physician Discharge Summary  Patient ID: Christina Horn MRN: [id number] DOB/AGE: [birth year] 72 y.o.  Admit date: 01/17/2018 Discharge date: 01/20/2018  Admission Diagnoses: Hypercalcemia Metastatic breast cancer Acute on chronic renal failure HTN Portal vein thrombus Generalized weakness Confusion Falls Dehydration  Discharge Diagnoses:  Active Problems:   Hypercalcemia   Discharged Condition: good  Hospital Course: Christina Horn is a 72 y.o. female with history of breast cancer of the left breast initially treated with left mastectomy, chemo and radiation in 2012 with a relapse discovered on CT in January 2019, COPD, borderline diabetes mellitus, hypertension, obstructive sleep apnea, anxiety who presents with fatigue, fuzzy thinking and dehydration.  Patient is followed by Dr. Lindi Adie and has been receiving Eribulin chemotherapy since February, last chemo on 01/09/2018.  Her course has been complicated by a portal vein thrombus for which she has on anticoagulation and persistent hypercalcemia for which she has been receiving frequent Zometa, last dose on 3/28.  Recently she developed increasing nausea and was unable to tolerate much food or drink.  She has become increasingly weak and she has twice passed out when standing up.  She hadn't been eating or drinking much over the course of the day.  She was seen in the oncology office and was found to be weak, hypotensive and obviously dehydrated.  She was started on IV fluids and given 750 mL of saline and then directly admitted to [facility] long hospital because her calcium remains elevated.  Review of Systems:  She denied fevers or chills.  She had been coughing a little bit with some phlegm w/ dry lips and a sore belly which had been going on since the weekend.  She denied constipation or diarrhea.    Problems:    1) Hypercalcemia in the setting of malignancy/metabolic encephalopathy -she received several dose of Zometa from oncology  clinic but calcium continued to rise, she was sent from oncology clinic to the hospital due to hypercalcemia, progressive weakness, abdominal pains, confusion, falls, dehydration --s/p Zometa 4 mg IV x1 on admission,  - s/p calcitonin 4 units/kg bid , last dose on 4/7 - Ca 14.6 on admission, came down to 9.9 on 4/8.  - Confusion resolved, ready for dc on 4/8, have d/w oncologist  AKI on CKD II:  - bun /cr on presentation is 21/1.2 --improving on hydration, bun/cr 7/0.7  On 4/7 - metformin/ cozaar held, will resume at dc  Hypokalemia/hypomagnesemia: Replace k/mag, repeat lab in am  Metastatic cancer: -liver, lower lung, and retroperitoneal lesions -Liver biopsy: Poorly differentiated carcinoma, ER PR negative  -S/p eribilin on 3/28 -management per oncology  H/o cancer of lower-outer quadrant of left female breast in 2012 s/p left mastectomy, adjuvant chemo and XRT, maintained on arimidex  HTN:  presented with hypotension (sbp in the 80's)/dehydration/aki -home bp meds norvasc/cozaar held on admission, bp improved, will resume at dc -Continue asa  Portal vein thrombus - Continue xarelto  OSA, stable, continue cpap  COPD, stable, continue incruse ellipta  Anxiety, stable, continue ativan prn and celexa  Borderline diabetes,  A1c < 6 - Hold metformin for now and no insulin unless CBG >140 consistently  FTT:  PT eval  Code Status: full  Procedures:  none  Antibiotics:  none      Discharge Exam: Blood pressure 114/64, pulse 92, temperature 97.7 F (36.5 C), temperature source Oral, resp. rate 19, height 4\' 11"  (1.499 m), weight 72.6 kg (160 lb 0.9 oz), SpO2 97 %.  General:  Frail elderly,  NAD, aaox3  Cardiovascular: RRR  Respiratory: CTABL, chest port inplace  Abdomen: ab distended, nontender, Soft/ND/NT, positive BS  Musculoskeletal: No Edema  Neuro: alert, oriented      Disposition: Discharge disposition: 01-Home or Self  Care        Allergies as of 01/20/2018   No Known Allergies     Medication List    TAKE these medications   alendronate 70 MG tablet Commonly known as:  FOSAMAX Take 70 mg by mouth once a week.   amLODipine 5 MG tablet Commonly known as:  NORVASC Take 5 mg by mouth daily.   anastrozole 1 MG tablet Commonly known as:  ARIMIDEX Take 1 tablet (1 mg total) by mouth daily.   aspirin 81 MG tablet Take 81 mg by mouth daily.   citalopram 20 MG tablet Commonly known as:  CELEXA Take 20 mg by mouth daily.   INCRUSE ELLIPTA 62.5 MCG/INH Aepb Generic drug:  umeclidinium bromide Inhale 1 puff into the lungs daily.   KLOR-CON M20 20 MEQ tablet Generic drug:  potassium chloride SA Take 1 tablet by mouth daily.   lidocaine-prilocaine cream Commonly known as:  EMLA Apply to affected area once   losartan 25 MG tablet Commonly known as:  COZAAR Take 25 mg by mouth daily.   Melatonin 3 MG Caps Take 3 mg by mouth at bedtime.   metFORMIN 500 MG tablet Commonly known as:  GLUCOPHAGE Take 500 mg by mouth 2 (two) times daily with a meal.   ondansetron 8 MG tablet Commonly known as:  ZOFRAN Take 1 tablet (8 mg total) by mouth 2 (two) times daily as needed (Nausea or vomiting).   oxybutynin 5 MG tablet Commonly known as:  DITROPAN Take 5 mg by mouth every 8 (eight) hours as needed for bladder spasms.   prochlorperazine 10 MG tablet Commonly known as:  COMPAZINE Take 1 tablet (10 mg total) by mouth every 6 (six) hours as needed (Nausea or vomiting).   rivaroxaban 20 MG Tabs tablet Commonly known as:  XARELTO Take 1 tablet (20 mg total) by mouth daily with supper.   VITAMIN D (CHOLECALCIFEROL) PO Take 1 tablet by mouth daily.            Durable Medical Equipment  (From admission, onward)        Start     Ordered   01/20/18 1240  For home use only DME Walker rolling  Once    Question:  Patient needs a walker to treat with the following condition  Answer:   Balance problem   01/20/18 1240       Signed: Sandy Salaam Fahima Cifelli 01/20/2018, 4:23 PM

## 2018-01-20 NOTE — Telephone Encounter (Signed)
Per 4/5 no los 

## 2018-01-21 NOTE — Assessment & Plan Note (Signed)
Left breast invasive ductal carcinoma T3 N1 M0 status post mastectomy, Oncotype DX score 28 status post 4 cycles of Taxotere and Cytoxan followed by chest wall radiation and Arimidex 1 mg daily since March 2013  New lesions on CT A/P show liver, lower lung, and retroperitoneal lesions Liver biopsy: Poorly differentiated carcinoma ER PR negative  PET/CT scan: Metastatic disease involving bilateral lungs, hilar and mediastinal nodes, liver metastases extremely large size, retroperitoneal nodes   Current Treatment: Cycle 3 Eribulin   Zometa on 2/6, 3/7, 3/20, and 3/28.

## 2018-01-21 NOTE — Progress Notes (Signed)
Pt declined [place] on yesterday 4/8/ and again today after calling pt at home [phone].

## 2018-01-22 ENCOUNTER — Inpatient Hospital Stay: Payer: Medicare Other

## 2018-01-22 ENCOUNTER — Inpatient Hospital Stay (HOSPITAL_BASED_OUTPATIENT_CLINIC_OR_DEPARTMENT_OTHER): Payer: Medicare Other | Admitting: Hematology and Oncology

## 2018-01-22 DIAGNOSIS — Z170 Estrogen receptor positive status [ER+]: Secondary | ICD-10-CM

## 2018-01-22 DIAGNOSIS — C787 Secondary malignant neoplasm of liver and intrahepatic bile duct: Secondary | ICD-10-CM

## 2018-01-22 DIAGNOSIS — C778 Secondary and unspecified malignant neoplasm of lymph nodes of multiple regions: Secondary | ICD-10-CM | POA: Diagnosis not present

## 2018-01-22 DIAGNOSIS — C50512 Malignant neoplasm of lower-outer quadrant of left female breast: Secondary | ICD-10-CM

## 2018-01-22 DIAGNOSIS — Z5111 Encounter for antineoplastic chemotherapy: Secondary | ICD-10-CM | POA: Diagnosis not present

## 2018-01-22 DIAGNOSIS — Z9181 History of falling: Secondary | ICD-10-CM | POA: Diagnosis not present

## 2018-01-22 DIAGNOSIS — C7800 Secondary malignant neoplasm of unspecified lung: Secondary | ICD-10-CM

## 2018-01-22 DIAGNOSIS — Z171 Estrogen receptor negative status [ER-]: Secondary | ICD-10-CM | POA: Diagnosis not present

## 2018-01-22 LAB — CBC WITH DIFFERENTIAL (CANCER CENTER ONLY)
BASOS ABS: 0.1 10*3/uL (ref 0.0–0.1)
Basophils Relative: 1 %
EOS ABS: 0 10*3/uL (ref 0.0–0.5)
EOS PCT: 0 %
HCT: 37.3 % (ref 34.8–46.6)
HEMOGLOBIN: 12 g/dL (ref 11.6–15.9)
LYMPHS PCT: 19 %
Lymphs Abs: 1.2 10*3/uL (ref 0.9–3.3)
MCH: 29.5 pg (ref 25.1–34.0)
MCHC: 32.3 g/dL (ref 31.5–36.0)
MCV: 91.5 fL (ref 79.5–101.0)
Monocytes Absolute: 0.7 10*3/uL (ref 0.1–0.9)
Monocytes Relative: 11 %
NEUTROS PCT: 69 %
Neutro Abs: 4.4 10*3/uL (ref 1.5–6.5)
PLATELETS: 158 10*3/uL (ref 145–400)
RBC: 4.08 MIL/uL (ref 3.70–5.45)
RDW: 16.4 % — ABNORMAL HIGH (ref 11.2–14.5)
WBC: 6.4 10*3/uL (ref 3.9–10.3)

## 2018-01-22 LAB — CMP (CANCER CENTER ONLY)
ALT: 23 U/L (ref 0–55)
AST: 73 U/L — AB (ref 5–34)
Albumin: 2.8 g/dL — ABNORMAL LOW (ref 3.5–5.0)
Alkaline Phosphatase: 87 U/L (ref 40–150)
Anion gap: 8 (ref 3–11)
BILIRUBIN TOTAL: 0.4 mg/dL (ref 0.2–1.2)
BUN: 9 mg/dL (ref 7–26)
CHLORIDE: 111 mmol/L — AB (ref 98–109)
CO2: 20 mmol/L — ABNORMAL LOW (ref 22–29)
CREATININE: 0.81 mg/dL (ref 0.60–1.10)
Calcium: 9.9 mg/dL (ref 8.4–10.4)
Glucose, Bld: 142 mg/dL — ABNORMAL HIGH (ref 70–140)
Potassium: 3.4 mmol/L — ABNORMAL LOW (ref 3.5–5.1)
Sodium: 139 mmol/L (ref 136–145)
Total Protein: 5.6 g/dL — ABNORMAL LOW (ref 6.4–8.3)

## 2018-01-22 MED ORDER — SODIUM CHLORIDE 0.9% FLUSH
10.0000 mL | Freq: Once | INTRAVENOUS | Status: AC | PRN
  Administered 2018-01-22: 10 mL
  Filled 2018-01-22: qty 10

## 2018-01-22 NOTE — Progress Notes (Signed)
Patient Care Team: [name], MD as PCP - General (Internal Medicine) [name], MD (Internal Medicine) Thea Silversmith, MD (Inactive) as Attending Physician (Radiation Oncology) Christene Slates, MD as Attending Physician (Radiology)  DIAGNOSIS:  Encounter Diagnosis  Name Primary?  . Malignant neoplasm of lower-outer quadrant of left breast of female, estrogen receptor positive ([place])     SUMMARY OF ONCOLOGIC HISTORY:   Breast cancer of lower-outer quadrant of left female breast ([place])   06/07/2011 Surgery     left mastectomy : multiple foci of invasive ductal carcinoma grade 2 5.1 cm and 1 cm with high-grade DCIS, lymphovascular invasion , 1/7 Oscar lymph node ER 100%, PR and her percent, Ki-67 50%, HER-2 equivocal ratio 2 , Oncotype 28      06/20/2011 - 09/11/2011 Chemotherapy    TC X 4      10/08/2011 - 11/13/2011 Radiation Therapy    chest wall radiation therapy       12/25/2011 -  Anti-estrogen oral therapy    Arimidex 1 mg daily       11/12/2017 Relapse/Recurrence    CT scan demonstrates lesion in liver, lung, and retroperitoneal lymph node.  Biopsy of the retroperitoneal lymph node metastatic poorly differentiated carcinoma ER 0%, PR 0%, HER-2 negative ratio 1.23      12/11/2017 -  Chemotherapy    Eribulin given on day 1 and day 8 of a 21 day cycle.       01/17/2018 - 01/20/2018 Hospital Admission    Hypercalcemia causing severe weakness and syncope       CHIEF COMPLIANT: Follow-up after recent hospitalization for severe hypercalcemia, severe fatigue, generalized weakness  INTERVAL HISTORY: CYBILL URIEGAS is a 72 year old lady with above-mentioned history metastatic breast cancer who is currently on [place] chemotherapy.  She was hospitalized because of severe hypercalcemia.  She was given Zometa as well as calcitonin and her calcium levels came down.  Along with that the confusion also improved.  It appears that when she was at home she fell down a couple of times and  had to lay on the floor because she did not have any help.  REVIEW OF SYSTEMS:   Constitutional: Denies fevers, chills or abnormal weight loss Eyes: Denies blurriness of vision Ears, nose, mouth, throat, and face: Denies mucositis or sore throat Respiratory: Denies cough, dyspnea or wheezes Cardiovascular: Denies palpitation, chest discomfort Gastrointestinal: Abdomen is distended Skin: Denies abnormal skin rashes Lymphatics: Denies new lymphadenopathy or easy bruising Neurological:Denies numbness, tingling or new weaknesses Behavioral/Psych: Mood is stable, no new changes  Extremities: No lower extremity edema  All other systems were reviewed with the patient and are negative.  I have reviewed the past medical history, past surgical history, social history and family history with the patient and they are unchanged from previous note.  ALLERGIES:  has No Known Allergies.  MEDICATIONS:  Current Outpatient Medications  Medication Sig Dispense Refill  . anastrozole (ARIMIDEX) 1 MG tablet Take 1 tablet (1 mg total) by mouth daily. 90 tablet 3  . aspirin 81 MG tablet Take 81 mg by mouth daily.      . citalopram (CELEXA) 20 MG tablet Take 20 mg by mouth daily.      . INCRUSE ELLIPTA 62.5 MCG/INH AEPB Inhale 1 puff into the lungs daily.    Marland Kitchen lidocaine-prilocaine (EMLA) cream Apply to affected area once 30 g 3  . metFORMIN (GLUCOPHAGE) 500 MG tablet Take 500 mg by mouth 2 (two) times daily with a meal.    .  ondansetron (ZOFRAN) 8 MG tablet Take 1 tablet (8 mg total) by mouth 2 (two) times daily as needed (Nausea or vomiting). 30 tablet 1  . oxybutynin (DITROPAN) 5 MG tablet Take 5 mg by mouth every 8 (eight) hours as needed for bladder spasms.     . prochlorperazine (COMPAZINE) 10 MG tablet Take 1 tablet (10 mg total) by mouth every 6 (six) hours as needed (Nausea or vomiting). 30 tablet 1   No current facility-administered medications for this visit.     PHYSICAL EXAMINATION: ECOG  PERFORMANCE STATUS: 2 - Symptomatic, <50% confined to bed  Vitals:   01/22/18 1331  BP: (!) 129/59  Pulse: (!) 104  Resp: 17  Temp: 99.2 F (37.3 C)  SpO2: 97%   Filed Weights   01/22/18 1331  Weight: 164 lb 6.4 oz (74.6 kg)    GENERAL:alert, no distress and comfortable SKIN: skin color, texture, turgor are normal, no rashes or significant lesions EYES: normal, Conjunctiva are pink and non-injected, sclera clear OROPHARYNX:no exudate, no erythema and lips, buccal mucosa, and tongue normal  NECK: supple, thyroid normal size, non-tender, without nodularity LYMPH:  no palpable lymphadenopathy in the cervical, axillary or inguinal LUNGS: clear to auscultation and percussion with normal breathing effort HEART: regular rate & rhythm and no murmurs and no lower extremity edema ABDOMEN: Moderate distention same as before MUSCULOSKELETAL:no cyanosis of digits and no clubbing  NEURO: alert & oriented x 3 with fluent speech, no focal motor/sensory deficits EXTREMITIES: No lower extremity edema   LABORATORY DATA:  I have reviewed the data as listed CMP Latest Ref Rng & Units 01/22/2018 01/20/2018 01/19/2018  Glucose 70 - 140 mg/dL 142(H) 112(H) 131(H)  BUN 7 - 26 mg/dL 9 <5(L) 7  Creatinine 0.60 - 1.10 mg/dL 0.81 0.61 0.76  Sodium 136 - 145 mmol/L 139 139 140  Potassium 3.5 - 5.1 mmol/L 3.4(L) 3.5 3.2(L)  Chloride 98 - 109 mmol/L 111(H) 114(H) 112(H)  CO2 22 - 29 mmol/L 20(L) 19(L) 23  Calcium 8.4 - 10.4 mg/dL 9.9 9.9 10.5(H)  Total Protein 6.4 - 8.3 g/dL 5.6(L) - -  Total Bilirubin 0.2 - 1.2 mg/dL 0.4 - -  Alkaline Phos 40 - 150 U/L 87 - -  AST 5 - 34 U/L 73(H) - -  ALT 0 - 55 U/L 23 - -    Lab Results  Component Value Date   WBC 6.4 01/22/2018   HGB 11.4 (L) 01/20/2018   HCT 37.3 01/22/2018   MCV 91.5 01/22/2018   PLT 158 01/22/2018   NEUTROABS 4.4 01/22/2018    ASSESSMENT & PLAN:  Breast cancer of lower-outer quadrant of left female breast ([place]) Left breast invasive  ductal carcinoma T3 N1 M0 status post mastectomy, Oncotype DX score 28 status post 4 cycles of Taxotere and Cytoxan followed by chest wall radiation and Arimidex 1 mg daily since March 2013  New lesions on CT A/P show liver, lower lung, and retroperitoneal lesions Liver biopsy: Poorly differentiated carcinoma ER PR negative, HER-2 negative PET/CT scan: Metastatic disease involving bilateral lungs, hilar and mediastinal nodes, liver metastases extremely large size, retroperitoneal nodes   Current Treatment: Cycle 3 Eribulin Chemo will be held this week.  She will come back next week to see if she can receive chemo.  Zometa on 2/6, 3/7, 3/20, 3/28, 4/5 Patient is here to make a decision as to whether or not she wants to continue with the chemotherapy  No orders of the defined types were placed in this  encounter.  The patient has a good understanding of the overall plan. she agrees with it. she will call with any problems that may develop before the next visit here.   Harriette Ohara, MD 01/22/18

## 2018-01-29 ENCOUNTER — Inpatient Hospital Stay: Payer: Medicare Other

## 2018-01-29 ENCOUNTER — Encounter: Payer: Self-pay | Admitting: Hematology and Oncology

## 2018-01-29 ENCOUNTER — Other Ambulatory Visit: Payer: Self-pay

## 2018-01-29 ENCOUNTER — Other Ambulatory Visit: Payer: Self-pay | Admitting: Hematology and Oncology

## 2018-01-29 ENCOUNTER — Telehealth: Payer: Self-pay

## 2018-01-29 DIAGNOSIS — C50512 Malignant neoplasm of lower-outer quadrant of left female breast: Secondary | ICD-10-CM

## 2018-01-29 DIAGNOSIS — C7800 Secondary malignant neoplasm of unspecified lung: Secondary | ICD-10-CM

## 2018-01-29 DIAGNOSIS — Z170 Estrogen receptor positive status [ER+]: Secondary | ICD-10-CM

## 2018-01-29 DIAGNOSIS — C778 Secondary and unspecified malignant neoplasm of lymph nodes of multiple regions: Secondary | ICD-10-CM | POA: Diagnosis not present

## 2018-01-29 DIAGNOSIS — Z9181 History of falling: Secondary | ICD-10-CM | POA: Diagnosis not present

## 2018-01-29 DIAGNOSIS — Z5111 Encounter for antineoplastic chemotherapy: Secondary | ICD-10-CM | POA: Diagnosis not present

## 2018-01-29 DIAGNOSIS — C787 Secondary malignant neoplasm of liver and intrahepatic bile duct: Secondary | ICD-10-CM | POA: Diagnosis not present

## 2018-01-29 LAB — CMP (CANCER CENTER ONLY)
ALT: 14 U/L (ref 0–55)
AST: 64 U/L — ABNORMAL HIGH (ref 5–34)
Albumin: 2.8 g/dL — ABNORMAL LOW (ref 3.5–5.0)
Alkaline Phosphatase: 107 U/L (ref 40–150)
Anion gap: 6 (ref 3–11)
BUN: 12 mg/dL (ref 7–26)
CO2: 25 mmol/L (ref 22–29)
CREATININE: 0.73 mg/dL (ref 0.60–1.10)
Calcium: 11.2 mg/dL — ABNORMAL HIGH (ref 8.4–10.4)
Chloride: 104 mmol/L (ref 98–109)
Glucose, Bld: 94 mg/dL (ref 70–140)
POTASSIUM: 2.9 mmol/L — AB (ref 3.5–5.1)
Sodium: 135 mmol/L — ABNORMAL LOW (ref 136–145)
TOTAL PROTEIN: 5.9 g/dL — AB (ref 6.4–8.3)
Total Bilirubin: 0.4 mg/dL (ref 0.2–1.2)

## 2018-01-29 LAB — CBC WITH DIFFERENTIAL (CANCER CENTER ONLY)
BASOS ABS: 0.1 10*3/uL (ref 0.0–0.1)
Basophils Relative: 1 %
EOS ABS: 0 10*3/uL (ref 0.0–0.5)
Eosinophils Relative: 0 %
HCT: 37.5 % (ref 34.8–46.6)
HEMOGLOBIN: 12.2 g/dL (ref 11.6–15.9)
LYMPHS ABS: 0.8 10*3/uL — AB (ref 0.9–3.3)
Lymphocytes Relative: 8 %
MCH: 29.6 pg (ref 25.1–34.0)
MCHC: 32.5 g/dL (ref 31.5–36.0)
MCV: 91.2 fL (ref 79.5–101.0)
Monocytes Absolute: 0.8 10*3/uL (ref 0.1–0.9)
Monocytes Relative: 8 %
NEUTROS PCT: 83 %
Neutro Abs: 8.5 10*3/uL — ABNORMAL HIGH (ref 1.5–6.5)
Platelet Count: 150 10*3/uL (ref 145–400)
RBC: 4.11 MIL/uL (ref 3.70–5.45)
RDW: 17.1 % — ABNORMAL HIGH (ref 11.2–14.5)
WBC: 10.2 10*3/uL (ref 3.9–10.3)

## 2018-01-29 MED ORDER — POTASSIUM CHLORIDE 10 MEQ/100ML IV SOLN
10.0000 meq | INTRAVENOUS | Status: DC
  Filled 2018-01-29: qty 100

## 2018-01-29 MED ORDER — HEPARIN SOD (PORK) LOCK FLUSH 100 UNIT/ML IV SOLN
500.0000 [IU] | Freq: Once | INTRAVENOUS | Status: AC | PRN
  Administered 2018-01-29: 500 [IU]
  Filled 2018-01-29: qty 5

## 2018-01-29 MED ORDER — FUROSEMIDE 10 MG/ML IJ SOLN: 10.0000 mg | Freq: Once | INTRAMUSCULAR | Status: DC

## 2018-01-29 MED ORDER — SODIUM CHLORIDE 0.9% FLUSH
10.0000 mL | Freq: Once | INTRAVENOUS | Status: AC
  Administered 2018-01-29: 10 mL via INTRAVENOUS
  Filled 2018-01-29: qty 10

## 2018-01-29 MED ORDER — POTASSIUM CHLORIDE 10 MEQ/100ML IV SOLN
10.0000 meq | INTRAVENOUS | Status: AC
  Administered 2018-01-29 (×2): 10 meq via INTRAVENOUS
  Filled 2018-01-29: qty 100

## 2018-01-29 MED ORDER — ZOLEDRONIC ACID 4 MG/100ML IV SOLN
4.0000 mg | Freq: Once | INTRAVENOUS | Status: AC
  Administered 2018-01-29: 4 mg via INTRAVENOUS
  Filled 2018-01-29: qty 100

## 2018-01-29 MED ORDER — SODIUM CHLORIDE 0.9% FLUSH
3.0000 mL | Freq: Once | INTRAVENOUS | Status: DC | PRN
  Filled 2018-01-29: qty 10

## 2018-01-29 MED ORDER — POTASSIUM CHLORIDE CRYS ER 20 MEQ PO TBCR: 20.0000 meq | EXTENDED_RELEASE_TABLET | Freq: Every day | ORAL | 1 refills | Status: DC

## 2018-01-29 MED ORDER — SODIUM CHLORIDE 0.9% FLUSH
10.0000 mL | Freq: Once | INTRAVENOUS | Status: AC | PRN
  Administered 2018-01-29: 10 mL
  Filled 2018-01-29: qty 10

## 2018-01-29 NOTE — Progress Notes (Signed)
Received call from patient's son to schedule an appointment regarding financial assistance for help with bills.  Advised him I can gather information and bring out to him due to me having an appointment coming in. Gave him Medicaid applications, financial assistance and [place] applications for patients whom live in [place]. Explained each application and what needed to be submitted with as well as where to submit them. Advised him that I will also meet with his mom after her next visit to over any other available opportunities and advised to have her bring proof of her income at that appointment.  Gave him my card for any other additional financial questions or concerns.

## 2018-01-29 NOTE — Progress Notes (Signed)
Dr. Lindi Adie notified via [facility] of patient's current serum potassium and serum calcium. Per Tiffany, Dr. Lindi Adie advised to hold chemo treatment for today and give KCl and Zometa. Patient notified of plan of care and agrees.

## 2018-01-29 NOTE — Progress Notes (Signed)
Patient is still not feeling well. We will hold off on doing chemotherapy today.  For the hypercalcemia will give her Zometa. For the hypokalemia we will give her 20 mEq of IV KCl. She will also take oral potassium daily.

## 2018-01-29 NOTE — Telephone Encounter (Signed)
Due to patients labs today K 2.9, calcium 11.2 patient will not receive [place] today per Dr. Lindi Adie. She will receive 20 of potassium IV and Zometa. Tim, Infusion RN aware. I sent in a prescription for PO potatssium as well. Tim, RN will make patient aware. We will reschedule patient for chemotherapy infusion next week.  Cyndia Bent RN

## 2018-01-30 ENCOUNTER — Telehealth: Payer: Self-pay | Admitting: Hematology and Oncology

## 2018-01-30 ENCOUNTER — Telehealth: Payer: Self-pay | Admitting: *Deleted

## 2018-01-30 NOTE — Telephone Encounter (Signed)
This RN received call from lab stating stool sample received yesterday on pt for C Diff and culture not done due to - C diff did not meet criteria - " too formed " and culture was sent in wrong container.  This RN attempted to reach pt - and obtained identified VM.  Message left requesting a return call for update on her status to this RN.

## 2018-01-30 NOTE — Telephone Encounter (Signed)
Spoke to patient regarding upcoming April appointments per 4/17 sch message.

## 2018-01-31 ENCOUNTER — Telehealth: Payer: Self-pay

## 2018-01-31 NOTE — Telephone Encounter (Signed)
Received message regarding unable to rub c-diff panel due to insufficient stool.   Called patient to f/u on symptoms.   Patient stated she is feeling better and no longer having diarrhea.   We will not need to recollect a stool at this time.   Cyndia Bent RN

## 2018-02-06 ENCOUNTER — Other Ambulatory Visit: Payer: Self-pay

## 2018-02-06 ENCOUNTER — Inpatient Hospital Stay (HOSPITAL_BASED_OUTPATIENT_CLINIC_OR_DEPARTMENT_OTHER): Payer: Medicare Other | Admitting: Adult Health

## 2018-02-06 ENCOUNTER — Encounter: Payer: Self-pay | Admitting: Hematology and Oncology

## 2018-02-06 ENCOUNTER — Inpatient Hospital Stay: Payer: Medicare Other

## 2018-02-06 VITALS — BP 97/46 | HR 74 | Temp 97.9°F | Resp 18

## 2018-02-06 DIAGNOSIS — C787 Secondary malignant neoplasm of liver and intrahepatic bile duct: Secondary | ICD-10-CM

## 2018-02-06 DIAGNOSIS — C50512 Malignant neoplasm of lower-outer quadrant of left female breast: Secondary | ICD-10-CM

## 2018-02-06 DIAGNOSIS — Z5111 Encounter for antineoplastic chemotherapy: Secondary | ICD-10-CM | POA: Diagnosis not present

## 2018-02-06 DIAGNOSIS — Z171 Estrogen receptor negative status [ER-]: Secondary | ICD-10-CM | POA: Diagnosis not present

## 2018-02-06 DIAGNOSIS — C778 Secondary and unspecified malignant neoplasm of lymph nodes of multiple regions: Secondary | ICD-10-CM | POA: Diagnosis not present

## 2018-02-06 DIAGNOSIS — Z170 Estrogen receptor positive status [ER+]: Secondary | ICD-10-CM

## 2018-02-06 DIAGNOSIS — E876 Hypokalemia: Secondary | ICD-10-CM | POA: Insufficient documentation

## 2018-02-06 DIAGNOSIS — Z9181 History of falling: Secondary | ICD-10-CM | POA: Diagnosis not present

## 2018-02-06 DIAGNOSIS — C7800 Secondary malignant neoplasm of unspecified lung: Secondary | ICD-10-CM

## 2018-02-06 LAB — CBC WITH DIFFERENTIAL (CANCER CENTER ONLY)
Basophils Absolute: 0 10*3/uL (ref 0.0–0.1)
Basophils Relative: 0 %
Eosinophils Absolute: 0.1 10*3/uL (ref 0.0–0.5)
Eosinophils Relative: 1 %
HEMATOCRIT: 40.3 % (ref 34.8–46.6)
HEMOGLOBIN: 13.3 g/dL (ref 11.6–15.9)
LYMPHS ABS: 1 10*3/uL (ref 0.9–3.3)
LYMPHS PCT: 11 %
MCH: 30 pg (ref 25.1–34.0)
MCHC: 33 g/dL (ref 31.5–36.0)
MCV: 90.8 fL (ref 79.5–101.0)
Monocytes Absolute: 0.9 10*3/uL (ref 0.1–0.9)
Monocytes Relative: 10 %
NEUTROS PCT: 78 %
Neutro Abs: 7.6 10*3/uL — ABNORMAL HIGH (ref 1.5–6.5)
Platelet Count: 245 10*3/uL (ref 145–400)
RBC: 4.44 MIL/uL (ref 3.70–5.45)
RDW: 16.4 % — ABNORMAL HIGH (ref 11.2–14.5)
WBC: 9.7 10*3/uL (ref 3.9–10.3)

## 2018-02-06 LAB — CMP (CANCER CENTER ONLY)
ALT: 14 U/L (ref 0–55)
AST: 67 U/L — AB (ref 5–34)
Albumin: 3.1 g/dL — ABNORMAL LOW (ref 3.5–5.0)
Alkaline Phosphatase: 124 U/L (ref 40–150)
Anion gap: 11 (ref 3–11)
BUN: 14 mg/dL (ref 7–26)
CHLORIDE: 101 mmol/L (ref 98–109)
CO2: 21 mmol/L — ABNORMAL LOW (ref 22–29)
Calcium: 12 mg/dL — ABNORMAL HIGH (ref 8.4–10.4)
Creatinine: 1.09 mg/dL (ref 0.60–1.10)
GFR, EST AFRICAN AMERICAN: 57 mL/min — AB (ref >60)
GFR, EST NON AFRICAN AMERICAN: 49 mL/min — AB (ref >60)
Glucose, Bld: 105 mg/dL (ref 70–140)
POTASSIUM: 2.5 mmol/L — AB (ref 3.5–5.1)
Sodium: 133 mmol/L — ABNORMAL LOW (ref 136–145)
Total Bilirubin: 0.6 mg/dL (ref 0.2–1.2)
Total Protein: 6.4 g/dL (ref 6.4–8.3)

## 2018-02-06 MED ORDER — PALONOSETRON HCL INJECTION 0.25 MG/5ML
0.2500 mg | Freq: Once | INTRAVENOUS | Status: AC
Start: 2018-02-06 — End: 2018-02-06
  Administered 2018-02-06: 0.25 mg via INTRAVENOUS

## 2018-02-06 MED ORDER — SODIUM CHLORIDE 0.9% FLUSH
10.0000 mL | INTRAVENOUS | Status: DC | PRN
  Administered 2018-02-06: 10 mL
  Filled 2018-02-06: qty 10

## 2018-02-06 MED ORDER — HEPARIN SOD (PORK) LOCK FLUSH 100 UNIT/ML IV SOLN
500.0000 [IU] | Freq: Once | INTRAVENOUS | Status: AC | PRN
  Administered 2018-02-06: 500 [IU]
  Filled 2018-02-06: qty 5

## 2018-02-06 MED ORDER — POTASSIUM CHLORIDE CRYS ER 20 MEQ PO TBCR: 40.0000 meq | EXTENDED_RELEASE_TABLET | Freq: Every day | ORAL | 1 refills | Status: DC

## 2018-02-06 MED ORDER — ZOLEDRONIC ACID 4 MG/100ML IV SOLN
4.0000 mg | Freq: Once | INTRAVENOUS | Status: AC
  Administered 2018-02-06: 4 mg via INTRAVENOUS
  Filled 2018-02-06: qty 100

## 2018-02-06 MED ORDER — POTASSIUM CHLORIDE CRYS ER 20 MEQ PO TBCR: 40.0000 meq | EXTENDED_RELEASE_TABLET | Freq: Two times a day (BID) | ORAL | 1 refills | Status: AC

## 2018-02-06 MED ORDER — SODIUM CHLORIDE 0.9 % IV SOLN
Freq: Once | INTRAVENOUS | Status: AC
  Administered 2018-02-06: 17:00:00 via INTRAVENOUS

## 2018-02-06 MED ORDER — ERIBULIN MESYLATE CHEMO INJECTION 1 MG/2ML
1.3700 mg/m2 | Freq: Once | INTRAVENOUS | Status: AC
  Administered 2018-02-06: 2.5 mg via INTRAVENOUS
  Filled 2018-02-06: qty 5

## 2018-02-06 MED ORDER — POTASSIUM CHLORIDE 10 MEQ/100ML IV SOLN
10.0000 meq | INTRAVENOUS | Status: AC
  Administered 2018-02-06 (×2): 10 meq via INTRAVENOUS
  Filled 2018-02-06: qty 100

## 2018-02-06 NOTE — Progress Notes (Signed)
Christina Horn Cancer Follow up:    Christina Horn, [place] [address]   DIAGNOSIS: Cancer Staging No matching staging information was found for the patient.  SUMMARY OF ONCOLOGIC HISTORY:   Breast cancer of lower-outer quadrant of left female breast ([place])   06/07/2011 Surgery     left mastectomy : multiple foci of invasive ductal carcinoma grade 2 5.1 cm and 1 cm with high-grade DCIS, lymphovascular invasion , 1/7 [place] lymph node ER 100%, PR and her percent, Ki-67 50%, HER-2 equivocal ratio 2 , Oncotype 28      06/20/2011 - 09/11/2011 Chemotherapy    TC X 4      10/08/2011 - 11/13/2011 Radiation Therapy    chest wall radiation therapy       12/25/2011 -  Anti-estrogen oral therapy    Arimidex 1 mg daily       11/12/2017 Relapse/Recurrence    CT scan demonstrates lesion in liver, lung, and retroperitoneal lymph node.  Biopsy of the retroperitoneal lymph node metastatic poorly differentiated carcinoma ER 0%, PR 0%, HER-2 negative ratio 1.23      12/11/2017 -  Chemotherapy    Eribulin given on day 1 and day 8 of a 21 day cycle.       01/17/2018 - 01/20/2018 Hospital Admission    Hypercalcemia causing severe weakness and syncope       CURRENT THERAPY: Eribulin  INTERVAL HISTORY: Christina Horn 72 y.o. female returns for Eribulin.  I was called to evaluate her in the infusion center because her potassium was 2.5.  She tells me that she is feeling well.  She denies diarrhea.  She has a potassium supplement.  She says she is taking it.  Her son, who usually drops her off at her appointments is not with her today.  [name] is doing well otherwise today.     Patient Active Problem List   Diagnosis Date Noted  . Hypokalemia 02/06/2018  . Hypercalcemia 11/20/2017  . Liver metastasis ([place]) 11/14/2017  . Lung metastases ([place]) 11/14/2017  . Depression with anxiety 08/18/2016  . Dyspnea 08/18/2016  . Non-insulin dependent type 2 diabetes  mellitus ([place]) 08/18/2016  . Hypertension 08/18/2016  . OSA on CPAP 08/18/2016  . Kidney disease 08/18/2016  . SOB (shortness of breath) 08/18/2016  . COPD (chronic obstructive pulmonary disease) ([place]) 08/18/2016  . Elevated troponin 08/18/2016  . Osteopenia 10/27/2015  . Breast cancer of lower-outer quadrant of left female breast ([place]) 05/09/2011    has No Known Allergies.  MEDICAL HISTORY: Past Medical History:  Diagnosis Date  . Anxiety    h/o anxiety attack  . Arthritis    back meds not required  . Breast cancer ([place])    left breast  . Breast cancer, left breast ([place]) 05/09/2011  . COPD (chronic obstructive pulmonary disease) ([place])   . Diabetes mellitus    borderline  . Hypertension   . Pneumonia   . Sleep apnea    Uses CPAP    SURGICAL HISTORY: Past Surgical History:  Procedure Laterality Date  . ABDOMINAL HYSTERECTOMY    . BREAST SURGERY    . CHOLECYSTECTOMY    . IR FLUORO GUIDE PORT INSERTION RIGHT  12/10/2017  . IR US GUIDE VASC ACCESS RIGHT  12/10/2017  . left leg vein striping    . MASTECTOMY  06/07/11   left   . TRACHEOSTOMY    . VASCULAR SURGERY      SOCIAL HISTORY:  Social History   Socioeconomic History  . Marital status: Widowed    Spouse name: Not on file  . Number of children: Not on file  . Years of education: Not on file  . Highest education level: Not on file  Occupational History  . Not on file  Social Needs  . Financial resource strain: Not on file  . Food insecurity:    Worry: Not on file    Inability: Not on file  . Transportation needs:    Medical: Not on file    Non-medical: Not on file  Tobacco Use  . Smoking status: Former Smoker    Packs/day: 1.50    Years: 25.00    Pack years: 37.50    Types: Cigarettes    Last attempt to quit: 10/19/2001    Years since quitting: 16.3  . Smokeless tobacco: Never Used  Substance and Sexual Activity  . Alcohol use: No  . Drug use: No  . Sexual activity: Not Currently  Lifestyle  .  Physical activity:    Days per week: Not on file    Minutes per session: Not on file  . Stress: Not on file  Relationships  . Social connections:    Talks on phone: Not on file    Gets together: Not on file    Attends religious service: Not on file    Active member of club or organization: Not on file    Attends meetings of clubs or organizations: Not on file    Relationship status: Not on file  . Intimate partner violence:    Fear of current or ex partner: Not on file    Emotionally abused: Not on file    Physically abused: Not on file    Forced sexual activity: Not on file  Other Topics Concern  . Not on file  Social History Narrative  . Not on file    FAMILY HISTORY: Family History  Problem Relation Age of Onset  . Cancer Mother        breast  . Cancer Sister        lung  . Cancer Maternal Aunt        lung  . Cancer Maternal Grandmother        unknown  . Hypertension Sister     Review of Systems  Constitutional: Positive for fatigue. Negative for appetite change, chills, fever and unexpected weight change.  HENT:   Negative for hearing loss, lump/mass and trouble swallowing.   Eyes: Negative for eye problems and icterus.  Respiratory: Negative for chest tightness, cough and shortness of breath.   Cardiovascular: Negative for chest pain, leg swelling and palpitations.  Gastrointestinal: Negative for abdominal distention, abdominal pain, constipation, diarrhea, nausea and vomiting.  Musculoskeletal: Positive for arthralgias.  Neurological: Negative for dizziness and numbness.  Hematological: Negative for adenopathy. Does not bruise/bleed easily.  Psychiatric/Behavioral: Negative for depression. The patient is not nervous/anxious.       PHYSICAL EXAMINATION  ECOG PERFORMANCE STATUS: 3 See CH[facility] for VS  Physical Exam  Constitutional: She is oriented to person, place, and time.  Chronically ill appearing  HENT:  Head: Normocephalic and atraumatic.   Mouth/Throat: Oropharynx is clear and moist. No oropharyngeal exudate.  Eyes: Pupils are equal, round, and reactive to light. No scleral icterus.  Neck: Neck supple.  Cardiovascular: Normal rate, regular rhythm and normal heart sounds.  Pulmonary/Chest: Effort normal and breath sounds normal.  Abdominal: Soft. Bowel sounds are normal. She exhibits distension. There is  no tenderness.  Lymphadenopathy:    She has no cervical adenopathy.  Neurological: She is alert and oriented to person, place, and time.  Skin: Skin is warm and dry. No erythema.  Psychiatric: Mood and affect normal.    LABORATORY DATA:  CBC    Component Value Date/Time   WBC 9.7 02/06/2018 1400   WBC 3.8 (L) 01/20/2018 0430   RBC 4.44 02/06/2018 1400   HGB 13.3 02/06/2018 1400   HGB 12.2 10/25/2016 1211   HCT 40.3 02/06/2018 1400   HCT 37.2 10/25/2016 1211   PLT 245 02/06/2018 1400   PLT 274 10/25/2016 1211   MCV 90.8 02/06/2018 1400   MCV 94.7 10/25/2016 1211   MCH 30.0 02/06/2018 1400   MCHC 33.0 02/06/2018 1400   RDW 16.4 (H) 02/06/2018 1400   RDW 13.3 10/25/2016 1211   LYMPHSABS 1.0 02/06/2018 1400   LYMPHSABS 1.7 10/25/2016 1211   MONOABS 0.9 02/06/2018 1400   MONOABS 0.6 10/25/2016 1211   EOSABS 0.1 02/06/2018 1400   EOSABS 0.2 10/25/2016 1211   BASOSABS 0.0 02/06/2018 1400   BASOSABS 0.1 10/25/2016 1211    CMP     Component Value Date/Time   NA 133 (L) 02/06/2018 1400   NA 143 10/25/2016 1211   K 2.5 (LL) 02/06/2018 1400   K 4.7 10/25/2016 1211   CL 101 02/06/2018 1400   CL 100 07/30/2012 0939   CO2 21 (L) 02/06/2018 1400   CO2 28 10/25/2016 1211   GLUCOSE 105 02/06/2018 1400   GLUCOSE 108 10/25/2016 1211   GLUCOSE 167 (H) 07/30/2012 0939   BUN 14 02/06/2018 1400   BUN 13.4 10/25/2016 1211   CREATININE 1.09 02/06/2018 1400   CREATININE 0.8 10/25/2016 1211   CALCIUM 12.0 (H) 02/06/2018 1400   CALCIUM 9.4 10/25/2016 1211   PROT 6.4 02/06/2018 1400   PROT 6.4 10/25/2016 1211    ALBUMIN 3.1 (L) 02/06/2018 1400   ALBUMIN 3.4 (L) 10/25/2016 1211   AST 67 (H) 02/06/2018 1400   AST 18 10/25/2016 1211   ALT 14 02/06/2018 1400   ALT 21 10/25/2016 1211   ALKPHOS 124 02/06/2018 1400   ALKPHOS 55 10/25/2016 1211   BILITOT 0.6 02/06/2018 1400   BILITOT 0.41 10/25/2016 1211   GFRNONAA 49 (L) 02/06/2018 1400   GFRAA 57 (L) 02/06/2018 1400      ASSESSMENT and PLAN:   Breast cancer of lower-outer quadrant of left female breast ([place]) Left breast invasive ductal carcinoma T3 N1 M0 status post mastectomy, Oncotype DX score 28 status post 4 cycles of Taxotere and Cytoxan followed by chest wall radiation and Arimidex 1 mg daily since March 2013  New lesions on CT A/P show liver, lower lung, and retroperitoneal lesions Liver biopsy: Poorly differentiated carcinoma ER PR negative  PET/CT scan: Metastatic disease involving bilateral lungs, hilar and mediastinal nodes, liver metastases extremely large size, retroperitoneal nodes  She was started on Eribulin.  Shantrice is doing moderately well.  She has been receiving Zometa almost weekly for her hypercalcemia.  She is tolerating this well.  Her potassium is 2.5 today.  I reviewed this with Dr. Jana Hakim.  She will receive 97mq KCl IV today, and take her potassium pills.  She will proceed with Eribulin today.  She will return on Monday for a potassium check.  I did order a home health eval to evaluate CBirgitin the home and hopefully check on how she is taking her medications (especially her potassium).  We will increase her  potassium to 81mq po bid.        All questions were answered. The patient knows to call the clinic with any problems, questions or concerns. We can certainly see the patient much sooner if necessary.  A total of (30) minutes of face-to-face time was spent with this patient with greater than 50% of that time in counseling and care-coordination.  This note was electronically signed. LScot Dock  NP 02/07/2018

## 2018-02-06 NOTE — Patient Instructions (Addendum)
[facility] Discharge Instructions for Patients Receiving Chemotherapy  Today you received the following chemotherapy agents [place] and electrolyte potassium.   To help prevent nausea and vomiting after your treatment, we encourage you to take your nausea medication as prescribed.    If you develop nausea and vomiting that is not controlled by your nausea medication, call the clinic.   BELOW ARE SYMPTOMS THAT SHOULD BE REPORTED IMMEDIATELY:  *FEVER GREATER THAN 100.5 F  *CHILLS WITH OR WITHOUT FEVER  NAUSEA AND VOMITING THAT IS NOT CONTROLLED WITH YOUR NAUSEA MEDICATION  *UNUSUAL SHORTNESS OF BREATH  *UNUSUAL BRUISING OR BLEEDING  TENDERNESS IN MOUTH AND THROAT WITH OR WITHOUT PRESENCE OF ULCERS  *URINARY PROBLEMS  *BOWEL PROBLEMS  UNUSUAL RASH Items with * indicate a potential emergency and should be followed up as soon as possible.  Feel free to call the clinic should you have any questions or concerns. The clinic phone number is [phone].  Please show the [facility] at check-in to the Emergency Department and triage nurse.    Hypokalemia Hypokalemia means that the amount of potassium in the blood is lower than normal.Potassium is a chemical that helps regulate the amount of fluid in the body (electrolyte). It also stimulates muscle tightening (contraction) and helps nerves work properly.Normally, most of the body's potassium is inside of cells, and only a very small amount is in the blood. Because the amount in the blood is so small, minor changes to potassium levels in the blood can be life-threatening. What are the causes? This condition may be caused by:  Antibiotic medicine.  Diarrhea or vomiting. Taking too much of a medicine that helps you have a bowel movement (laxative) can cause diarrhea and lead to hypokalemia.  Chronic kidney disease (CKD).  Medicines that help the body get rid of excess fluid (diuretics).  Eating disorders,  such as bulimia.  Low magnesium levels in the body.  Sweating a lot.  What are the signs or symptoms? Symptoms of this condition include:  Weakness.  Constipation.  Fatigue.  Muscle cramps.  Mental confusion.  Skipped heartbeats or irregular heartbeat (palpitations).  Tingling or numbness.  How is this diagnosed? This condition is diagnosed with a blood test. How is this treated? Hypokalemia can be treated by taking potassium supplements by mouth or adjusting the medicines that you take. Treatment may also include eating more foods that contain a lot of potassium. If your potassium level is very low, you may need to get potassium through an IV tube in one of your veins and be monitored in the hospital. Follow these instructions at home:  Take over-the-counter and prescription medicines only as told by your health care provider. This includes vitamins and supplements.  Eat a healthy diet. A healthy diet includes fresh fruits and vegetables, whole grains, healthy fats, and lean proteins.  If instructed, eat more foods that contain a lot of potassium, such as: ? Nuts, such as peanuts and pistachios. ? Seeds, such as sunflower seeds and pumpkin seeds. ? Peas, lentils, and lima beans. ? Whole grain and bran cereals and breads. ? Fresh fruits and vegetables, such as apricots, avocado, bananas, cantaloupe, kiwi, oranges, tomatoes, asparagus, and potatoes. ? Orange juice. ? Tomato juice. ? Red meats. ? Yogurt.  Keep all follow-up visits as told by your health care provider. This is important. Contact a health care provider if:  You have weakness that gets worse.  You feel your heart pounding or racing.  You  vomit.  You have diarrhea.  You have diabetes (diabetes mellitus) and you have trouble keeping your blood sugar (glucose) in your target range. Get help right away if:  You have chest pain.  You have shortness of breath.  You have vomiting or diarrhea that  lasts for more than 2 days.  You faint. This information is not intended to replace advice given to you by your health care provider. Make sure you discuss any questions you have with your health care provider. Document Released: 10/01/2005 Document Revised: 05/19/2016 Document Reviewed: 05/19/2016 Elsevier Interactive Patient Education  2018 [employer].      Potassium Content of Foods Potassium is a mineral found in many foods and drinks. It helps keep fluids and minerals balanced in your body and affects how steadily your heart beats. Potassium also helps control your blood pressure and keep your muscles and nervous system healthy. Certain health conditions and medicines may change the balance of potassium in your body. When this happens, you can help balance your level of potassium through the foods that you do or do not eat. Your health care provider or dietitian may recommend an amount of potassium that you should have each day. The following lists of foods provide the amount of potassium (in parentheses) per serving in each item. High in potassium The following foods and beverages have 200 mg or more of potassium per serving: Apricots, 2 raw or 5 dry (200 mg). Artichoke, 1 medium (345 mg). Avocado, raw,  each (245 mg). Banana, 1 medium (425 mg). Beans, lima, or baked beans, canned,  cup (280 mg). Beans, white, canned,  cup (595 mg). Beef roast, 3 oz (320 mg). Beef, ground, 3 oz (270 mg). Beets, raw or cooked,  cup (260 mg). Bran muffin, 2 oz (300 mg). Broccoli,  cup (230 mg). Brussels sprouts,  cup (250 mg). Cantaloupe,  cup (215 mg). Cereal, 100% bran,  cup (200-400 mg). Cheeseburger, single, fast food, 1 each (225-400 mg). Chicken, 3 oz (220 mg). Clams, canned, 3 oz (535 mg). Crab, 3 oz (225 mg). Dates, 5 each (270 mg). Dried beans and peas,  cup (300-475 mg). Figs, dried, 2 each (260 mg). Fish: halibut, tuna, cod, snapper, 3 oz (480 mg). Fish: salmon,  haddock, swordfish, perch, 3 oz (300 mg). Fish, tuna, canned 3 oz (200 mg). [place] fries, fast food, 3 oz (470 mg). Granola with fruit and nuts,  cup (200 mg). Grapefruit juice,  cup (200 mg). Greens, beet,  cup (655 mg). Honeydew melon,  cup (200 mg). Kale, raw, 1 cup (300 mg). Kiwi, 1 medium (240 mg). Kohlrabi, rutabaga, parsnips,  cup (280 mg). Lentils,  cup (365 mg). Mango, 1 each (325 mg). Milk, chocolate, 1 cup (420 mg). Milk: nonfat, low-fat, whole, buttermilk, 1 cup (350-380 mg). Molasses, 1 Tbsp (295 mg). Mushrooms,  cup (280) mg. Nectarine, 1 each (275 mg). Nuts: almonds, peanuts, hazelnuts, [place], cashew, mixed, 1 oz (200 mg). Nuts, pistachios, 1 oz (295 mg). Orange, 1 each (240 mg). Orange juice,  cup (235 mg). Papaya, medium,  fruit (390 mg). Peanut butter, chunky, 2 Tbsp (240 mg). Peanut butter, smooth, 2 Tbsp (210 mg). Pear, 1 medium (200 mg). Pomegranate, 1 whole (400 mg). Pomegranate juice,  cup (215 mg). Pork, 3 oz (350 mg). Potato chips, salted, 1 oz (465 mg). Potato, baked with skin, 1 medium (925 mg). Potatoes, boiled,  cup (255 mg). Potatoes, mashed,  cup (330 mg). Prune juice,  cup (370 mg). Prunes, 5 each (305 mg).  Pudding, chocolate,  cup (230 mg). Pumpkin, canned,  cup (250 mg). Raisins, seedless,  cup (270 mg). Seeds, sunflower or pumpkin, 1 oz (240 mg). Soy milk, 1 cup (300 mg). Spinach,  cup (420 mg). Spinach, canned,  cup (370 mg). Sweet potato, baked with skin, 1 medium (450 mg). Swiss chard,  cup (480 mg). Tomato or vegetable juice,  cup (275 mg). Tomato sauce or puree,  cup (400-550 mg). Tomato, raw, 1 medium (290 mg). Tomatoes, canned,  cup (200-300 mg). [place], 3 oz (250 mg). Wheat germ, 1 oz (250 mg). Winter squash,  cup (250 mg). Yogurt, plain or fruited, 6 oz (260-435 mg). Zucchini,  cup (220 mg).  Moderate in potassium The following foods and beverages have 50-200 mg of potassium per serving: Apple,  1 each (150 mg). Apple juice,  cup (150 mg). Applesauce,  cup (90 mg). Apricot nectar,  cup (140 mg). Asparagus, small spears,  cup or 6 spears (155 mg). Bagel, cinnamon raisin, 1 each (130 mg). Bagel, egg or plain, 4 in., 1 each (70 mg). Beans, green,  cup (90 mg). Beans, yellow,  cup (190 mg). Beer, regular, 12 oz (100 mg). Beets, canned,  cup (125 mg). Blackberries,  cup (115 mg). Blueberries,  cup (60 mg). Bread, whole wheat, 1 slice (70 mg). Broccoli, raw,  cup (145 mg). Cabbage,  cup (150 mg). Carrots, cooked or raw,  cup (180 mg). Cauliflower, raw,  cup (150 mg). Celery, raw,  cup (155 mg). Cereal, bran flakes, cup (120-150 mg). Cheese, cottage,  cup (110 mg). Cherries, 10 each (150 mg). Chocolate, 1 oz bar (165 mg). Coffee, brewed 6 oz (90 mg). Corn,  cup or 1 ear (195 mg). Cucumbers,  cup (80 mg). Egg, large, 1 each (60 mg). Eggplant,  cup (60 mg). Endive, raw, cup (80 mg). English muffin, 1 each (65 mg). Fish, orange roughy, 3 oz (150 mg). Frankfurter, beef or pork, 1 each (75 mg). Fruit cocktail,  cup (115 mg). Grape juice,  cup (170 mg). Grapefruit,  fruit (175 mg). Grapes,  cup (155 mg). Greens: kale, turnip, collard,  cup (110-150 mg). Ice cream or frozen yogurt, chocolate,  cup (175 mg). Ice cream or frozen yogurt, vanilla,  cup (120-150 mg). Lemons, limes, 1 each (80 mg). Lettuce, all types, 1 cup (100 mg). Mixed vegetables,  cup (150 mg). Mushrooms, raw,  cup (110 mg). Nuts: walnuts, pecans, or macadamia, 1 oz (125 mg). Oatmeal,  cup (80 mg). Okra,  cup (110 mg). Onions, raw,  cup (120 mg). Peach, 1 each (185 mg). Peaches, canned,  cup (120 mg). Pears, canned,  cup (120 mg). Peas, green, frozen,  cup (90 mg). Peppers, green,  cup (130 mg). Peppers, red,  cup (160 mg). Pineapple juice,  cup (165 mg). Pineapple, fresh or canned,  cup (100 mg). Plums, 1 each (105 mg). Pudding, vanilla,  cup (150  mg). Raspberries,  cup (90 mg). Rhubarb,  cup (115 mg). Rice, wild,  cup (80 mg). Shrimp, 3 oz (155 mg). Spinach, raw, 1 cup (170 mg). Strawberries,  cup (125 mg). Summer squash  cup (175-200 mg). Swiss chard, raw, 1 cup (135 mg). Tangerines, 1 each (140 mg). Tea, brewed, 6 oz (65 mg). Turnips,  cup (140 mg). Watermelon,  cup (85 mg). Wine, red, table, 5 oz (180 mg). Wine, white, table, 5 oz (100 mg).  Low in potassium The following foods and beverages have less than 50 mg of potassium per serving. Bread, white, 1 slice (30 mg). Carbonated  beverages, 12 oz (less than 5 mg). Cheese, 1 oz (20-30 mg). Cranberries,  cup (45 mg). Cranberry juice cocktail,  cup (20 mg). Fats and oils, 1 Tbsp (less than 5 mg). Hummus, 1 Tbsp (32 mg). Nectar: papaya, mango, or pear,  cup (35 mg). Rice, white or brown,  cup (50 mg). Spaghetti or macaroni,  cup cooked (30 mg). Tortilla, flour or corn, 1 each (50 mg). Waffle, 4 in., 1 each (50 mg). Water chestnuts,  cup (40 mg).  This information is not intended to replace advice given to you by your health care provider. Make sure you discuss any questions you have with your health care provider. Document Released: 05/15/2005 Document Revised: 03/08/2016 Document Reviewed: 08/28/2013 Elsevier Interactive Patient Education  [employer].

## 2018-02-06 NOTE — Progress Notes (Signed)
Per Lindsey,NP order home health for medication management. Called in referral to [facility] and will contact pt shortly.

## 2018-02-06 NOTE — Progress Notes (Signed)
Patient's son came to go over applications given to him and documents. Looked over applications. He will mail them off.   Being that he brought proof of income, was able to approve patient for one-time $400 [place]. She signed application. Gave copy of approval letter and expense sheet along with outpatient pharmacy information to patient's son. He verbalized understanding. Gas card was given today. He has my card for any additional financial questions or concerns.

## 2018-02-07 ENCOUNTER — Encounter: Payer: Self-pay | Admitting: Adult Health

## 2018-02-07 ENCOUNTER — Telehealth: Payer: Self-pay | Admitting: Hematology and Oncology

## 2018-02-07 ENCOUNTER — Telehealth: Payer: Self-pay | Admitting: Adult Health

## 2018-02-07 ENCOUNTER — Telehealth: Payer: Self-pay

## 2018-02-07 DIAGNOSIS — I129 Hypertensive chronic kidney disease with stage 1 through stage 4 chronic kidney disease, or unspecified chronic kidney disease: Secondary | ICD-10-CM | POA: Diagnosis not present

## 2018-02-07 DIAGNOSIS — C50512 Malignant neoplasm of lower-outer quadrant of left female breast: Secondary | ICD-10-CM | POA: Diagnosis not present

## 2018-02-07 DIAGNOSIS — Z9181 History of falling: Secondary | ICD-10-CM | POA: Diagnosis not present

## 2018-02-07 DIAGNOSIS — N182 Chronic kidney disease, stage 2 (mild): Secondary | ICD-10-CM | POA: Diagnosis not present

## 2018-02-07 DIAGNOSIS — G4733 Obstructive sleep apnea (adult) (pediatric): Secondary | ICD-10-CM | POA: Diagnosis not present

## 2018-02-07 DIAGNOSIS — C787 Secondary malignant neoplasm of liver and intrahepatic bile duct: Secondary | ICD-10-CM | POA: Diagnosis not present

## 2018-02-07 DIAGNOSIS — C7801 Secondary malignant neoplasm of right lung: Secondary | ICD-10-CM | POA: Diagnosis not present

## 2018-02-07 DIAGNOSIS — J449 Chronic obstructive pulmonary disease, unspecified: Secondary | ICD-10-CM | POA: Diagnosis not present

## 2018-02-07 DIAGNOSIS — Z170 Estrogen receptor positive status [ER+]: Secondary | ICD-10-CM | POA: Diagnosis not present

## 2018-02-07 DIAGNOSIS — I81 Portal vein thrombosis: Secondary | ICD-10-CM | POA: Diagnosis not present

## 2018-02-07 DIAGNOSIS — R7303 Prediabetes: Secondary | ICD-10-CM | POA: Diagnosis not present

## 2018-02-07 DIAGNOSIS — C7802 Secondary malignant neoplasm of left lung: Secondary | ICD-10-CM | POA: Diagnosis not present

## 2018-02-07 DIAGNOSIS — C772 Secondary and unspecified malignant neoplasm of intra-abdominal lymph nodes: Secondary | ICD-10-CM | POA: Diagnosis not present

## 2018-02-07 DIAGNOSIS — F419 Anxiety disorder, unspecified: Secondary | ICD-10-CM | POA: Diagnosis not present

## 2018-02-07 NOTE — Telephone Encounter (Signed)
Per 4/25 no los 

## 2018-02-07 NOTE — Telephone Encounter (Signed)
LVM for Maudie Mercury with [facility] to draw a CMET on patient on Monday to recheck potassium and calcium.  Cyndia Bent RN

## 2018-02-07 NOTE — Telephone Encounter (Signed)
Called patients son to schedule for Monday he said that the home health nurse would take care of it

## 2018-02-07 NOTE — Telephone Encounter (Signed)
Called patient to check in with her to see how she is feeling today.  Patient states she is feeling fine today.  LPN asked if she is taking her potassium as directed and she said yes.  LPN expressed to patient to call center with any issues or concerns that may arise.  Patient voiced understanding.

## 2018-02-07 NOTE — Assessment & Plan Note (Addendum)
Left breast invasive ductal carcinoma T3 N1 M0 status post mastectomy, Oncotype DX score 28 status post 4 cycles of Taxotere and Cytoxan followed by chest wall radiation and Arimidex 1 mg daily since March 2013  New lesions on CT A/P show liver, lower lung, and retroperitoneal lesions Liver biopsy: Poorly differentiated carcinoma ER PR negative  PET/CT scan: Metastatic disease involving bilateral lungs, hilar and mediastinal nodes, liver metastases extremely large size, retroperitoneal nodes  She was started on Eribulin.  Christina Horn is doing moderately well.  She has been receiving Zometa almost weekly for her hypercalcemia.  She is tolerating this well.  Her potassium is 2.5 today.  I reviewed this with Dr. Jana Hakim.  She will receive 44meq KCl IV today, and take her potassium pills.  She will proceed with Eribulin today.  She will return on Monday for a potassium check.  I did order a home health eval to evaluate [name] in the home and hopefully check on how she is taking her medications (especially her potassium).  We will increase her potassium to 35meq po bid.

## 2018-02-10 ENCOUNTER — Telehealth: Payer: Self-pay

## 2018-02-10 DIAGNOSIS — C787 Secondary malignant neoplasm of liver and intrahepatic bile duct: Secondary | ICD-10-CM | POA: Diagnosis not present

## 2018-02-10 DIAGNOSIS — C50512 Malignant neoplasm of lower-outer quadrant of left female breast: Secondary | ICD-10-CM | POA: Diagnosis not present

## 2018-02-10 DIAGNOSIS — C7802 Secondary malignant neoplasm of left lung: Secondary | ICD-10-CM | POA: Diagnosis not present

## 2018-02-10 DIAGNOSIS — C772 Secondary and unspecified malignant neoplasm of intra-abdominal lymph nodes: Secondary | ICD-10-CM | POA: Diagnosis not present

## 2018-02-10 DIAGNOSIS — C7801 Secondary malignant neoplasm of right lung: Secondary | ICD-10-CM | POA: Diagnosis not present

## 2018-02-10 DIAGNOSIS — C50511 Malignant neoplasm of lower-outer quadrant of right female breast: Secondary | ICD-10-CM | POA: Diagnosis not present

## 2018-02-10 NOTE — Telephone Encounter (Signed)
Called and spoke with [facility] and provided fax and direct phone number for them to reach [place] with any information including labs they are drawing on patient today.  Cyndia Bent RN

## 2018-02-13 DIAGNOSIS — C7802 Secondary malignant neoplasm of left lung: Secondary | ICD-10-CM | POA: Diagnosis not present

## 2018-02-13 DIAGNOSIS — C7801 Secondary malignant neoplasm of right lung: Secondary | ICD-10-CM | POA: Diagnosis not present

## 2018-02-13 DIAGNOSIS — C787 Secondary malignant neoplasm of liver and intrahepatic bile duct: Secondary | ICD-10-CM | POA: Diagnosis not present

## 2018-02-13 DIAGNOSIS — C50512 Malignant neoplasm of lower-outer quadrant of left female breast: Secondary | ICD-10-CM | POA: Diagnosis not present

## 2018-02-13 DIAGNOSIS — C772 Secondary and unspecified malignant neoplasm of intra-abdominal lymph nodes: Secondary | ICD-10-CM | POA: Diagnosis not present

## 2018-02-17 ENCOUNTER — Inpatient Hospital Stay: Payer: Medicare Other | Attending: Hematology and Oncology | Admitting: Hematology and Oncology

## 2018-02-17 DIAGNOSIS — C787 Secondary malignant neoplasm of liver and intrahepatic bile duct: Secondary | ICD-10-CM | POA: Diagnosis not present

## 2018-02-17 DIAGNOSIS — C7801 Secondary malignant neoplasm of right lung: Secondary | ICD-10-CM | POA: Diagnosis not present

## 2018-02-17 DIAGNOSIS — C50512 Malignant neoplasm of lower-outer quadrant of left female breast: Secondary | ICD-10-CM | POA: Diagnosis not present

## 2018-02-17 DIAGNOSIS — C7802 Secondary malignant neoplasm of left lung: Secondary | ICD-10-CM | POA: Diagnosis not present

## 2018-02-17 DIAGNOSIS — C772 Secondary and unspecified malignant neoplasm of intra-abdominal lymph nodes: Secondary | ICD-10-CM | POA: Diagnosis not present

## 2018-02-18 DIAGNOSIS — C787 Secondary malignant neoplasm of liver and intrahepatic bile duct: Secondary | ICD-10-CM | POA: Diagnosis not present

## 2018-02-18 DIAGNOSIS — C7802 Secondary malignant neoplasm of left lung: Secondary | ICD-10-CM | POA: Diagnosis not present

## 2018-02-18 DIAGNOSIS — C772 Secondary and unspecified malignant neoplasm of intra-abdominal lymph nodes: Secondary | ICD-10-CM | POA: Diagnosis not present

## 2018-02-18 DIAGNOSIS — C50512 Malignant neoplasm of lower-outer quadrant of left female breast: Secondary | ICD-10-CM | POA: Diagnosis not present

## 2018-02-18 DIAGNOSIS — C7801 Secondary malignant neoplasm of right lung: Secondary | ICD-10-CM | POA: Diagnosis not present

## 2018-02-20 DIAGNOSIS — C772 Secondary and unspecified malignant neoplasm of intra-abdominal lymph nodes: Secondary | ICD-10-CM | POA: Diagnosis not present

## 2018-02-20 DIAGNOSIS — C7801 Secondary malignant neoplasm of right lung: Secondary | ICD-10-CM | POA: Diagnosis not present

## 2018-02-20 DIAGNOSIS — C50512 Malignant neoplasm of lower-outer quadrant of left female breast: Secondary | ICD-10-CM | POA: Diagnosis not present

## 2018-02-20 DIAGNOSIS — C7802 Secondary malignant neoplasm of left lung: Secondary | ICD-10-CM | POA: Diagnosis not present

## 2018-02-20 DIAGNOSIS — C787 Secondary malignant neoplasm of liver and intrahepatic bile duct: Secondary | ICD-10-CM | POA: Diagnosis not present

## 2018-02-21 ENCOUNTER — Telehealth: Payer: Self-pay | Admitting: Hematology and Oncology

## 2018-02-21 DIAGNOSIS — I10 Essential (primary) hypertension: Secondary | ICD-10-CM | POA: Diagnosis not present

## 2018-02-21 DIAGNOSIS — E118 Type 2 diabetes mellitus with unspecified complications: Secondary | ICD-10-CM | POA: Diagnosis not present

## 2018-02-21 DIAGNOSIS — C787 Secondary malignant neoplasm of liver and intrahepatic bile duct: Secondary | ICD-10-CM | POA: Diagnosis not present

## 2018-02-21 DIAGNOSIS — J449 Chronic obstructive pulmonary disease, unspecified: Secondary | ICD-10-CM | POA: Diagnosis not present

## 2018-02-21 DIAGNOSIS — C7801 Secondary malignant neoplasm of right lung: Secondary | ICD-10-CM | POA: Diagnosis not present

## 2018-02-21 DIAGNOSIS — C50919 Malignant neoplasm of unspecified site of unspecified female breast: Secondary | ICD-10-CM | POA: Diagnosis not present

## 2018-02-21 NOTE — Telephone Encounter (Signed)
Faxed ROI to [facility] on 02/21/18, Release ID [id number]

## 2018-02-24 DIAGNOSIS — C50919 Malignant neoplasm of unspecified site of unspecified female breast: Secondary | ICD-10-CM | POA: Diagnosis not present

## 2018-02-24 DIAGNOSIS — E118 Type 2 diabetes mellitus with unspecified complications: Secondary | ICD-10-CM | POA: Diagnosis not present

## 2018-02-24 DIAGNOSIS — J449 Chronic obstructive pulmonary disease, unspecified: Secondary | ICD-10-CM | POA: Diagnosis not present

## 2018-02-24 DIAGNOSIS — I10 Essential (primary) hypertension: Secondary | ICD-10-CM | POA: Diagnosis not present

## 2018-03-15 DEATH — deceased

## 2018-10-27 ENCOUNTER — Ambulatory Visit: Payer: Self-pay | Admitting: Hematology and Oncology

## 2019-04-20 IMAGING — CT NM PET TUM IMG INITIAL (PI) SKULL BASE T - THIGH
1 of 8 series · 1 of 25 positions shown · non-contrast
Comparison: None.

CLINICAL DATA: Initial treatment strategy for breast cancer.

EXAM:
NUCLEAR MEDICINE PET SKULL BASE TO THIGH
TECHNIQUE: mCi F-18 FDG was injected intravenously. Full-ring PET imaging was
performed from the skull base to thigh after the radiotracer. CT
data was obtained and used for attenuation correction and anatomic
localization.
FASTING BLOOD GLUCOSE:  Value:  mg/dl

[Series 4: ct sk_thigh 5.0 b31f · axial · 5.0mm · 0.98mm/px · 1 of 219 slices shown]
[im 219/219  brain]
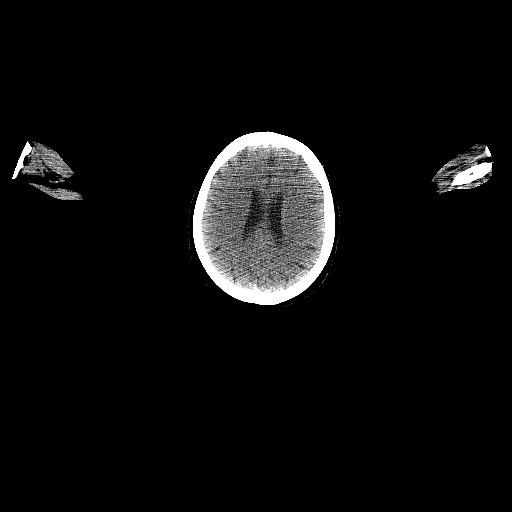

[1 of 25 positions shown; findings below may reference images not displayed]

FINDINGS: NECK: No hypermetabolic lymph nodes in the neck. Asymmetric
enlargement right thyroid gland without hypermetabolism.

CHEST:

Bilateral pulmonary metastases are evident. 10 mm metastatic lesion
left upper lobe (image 19 series 8) demonstrates SUV max = 16.1.
Dominant left lower lobe mass measuring 3.8 cm demonstrates SUV max
= 10.8. 3.1 x 2.9 cm lesion in the mediastinum adjacent to the right
atrium is hypermetabolic.

Heart is enlarged. Coronary artery calcification is evident.
Atherosclerotic calcification is noted in the wall of the thoracic
aorta. Enlargement of the pulmonary arteries suggests pulmonary
arterial hypertension.

ABDOMEN/PELVIS: Large lesion in the medial liver, replacing the
caudate lobe measures about 9 cm. This is hypermetabolic with SUV
max = 13.4. The retroperitoneal lymphadenopathy is hypermetabolic
with index 2.6 cm short axis left para-aortic lymph node (image 123
series 4) demonstrating SUV max = 9.5.

Gallbladder surgically absent. Nonobstructing stone interpolar left
kidney. Bilateral probable renal cysts. There is abdominal aortic
atherosclerosis without aneurysm.

SKELETON: Mottled FDG accumulation without definite bony metastatic
disease.
IMPRESSION: 1. Hypermetabolic metastases identified in the lungs, mediastinum,
liver, and upper abdominal lymph nodes.
2.  Aortic Atherosclerois (DYRAG-170.0)
3. Enlargement of the pulmonary arteries raises the question of
pulmonary arterial hypertension.
